# Patient Record
Sex: Female | Born: 1935 | Race: White | Hispanic: No | Smoking: Never smoker
Health system: Southern US, Community
[De-identification: ages and names within clinical notes are randomized; demographics above are authoritative.]

## PROBLEM LIST (undated history)

## (undated) DIAGNOSIS — I10 Essential (primary) hypertension: Secondary | ICD-10-CM

## (undated) DIAGNOSIS — H5440 Blindness, one eye, unspecified eye: Secondary | ICD-10-CM

## (undated) DIAGNOSIS — I639 Cerebral infarction, unspecified: Secondary | ICD-10-CM

## (undated) DIAGNOSIS — E785 Hyperlipidemia, unspecified: Secondary | ICD-10-CM

## (undated) DIAGNOSIS — G629 Polyneuropathy, unspecified: Secondary | ICD-10-CM

## (undated) HISTORY — PX: SHOULDER SURGERY: SHX246

## (undated) HISTORY — PX: EYE SURGERY: SHX253

---

## 2007-10-05 ENCOUNTER — Inpatient Hospital Stay (HOSPITAL_COMMUNITY): Admission: EM | Admit: 2007-10-05 | Discharge: 2007-10-09 | Payer: Self-pay | Admitting: Emergency Medicine

## 2007-10-06 ENCOUNTER — Encounter (INDEPENDENT_AMBULATORY_CARE_PROVIDER_SITE_OTHER): Payer: Self-pay | Admitting: Pulmonary Disease

## 2007-10-09 ENCOUNTER — Inpatient Hospital Stay: Admission: AD | Admit: 2007-10-09 | Discharge: 2008-01-20 | Payer: Self-pay | Admitting: Pulmonary Disease

## 2007-11-21 ENCOUNTER — Ambulatory Visit (HOSPITAL_COMMUNITY): Admission: RE | Admit: 2007-11-21 | Discharge: 2007-11-21 | Payer: Self-pay | Admitting: Pulmonary Disease

## 2008-02-27 ENCOUNTER — Encounter (HOSPITAL_COMMUNITY): Admission: RE | Admit: 2008-02-27 | Discharge: 2008-03-21 | Payer: Self-pay | Admitting: Pulmonary Disease

## 2008-03-25 ENCOUNTER — Encounter (HOSPITAL_COMMUNITY): Admission: RE | Admit: 2008-03-25 | Discharge: 2008-04-24 | Payer: Self-pay | Admitting: Pulmonary Disease

## 2008-04-25 ENCOUNTER — Encounter (HOSPITAL_COMMUNITY): Admission: RE | Admit: 2008-04-25 | Discharge: 2008-05-25 | Payer: Self-pay | Admitting: Pulmonary Disease

## 2008-05-07 ENCOUNTER — Emergency Department (HOSPITAL_COMMUNITY): Admission: EM | Admit: 2008-05-07 | Discharge: 2008-05-07 | Payer: Self-pay | Admitting: Emergency Medicine

## 2008-05-28 ENCOUNTER — Encounter (HOSPITAL_COMMUNITY): Admission: RE | Admit: 2008-05-28 | Discharge: 2008-06-27 | Payer: Self-pay | Admitting: Pulmonary Disease

## 2009-11-19 ENCOUNTER — Emergency Department (HOSPITAL_COMMUNITY)
Admission: EM | Admit: 2009-11-19 | Discharge: 2009-11-19 | Payer: Self-pay | Source: Home / Self Care | Admitting: Emergency Medicine

## 2010-08-04 NOTE — Group Therapy Note (Signed)
NAME:  Carla Bonilla, Carla Bonilla             ACCOUNT NO.:  [id number]   MEDICAL RECORD NO.:  [id number]          PATIENT TYPE:  INP   LOCATION:  A332                          FACILITY:  [facility]   PHYSICIAN:  Edward L. Juanetta Gosling, M.D.DATE OF BIRTH:  [birth year]   DATE OF PROCEDURE:  DATE OF DISCHARGE:                                 PROGRESS NOTE   Ms. Rone was admitted yesterday with a stroke.  She has got  basically expressive aphasia.  She clearly is able to understand.  She  gestures but she cannot speak except to make nonspecific noises.  Her  exam this morning shows temperature is 98.2, pulse 80, respirations 18,  and blood pressure 188/82.  She has not had her blood pressure  medication as yet.  Her weight 72 kg, and height 65 inches.  Her chest  is clear.  She still has some facial asymmetry.  She has a little bit of  weakness of the right side but not much and she still aphasic.  Dr.  Gerilyn Pilgrim seen her and ordered MRI, MRA and she is going to have PT,  speech, and language evaluations.  I have discussed her situation with  her niece who is her closest relative that the plan would be for her to  recover here and work with PT etc. and then trying to get her into a  Skilled Care Facility or Rehab Center.      Edward L. Juanetta Gosling, M.D.  Electronically Signed     ELH/MEDQ  D:  10/06/2007  T:  10/06/2007  Job:  [id number]

## 2010-08-04 NOTE — Consult Note (Signed)
NAME:  Carla Bonilla, Carla Bonilla             ACCOUNT NO.:  [id number]   MEDICAL RECORD NO.:  [id number]          PATIENT TYPE:  INP   LOCATION:  A332                          FACILITY:  [facility]   PHYSICIAN:  Kofi A. Gerilyn Pilgrim, M.D. DATE OF BIRTH:  Jan 13, 1921   DATE OF CONSULTATION:  10/05/2007  DATE OF DISCHARGE:                                 CONSULTATION   PRIORITY NEUROLOGICAL CONSULTATION   REASON FOR CONSULTATION:  Stroke.   HISTORY OF PRESENT ILLNESS:  The patient is an 77 year old black female,  who has no past medical history other than marked glaucoma.  The patient  was last seen about 11 p.m. by a friend.  She was noted to be at  baseline normal with a normal cognition and ambulating.  On calling the  patient this morning, she was noted to be clearly having problems  speaking and this was about 10 a.m.  On arrival to the house, the friend  noticed that the patient had facial drooping on the right and was  subsequently taken to the emergency room.   PAST MEDICAL HISTORY:  Glaucoma; she is legally blind in her left eye.  Apparently she saw doctors, I believe, at [facility] for her glaucoma  problems.  No other past medical history.   MEDICATIONS:  1. Lopressor 50 mg once a day.  2. Acetazolamide 250 mg b.i.d.  3. Eye drops the right eye b.i.d.  4. Vigamox opthalmic solution 0.5% into the left eye b.i.d.   REVIEW OF SYSTEMS:  Limited given a profound speaking impairment, but at  baseline she functions fairly well and essentially per the friend the  review of systems is negative.  She does apparently have some problems  walking around due to knee pain at times.   SOCIAL HISTORY:  Lives by herself.  She has some nieces and nephews in  [place], but otherwise no other family members, no children.  No tobacco  use, no alcohol use, and no illicit drug use.   PAST SURGICAL HISTORY:  Unknown.   ALLERGIES:  No known drug allergies.   PHYSICAL EXAMINATION:  GENERAL:  She was a pleasant,  moderately  overweight lady in no acute distress.  VITAL SIGNS:  Temperature 97.4, pulse 66, respirations 18, blood  pressure 161/86.  HEENT:  Patient has corneal clouding on the left eye, pupils are both  irregularly shaped and large, 6 mm, and both are nonreactive.  Head is  normocephalic, atraumatic.  NECK:  Supple.  ABDOMEN:  Soft.  EXTREMITIES:  No significant edema.  MENTATION:  She is awake and alert.  She does follow midline commands  only.  She has a clear global aphasia involving both comprehension and  expression.  She essentially is mute.  CRANIAL NERVE EVALUATION:  Again, pupils are irregularly shaped, large,  and nonreactive.  She does have full extraocular movements, and no  nystagmus is noted.  She has right lower facial weakness.  Tongue is  midline. Visual fields are unreliable, but I do not really see any clear  deficits there.  MOTOR EXAMINATION:  She has a pronator drift on the right  side with  increased tone involving the right upper extremity.  She seems to have  normal tone, bulk, and strength involving the left side.  Reflexes are  brisk on the right side, although plantar.  Reflexes are both flexor.  Coordination shows no tremor.  There is no past-pointing or dysmetria.   CT scan of the brain shows nothing acute.  There is a hyperdense sign  involving the left MCA suggestive of an acute left MCA infarct.  There  is a subcortical periventricular hypodensity indicating chronic ischemic  changes.   WBC 8.6, hemoglobin 14, platelet count of 208.  Urinalysis negative.  Sodium 141, potassium 3.6, chloride of 114, CO2 21, glucose 148, BUN 12,  creatinine 1, calcium 9.2.   IMPRESSION:  Acute left middle cerebral artery infarct involving the  frontotemporal region; in particular, the operculum given the severe  language impairment.   RECOMMENDATIONS:  Continue with aspirin and Plavix, although she will  not need both of these in the long term.  This only  increases the risk  of bleeding without significant benefit.  She probably can be discharged  home on one, preferably the aspirin.  We are going to start her on DVT  prevention in the way of compression stockings.  We are also going to do  an MRI and MRA of the brain.  She already has speech and echo ordered,  and a carotid is also ordered, which I think came back negative, there  is no significant stenosis.  Thanks for this consultation.      Kofi A. Gerilyn Pilgrim, M.D.  Electronically Signed     KAD/MEDQ  D:  10/05/2007  T:  10/05/2007  Job:  [id number]

## 2010-08-04 NOTE — Group Therapy Note (Signed)
NAME:  Carla Bonilla, Carla Bonilla             ACCOUNT NO.:  [id number]   MEDICAL RECORD NO.:  [id number]          PATIENT TYPE:  INP   LOCATION:  A332                          FACILITY:  [facility]   PHYSICIAN:  Kofi A. Gerilyn Pilgrim, M.D. DATE OF BIRTH:  1920/11/03   DATE OF PROCEDURE:  DATE OF DISCHARGE:                                 PROGRESS NOTE   PHYSICAL EXAMINATION:  Temperature 98.4, pulse 63, respirations 20,  blood pressure 160/76.  Patient is awake and alert, sitting up in the bedroom.  She does follow  midline commands but still is severely impaired language-wise.  She  still has significant problems following commands on most things.  She  essentially is mute.  She has flattening of the nasolabial fold on the  right with mild right pronator drift.   MRI of the brain shows mild-to-moderate intracranial occlusive disease  involving the MCA bilaterally.  There is acute left MCA infarct  involving the left frontal temporoparietal region.  There is also  bilateral small amount of diffusion hyperintensities seen at the  occipital regions.   ASSESSMENT:  Acute left middle cerebral artery infarction.  There is  some suggestion of small bioccipital infarctions suggestive of coronary  embolic phenomenon.  I do not see an echocardiogram report as yet.  Will  follow that up.  Continue with aspirin and Plavix again until an echo  indicates a cardioembolic stroke, then antiplatelet agents should be  given.  I would suggest using only one antiplatelet agent long-term.  Will continue to follow the echo report and continue her current care.      Kofi A. Gerilyn Pilgrim, M.D.  Electronically Signed     KAD/MEDQ  D:  10/06/2007  T:  10/06/2007  Job:  [id number]

## 2010-08-04 NOTE — Group Therapy Note (Signed)
NAME:  Carla Bonilla, Carla Bonilla             ACCOUNT NO.:  [id number]   MEDICAL RECORD NO.:  [id number]         PATIENT TYPE:  PORB   LOCATION:  S115                          FACILITY:  [facility]   PHYSICIAN:  Edward L. Juanetta Gosling, M.D.DATE OF BIRTH:  [birth year]   DATE OF PROCEDURE:  DATE OF DISCHARGE:                                 PROGRESS NOTE   Carla Bonilla has overall much improved.  She is able to speak and I am  able to understand more of her words.  She has some skin tears on her  arm, but those seem to be a little bit better.  She has no other new  complaints and no other new problems.  Overall, I think she is doing  much better.   ASSESSMENT:  She is improved.   PLAN:  Plan is to continue with her treatments and medications.  Continue with her therapy.  She does seem to be improving.  I do not  plan to change anything and hopefully, she will get to the point where  she is going to be able to live independently again, but I am not sure  if this is going to happen.      Edward L. Juanetta Gosling, M.D.  Electronically Signed     ELH/MEDQ  D:  12/30/2007  T:  12/30/2007  Job:  [id number]

## 2010-08-04 NOTE — H&P (Signed)
NAME:  Carla Bonilla, Carla Bonilla             ACCOUNT NO.:  [id number]   MEDICAL RECORD NO.:  [id number]          PATIENT TYPE:  INP   LOCATION:  A332                          FACILITY:  [facility]   PHYSICIAN:  Edward L. Juanetta Gosling, M.D.DATE OF BIRTH:  08-08-1920   DATE OF ADMISSION:  10/05/2007  DATE OF DISCHARGE:  LH                              HISTORY & PHYSICAL   REASON FOR ADMISSION:  Stroke.   HISTORY:  Carla Bonilla is an 77 year old who had been in her usual  state of fairly good health, had been out moving around as normal during  the day yesterday, was last seen or was last heard from by a family  friend about 10 o'clock on the night of October 04, 2007, and then this  morning was found to be very sluggish.  Her speech was slurred, and she  was brought to the Emergency Room where she was found to have what  appears to be a stroke.  She has not had anything like this previously  and has generally been in good health.  She has hypertension and she has  problems with her left eye.  She has been blind in that left eye for  many years and she has glaucoma in the right eye.  She has noted that  she has weakness on the right side of her face.  She is aphasic.  She  seems to be able to understand, but she is not able to speak.   Her past medical history is positive for glaucoma, positive for  hypertension.  Surgically, she has had surgery on her eye.   SOCIAL HISTORY:  She does not smoke.  She does not use any alcohol.  She  does not use any illicit drugs and she is a widow.   Her family history is not positive for any sort of strokes as far as is  known.   Her current medications are:  1. Lopressor 50 mg daily.  2. Acetazolamide 250 mg b.i.d.  3. Timolol 0.5%, 1 drop twice a day in the right eye.  4. Vigamox 0.5%, 1 drop twice a day in the eye.   Her review of systems except as mentioned is negative.   Physical exam shows that she is awake and alert.  She does have facial  asymmetry.  She  is not able to speak, but she can make sounds.  Her  temperature is 97.4, pulse 68, respirations 18, blood pressure 161/86,  and O2 sat 97% on room air.  Her height 65 inches and weight 72 kg.  Her  pupils, right pupil is reactive.  Her nose and throat are clear.  Her  neck is supple.  She does not have any jugular venous distention.  She  does not have any bruits.  Her chest is clear without wheezes, rales, or  rhonchi.  Her heart is regular without murmur, gallop, or rub.  Her  abdomen is soft.  No masses felt.  Extremities showed no edema.  Central  nervous system exam shows that she is unable to communicate verbally.  She has perhaps some decreased strength  in the right arm and in the  right leg, but she cannot move all 4 extremities on command.  She has a  poor gag reflex, but does have a gag reflex.   Electrocardiogram done showed apparently sinus rhythm, nonspecific ST-T  wave changes and her lab work.  CBC shows white count 8600, hemoglobin  14.3, platelets 208, PT 12.5, INR 0.9, metabolic profile shows her  glucose is 148, BUN 20, and creatinine 1.03.  Liver function is normal.  Urine showed 3-6 white cells and 0-2 red cells.  She has a Foley  catheter in place and she is having some blood tinged urine now.  She  had CT of the brain which showed diffuse brain atrophy, chronic  microvascular ischemic changes, and a left middle cerebral artery  infarction.  Carotid Doppler, she has minimal atherosclerotic change.   ASSESSMENT:  She has had a stroke and clearly we are going to need to do  neuro checks, watch her carefully, Dr.  Gerilyn Pilgrim will see her.  She is  going to have PT, OT, and speech consultations and she is going to be  followed closely.  I am going to get neuro checks every 4 hours for the  first 24 hours and then follow.      Edward L. Juanetta Gosling, M.D.  Electronically Signed     ELH/MEDQ  D:  10/05/2007  T:  10/06/2007  Job:  [id number]

## 2010-08-04 NOTE — Group Therapy Note (Signed)
NAME:  Carla Bonilla, Carla Bonilla             ACCOUNT NO.:  [id number]   MEDICAL RECORD NO.:  [id number]          PATIENT TYPE:  INP   LOCATION:  A332                          FACILITY:  [facility]   PHYSICIAN:  Kofi A. Gerilyn Pilgrim, M.D. DATE OF BIRTH:  Sep 15, 1920   DATE OF PROCEDURE:  10/09/2007  DATE OF DISCHARGE:                                 PROGRESS NOTE   The family is here, and did have a conversation with them.  They did  report some improved neurological function.  They report that she was  able over the last couple of days to write her name which I did observe.  Temperature 98, pulse 71, respiratory rate 20, blood pressure 116/67.  The patient is awake.  She actually is more responsive, and does follow  appendicular commands bilaterally.  She does seem to have some  right/left confusion, however, which is not unusual, given her left  parietal central infarct.  She has anti-gravity  strength bilaterally.  She continues to have flattening of the nasolabial fold on the right.   ASSESSMENT AND PLAN:  Large left frontoparietal infarct.  Continue with  the current therapy including antiplatelet agents and occupational,  physical, and speech therapy.  Again I think we should DC the Plavix  after a month, and she should continue with only a single antiplatelet  agent.      Kofi A. Gerilyn Pilgrim, M.D.  Electronically Signed     KAD/MEDQ  D:  10/09/2007  T:  10/09/2007  Job:  [id number]

## 2010-08-04 NOTE — H&P (Signed)
NAME:  Carla Bonilla, Carla Bonilla             ACCOUNT NO.:  [id number]   MEDICAL RECORD NO.:  [id number]          PATIENT TYPE:  ORB   LOCATION:  S115                          FACILITY:  [facility]   PHYSICIAN:  Edward L. Juanetta Gosling, M.D.DATE OF BIRTH:  Jul 20, 1920   DATE OF ADMISSION:  10/09/2007  DATE OF DISCHARGE:  LH                              HISTORY & PHYSICAL   REASON FOR ADMISSION/HISTORY:  Ms. Brucato is an 77 year old who had  been in her usual state of fairly good health on October 05, 2007, when she  had change in mental status and was brought to the emergency room.  Her  speech was slurred, she fell, she was very weak on the right side.  She  was brought to the emergency room where she was found to be aphasic, had  weakness on the right side of her face, weakness on the right side of  her body.  This was an expressive aphasia.  She does not have receptive  aphasia.  She was treated at the hospital, showed some improvement and  is brought to [facility] for rehabilitation.   PAST MEDICAL HISTORY:  1. Glaucoma.  2. Hypertension.  3. Surgically on her eye several times.  She is blind in the left eye.   FAMILY HISTORY:  As far as is known is not positive for strokes.   REVIEW OF SYSTEMS:  Except as mentioned is negative.   PHYSICAL EXAMINATION:  GENERAL:  Shows that she is alert and awake.  She  is trying to express herself with gesture.  She can make a few words.  Her weakness on the right is better.  She still has facial asymmetry.  VITAL SIGNS:  As recorded.  HEENT:  Mucous membranes are moist.  NECK:  Supple without masses.  CHEST:  Fairly clear without wheezes, rales, or rhonchi.  HEART:  Regular without murmur, gallop, or rub.  ABDOMEN:  Soft without masses.  EXTREMITIES:  No edema.  CNS:  She has very poor verbal communication skills.  She still has some  right-sided facial asymmetry.  The weakness in her right leg is better.   ASSESSMENT:  She has got multiple medical  problems, most recently a  stroke.  She does seem to be improving.  She is not ready for discharge  yet.      Edward L. Juanetta Gosling, M.D.  Electronically Signed     ELH/MEDQ  D:  10/16/2007  T:  10/16/2007  Job:  [id number]

## 2010-08-04 NOTE — Group Therapy Note (Signed)
NAME:  Carla, Bonilla             ACCOUNT NO.:  [id number]   MEDICAL RECORD NO.:  [id number]          PATIENT TYPE:  ORB   LOCATION:  S115                          FACILITY:  [facility]   PHYSICIAN:  Edward L. Juanetta Gosling, M.D.DATE OF BIRTH:  04/09/1920   DATE OF PROCEDURE:  DATE OF DISCHARGE:                                 PROGRESS NOTE   Carla Bonilla is much improved.  She still has facial asymmetry and her  speech, although still difficult to understand, is much better than  before.  She is able to communicate and she does use some short  sentences that we are able to understand now.  She has no other new  complaints noted.   PHYSICAL EXAMINATION:  GENERAL:  She is awake.  She is alert.  She is  sitting up.  HEENT:  She does have facial asymmetry.  Her speech is difficult but  understandable.  HEART:  Regular.  CHEST:  Clear.  ABDOMEN:  Soft.  EXTREMITIES:  No edema.  CNS:  Clearly she can understand, although she has some difficulty still  with communication.   ASSESSMENT:  She has had a serious stroke, but she seems to be  improving.  She has expressive aphasia, which is improving and my plan  then is to continue with her treatments and medications and follow.  I  do not plan to change any meds today.  She is going to continue with her  speech therapy, etc. and follow.      Edward L. Juanetta Gosling, M.D.  Electronically Signed     ELH/MEDQ  D:  11/04/2007  T:  11/05/2007  Job:  [id number]

## 2010-08-04 NOTE — Group Therapy Note (Signed)
NAME:  ALEGRIA, DOMINIQUE             ACCOUNT NO.:  [id number]   MEDICAL RECORD NO.:  [id number]          PATIENT TYPE:  ORB   LOCATION:  S115                          FACILITY:  [facility]   PHYSICIAN:  Edward L. Juanetta Gosling, M.D.DATE OF BIRTH:  05-Aug-1920   DATE OF PROCEDURE:  DATE OF DISCHARGE:                                 PROGRESS NOTE   The patient at the [facility].  Ms. Odis Luster wood is overall doing well.  She is participating in activities and her speech continues to improve,  although she still has some speech impediment.  She still has some  asymmetry of her face.   Her physical examination this morning shows that she is awake and alert.  She is able to converse.  Her vital signs as recorded.  Her nose and  throat are clear.  Her mucous membranes are moist.   ASSESSMENT:  She has had a severe stroke which is left with problems  with speech, but basically she is doing quite well.  I do not plan to  change any of her treatments.  She is continuing to work with PT, OT,  and speech therapy and continues to improve.      Edward L. Juanetta Gosling, M.D.  Electronically Signed     ELH/MEDQ  D:  12/02/2007  T:  12/02/2007  Job:  [id number]

## 2010-08-04 NOTE — Discharge Summary (Signed)
NAME:  Carla Bonilla, Carla Bonilla             ACCOUNT NO.:  [id number]   MEDICAL RECORD NO.:  [id number]          PATIENT TYPE:  INP   LOCATION:  A332                          FACILITY:  [facility]   PHYSICIAN:  Kingsley Callander. Ouida Sills, MD       DATE OF BIRTH:  05/12/1920   DATE OF ADMISSION:  10/05/2007  DATE OF DISCHARGE:  LH                               DISCHARGE SUMMARY   DISCHARGE DIAGNOSES:  1. Stroke.  2. Hypertension.  3. Glaucoma.   HOSPITAL COURSE:  This patient is an 77 year old female who presented  with sluggishness, slurred speech, and right-sided weakness.  She was in  normal sinus rhythm.  Her initial BP was 161/86.  A CT and MRI revealed  a left middle cerebral artery distribution stroke.  There are also  occipital changes raising a suspicion of an embolic phenomenon.  Her  echo, though, revealed no clot and no wall motion abnormalities.  LV  function was normal.  She remained in sinus rhythm.  She was seen in  neurology consultation by Dr. Gerilyn Pilgrim.  She was treated with aspirin  and Plavix.  Zocor and Altace were added.   She is aphasic.  She has been evaluated treated with speech therapy.  Medications have been crushed in puree.  She was found to be a silent  aspirator of thin liquids.  Her diet will be nectar thickened liquids  and pureed foods.   Her carotid ultrasound revealed no significant stenosis.  She has a  persistent right facial droop and diminished strength in the right upper  extremity.   Her cholesterol was 159 with an LDL of 106.   She was stable and arrangements have been made for transfer to the [facility].  She will continue physical therapy and speech therapy there.   DISCHARGE MEDICATIONS:  1. Aspirin 81 mg daily.  2. Plavix 75 mg daily.  3. Altace 2.5 mg daily.  4. Zocor 20 mg nightly.  5. Lopressor 25 mg b.i.d.  6. Acetazolamide 250 mg twice a day.  7. Timolol 0.5% 1 drop twice a day in the right eye.  8. Vigamox 0.5% 1 drop in the left eye twice  dail    Please check a met7 in 1 week.      Kingsley Callander. Ouida Sills, MD  Electronically Signed     ROF/MEDQ  D:  10/09/2007  T:  10/09/2007  Job:  [id number]   cc:   Ramon Dredge L. Juanetta Gosling, M.D.  Fax: [phone]

## 2010-11-20 ENCOUNTER — Emergency Department (HOSPITAL_COMMUNITY): Payer: Medicare Other

## 2010-11-20 ENCOUNTER — Inpatient Hospital Stay (HOSPITAL_COMMUNITY)
Admission: EM | Admit: 2010-11-20 | Discharge: 2010-11-27 | DRG: 494 | Disposition: A | Discharge status: Skilled Nursing Facility | Payer: Medicare Other | Attending: Pulmonary Disease | Admitting: Pulmonary Disease

## 2010-11-20 DIAGNOSIS — I10 Essential (primary) hypertension: Secondary | ICD-10-CM | POA: Diagnosis present

## 2010-11-20 DIAGNOSIS — W19XXXA Unspecified fall, initial encounter: Secondary | ICD-10-CM | POA: Diagnosis present

## 2010-11-20 DIAGNOSIS — S42401A Unspecified fracture of lower end of right humerus, initial encounter for closed fracture: Secondary | ICD-10-CM | POA: Diagnosis present

## 2010-11-20 DIAGNOSIS — H409 Unspecified glaucoma: Secondary | ICD-10-CM | POA: Diagnosis present

## 2010-11-20 DIAGNOSIS — I6930 Unspecified sequelae of cerebral infarction: Secondary | ICD-10-CM

## 2010-11-20 DIAGNOSIS — S42309A Unspecified fracture of shaft of humerus, unspecified arm, initial encounter for closed fracture: Secondary | ICD-10-CM

## 2010-11-20 DIAGNOSIS — D500 Iron deficiency anemia secondary to blood loss (chronic): Secondary | ICD-10-CM | POA: Diagnosis not present

## 2010-11-20 DIAGNOSIS — T148XXA Other injury of unspecified body region, initial encounter: Secondary | ICD-10-CM

## 2010-11-20 DIAGNOSIS — S42293A Other displaced fracture of upper end of unspecified humerus, initial encounter for closed fracture: Principal | ICD-10-CM | POA: Diagnosis present

## 2010-11-20 DIAGNOSIS — I69928 Other speech and language deficits following unspecified cerebrovascular disease: Secondary | ICD-10-CM

## 2010-11-20 HISTORY — DX: Blindness, one eye, unspecified eye: H54.40

## 2010-11-20 HISTORY — DX: Cerebral infarction, unspecified: I63.9

## 2010-11-20 HISTORY — DX: Essential (primary) hypertension: I10

## 2010-11-20 LAB — CBC
MCH: 31.5 pg (ref 26.0–34.0)
MCV: 94.1 fL (ref 78.0–100.0)
Platelets: 271 10*3/uL (ref 150–400)
RDW: 14.1 % (ref 11.5–15.5)
WBC: 23.7 10*3/uL — ABNORMAL HIGH (ref 4.0–10.5)

## 2010-11-20 LAB — URINALYSIS, ROUTINE W REFLEX MICROSCOPIC
Ketones, ur: NEGATIVE mg/dL
Leukocytes, UA: NEGATIVE
Nitrite: NEGATIVE
Protein, ur: NEGATIVE mg/dL

## 2010-11-20 LAB — BASIC METABOLIC PANEL
Calcium: 9.2 mg/dL (ref 8.4–10.5)
Chloride: 103 mEq/L (ref 96–112)
Creatinine, Ser: 0.85 mg/dL (ref 0.50–1.10)
GFR calc Af Amer: >60 mL/min (ref >60)

## 2010-11-20 MED ORDER — METOPROLOL TARTRATE 25 MG PO TABS
25.0000 mg | ORAL_TABLET | Freq: Two times a day (BID) | ORAL | Status: DC
  Administered 2010-11-20 – 2010-11-27 (×13): 25 mg via ORAL
  Filled 2010-11-20 (×14): qty 1

## 2010-11-20 MED ORDER — TIMOLOL MALEATE 0.25 % OP SOLN
1.0000 [drp] | Freq: Two times a day (BID) | OPHTHALMIC | Status: DC
  Administered 2010-11-21 – 2010-11-27 (×12): 1 [drp] via OPHTHALMIC
  Filled 2010-11-20: qty 5

## 2010-11-20 MED ORDER — AMLODIPINE BESYLATE 5 MG PO TABS: 5.0000 mg | ORAL_TABLET | Freq: Every day | ORAL | Status: DC

## 2010-11-20 MED ORDER — SODIUM CHLORIDE 0.9 % IJ SOLN
INTRAMUSCULAR | Status: AC
  Administered 2010-11-20: 3 mL
  Filled 2010-11-20: qty 3

## 2010-11-20 MED ORDER — ONDANSETRON HCL 4 MG PO TABS
4.0000 mg | ORAL_TABLET | Freq: Once | ORAL | Status: AC
  Administered 2010-11-20: 4 mg via ORAL
  Filled 2010-11-20: qty 1

## 2010-11-20 MED ORDER — PNEUMOCOCCAL VAC POLYVALENT 25 MCG/0.5ML IJ INJ
0.5000 mL | INJECTION | INTRAMUSCULAR | Status: AC
  Administered 2010-11-21: 0.5 mL via INTRAMUSCULAR
  Filled 2010-11-20: qty 0.5

## 2010-11-20 MED ORDER — MOXIFLOXACIN HCL 0.5 % OP SOLN
1.0000 [drp] | Freq: Two times a day (BID) | OPHTHALMIC | Status: DC
  Filled 2010-11-20: qty 3

## 2010-11-20 MED ORDER — OXYCODONE-ACETAMINOPHEN 5-325 MG PO TABS
1.0000 | ORAL_TABLET | Freq: Once | ORAL | Status: AC
  Administered 2010-11-20: 1 via ORAL
  Filled 2010-11-20: qty 1

## 2010-11-20 MED ORDER — AMLODIPINE BESYLATE 5 MG PO TABS: 20.0000 mg | ORAL_TABLET | Freq: Every day | ORAL | Status: DC

## 2010-11-20 MED ORDER — MORPHINE SULFATE 2 MG/ML IJ SOLN
1.0000 mg | INTRAMUSCULAR | Status: DC | PRN
  Administered 2010-11-20 – 2010-11-23 (×2): 1 mg via INTRAVENOUS
  Filled 2010-11-20 (×2): qty 1

## 2010-11-20 NOTE — ED Notes (Signed)
Patient is an elderly female with a history of stroke affecting her right side who fell this morning falling on her right shoulder. This was acute in onset, associated with transfer of her weight while turning around while dressing. She was unable to get up off the floor causing family to call EMS to help with transport to the hospital. She denies hitting her head, denies neck pain, denies back pain, denies leg pain other than her chronic right lower extremity pain. She notes that her right upper extremity has been swollen, tender and worse with movement.  Physical exam: Patient is well appearing, no trauma to the head or the neck, no tenderness to palpation over her cervical, thoracic, lumbar spines. She does have tenderness to her right shoulder and with range of motion of the shoulder. There is swelling of the proximal humeral area. There is normal range of motion of the elbow, forearm, wrist on the right. She does have a skin tear on the denser surface of the forearm which is very superficial and not amenable to laceration repair. Mental status is normal, sensation normal, motor normal or baseline.  Assessment, 77 year old with likely humerus fracture.  Plan: Evaluation with x-rays, labs, anticipate immobilization with a sling.  Vida Roller, MD 11/20/10 [phone]

## 2010-11-20 NOTE — Progress Notes (Signed)
Medical screening examination/treatment/procedure(s) were conducted as a shared visit with non-physician practitioner(s) and myself.  I personally evaluated the patient during the encounter  

## 2010-11-20 NOTE — ED Notes (Signed)
Per ems, pt off of her bed and landed on her rt arm.  Pt denies any LOC, or head injury.  Pt is alert and oriented

## 2010-11-20 NOTE — ED Notes (Signed)
Called and gave report to RN on 300

## 2010-11-20 NOTE — Consult Note (Signed)
Reason for Consult:right humerus fracture Referring Physician: ER/PA Ivery Quale  Carla Bonilla is an 77 y.o. female.  HPI: 89 HTN, S/P CVA WITH RIGHT SIDED LOWER EXTREMITY HEMIPARESIS, POOR BALANCE, AMBULATES WITH WALKER, FELL FRACTURED RIGHT HUMERUS PROXIMALLY, COMMINUTED, AXIALLY ALIGNED REASONABLY WITH SEPARATION OF FRAGMENTS  Past Medical History  Diagnosis Date  . CVA (cerebral vascular accident)   . Hypertension   . Blind left eye   . Glaucoma   . Hearing loss     History reviewed. No pertinent past surgical history.  No family history on file.  Social History:  reports that she has never smoked. She does not have any smokeless tobacco history on file. She reports that she does not drink alcohol or use illicit drugs.  Allergies: Not on File  Medications: I have reviewed the patient's current medications.  Results for orders placed during the hospital encounter of 11/20/10 (from the past 48 hour(s))  CBC     Status: Abnormal   Collection Time   11/20/10  1:29 PM      Component Value Range Comment   WBC 23.7 (*) 4.0 - 10.5 (K/uL)    RBC 3.87  3.87 - 5.11 (MIL/uL)    Hemoglobin 12.2  12.0 - 15.0 (g/dL)    HCT 16.1  09.6 - 04.5 (%)    MCV 94.1  78.0 - 100.0 (fL)    MCH 31.5  26.0 - 34.0 (pg)    MCHC 33.5  30.0 - 36.0 (g/dL)    RDW 40.9  81.1 - 91.4 (%)    Platelets 271  150 - 400 (K/uL)   BASIC METABOLIC PANEL     Status: Abnormal   Collection Time   11/20/10  1:29 PM      Component Value Range Comment   Sodium 139  135 - 145 (mEq/L)    Potassium 4.4  3.5 - 5.1 (mEq/L)    Chloride 103  96 - 112 (mEq/L)    CO2 29  19 - 32 (mEq/L)    Glucose, Bld 154 (*) 70 - 99 (mg/dL)    BUN 31 (*) 6 - 23 (mg/dL)    Creatinine, Ser 7.82  0.50 - 1.10 (mg/dL)    Calcium 9.2  8.4 - 10.5 (mg/dL)    GFR calc non Af Amer >60  >60 (mL/min)    GFR calc Af Amer >60  >60 (mL/min)   URINALYSIS, ROUTINE W REFLEX MICROSCOPIC     Status: Normal   Collection Time   11/20/10  3:05 PM    Component Value Range Comment   Color, Urine YELLOW  YELLOW     Appearance CLEAR  CLEAR     Specific Gravity, Urine 1.025  1.005 - 1.030     pH 6.5  5.0 - 8.0     Glucose, UA NEGATIVE  NEGATIVE (mg/dL)    Hgb urine dipstick NEGATIVE  NEGATIVE     Bilirubin Urine NEGATIVE  NEGATIVE     Ketones, ur NEGATIVE  NEGATIVE (mg/dL)    Protein, ur NEGATIVE  NEGATIVE (mg/dL)    Urobilinogen, UA 0.2  0.0 - 1.0 (mg/dL)    Nitrite NEGATIVE  NEGATIVE     Leukocytes, UA NEGATIVE  NEGATIVE  MICROSCOPIC NOT DONE ON URINES WITH NEGATIVE PROTEIN, BLOOD, LEUKOCYTES, NITRITE, OR GLUCOSE <1000 mg/dL.    Dg Chest 1 View  11/20/2010  *RADIOLOGY REPORT*  Clinical Data: Humeral fracture, fall  CHEST - 1 VIEW  Comparison: None  Findings: Upper normal heart size. Atherosclerotic  calcification aorta. Pulmonary vascularity normal. Subsegmental atelectasis left base. Lungs otherwise clear. No pleural effusion or pneumothorax. Comminuted significantly displaced fracture of the proximal right humeral metadiaphysis. No additional fractures seen.  IMPRESSION: Minimal subsegmental atelectasis left base. Comminuted significantly displaced proximal right humeral metadiaphyseal fracture.  Original Report Authenticated By: Lollie Marrow, M.D.   Dg Shoulder Right  11/20/2010  *RADIOLOGY REPORT*  Clinical Data: Pain post fall, history of stroke affecting right side of body  RIGHT SHOULDER - 2+ VIEW  Comparison: None  Findings: Osseous demineralization. AC joint alignment normal. Comminuted significantly displaced fracture of the proximal right humeral metadiaphysis. Fracture extends into the base of the right humeral head. No dislocation identified. Question additional fragment from the margin of the humeral head on the scapular Y view.  IMPRESSION: Comminuted displaced proximal right humeral fracture as above without dislocation.  Original Report Authenticated By: Lollie Marrow, M.D.   Dg Forearm Right  11/20/2010  *RADIOLOGY REPORT*   Clinical Data: Pain, scrapes and bruising at right forearm post fall  RIGHT FOREARM - 2 VIEW  Comparison: None  Findings: Diffuse osseous demineralization. Wrist and elbow joint alignments normal. No acute fracture, dislocation, or bone destruction.  IMPRESSION: No acute osseous abnormalities.  Original Report Authenticated By: Lollie Marrow, M.D.   Dg Hip Complete Right  11/20/2010  *RADIOLOGY REPORT*  Clinical Data: Right hip pain post fall  RIGHT HIP - COMPLETE 2+ VIEW  Comparison: None  Findings: Diffuse osseous demineralization. Degenerative disc and facet disease changes lower lumbar spine. Levoconvex scoliosis noted. Symmetric SI joints. Mild left hip joint space narrowing. Marked degenerative changes of the right hip joint with joint space obliteration, subchondral sclerosis with minimal cyst formation, extensive spur formation, and superolateral subluxation of the right femoral head. Right femoral head is flattened and deformed. No definite acute fracture or dislocation identified. Prominent stool in rectum. Difficult to exclude abnormalities at the pubic rami due to the degree of demineralization and superimposed stool.  IMPRESSION: Advanced osteoarthritic changes of the right hip joint as above. Degenerative disc and facet disease changes of lumbar spine with levoconvex scoliosis. No definite acute bony abnormality identified.  Original Report Authenticated By: Lollie Marrow, M.D.   Ct Head Wo Contrast  11/20/2010  *RADIOLOGY REPORT*  Clinical Data:  Fall.  Head and neck pain.  CT HEAD WITHOUT CONTRAST CT CERVICAL SPINE WITHOUT CONTRAST  Technique:  Multidetector CT imaging of the head and cervical spine was performed following the standard protocol without intravenous contrast.  Multiplanar CT image reconstructions of the cervical spine were also generated.  Comparison:  05/07/2008 [facility] hospital.  CT HEAD  Findings: No skull fracture or intracranial hemorrhage.  Remote infarct with  encephalomalacia left frontal lobe and right parietal lobe.  Prominent small vessel disease type changes. No CT evidence of large acute infarct.  Small acute infarct cannot be excluded by CT.  Age related atrophy without hydrocephalus.  Small vessel disease type changes.  No intracranial mass lesion detected on this unenhanced exam.  Vascular calcifications.  Tiny radiopaque structure posterior aspect of the left globe unchanged.  Exophthalmos.  IMPRESSION: No skull fracture or intracranial hemorrhage.  Remote infarcts and small vessel disease type changes as detailed above.  CT CERVICAL SPINE  Findings: No cervical spine fracture.  Anterior slip of C3 as noted previously.  Cervical spondylotic changes with various degrees of spinal stenosis and foraminal narrowing most prominent on the right at the C5-6 level.  No abnormal prevertebral soft tissue  swelling.  IMPRESSION: No cervical spine fracture.  Cervical spondylotic changes as noted above.  Original Report Authenticated By: Fuller Canada, M.D.   Ct Cervical Spine Wo Contrast  11/20/2010  *RADIOLOGY REPORT*  Clinical Data:  Fall.  Head and neck pain.  CT HEAD WITHOUT CONTRAST CT CERVICAL SPINE WITHOUT CONTRAST  Technique:  Multidetector CT imaging of the head and cervical spine was performed following the standard protocol without intravenous contrast.  Multiplanar CT image reconstructions of the cervical spine were also generated.  Comparison:  05/07/2008 [facility] hospital.  CT HEAD  Findings: No skull fracture or intracranial hemorrhage.  Remote infarct with encephalomalacia left frontal lobe and right parietal lobe.  Prominent small vessel disease type changes. No CT evidence of large acute infarct.  Small acute infarct cannot be excluded by CT.  Age related atrophy without hydrocephalus.  Small vessel disease type changes.  No intracranial mass lesion detected on this unenhanced exam.  Vascular calcifications.  Tiny radiopaque structure posterior  aspect of the left globe unchanged.  Exophthalmos.  IMPRESSION: No skull fracture or intracranial hemorrhage.  Remote infarcts and small vessel disease type changes as detailed above.  CT CERVICAL SPINE  Findings: No cervical spine fracture.  Anterior slip of C3 as noted previously.  Cervical spondylotic changes with various degrees of spinal stenosis and foraminal narrowing most prominent on the right at the C5-6 level.  No abnormal prevertebral soft tissue swelling.  IMPRESSION: No cervical spine fracture.  Cervical spondylotic changes as noted above.  Original Report Authenticated By: Fuller Canada, M.D.    ROS Blood pressure 124/57, pulse 58, temperature 97.4 F (36.3 C), temperature source Oral, resp. rate 20, SpO2 97.00%. Physical Exam  Assessment/Plan: VERY BAD PROXIMAL HUMERUS FRACTURE, WILL DISCUSS WITH FAMILY REF. SURGICAL FIXATION   Fuller Canada 11/20/2010, 5:09 PM

## 2010-11-20 NOTE — ED Provider Notes (Signed)
Medical screening examination/treatment/procedure(s) were conducted as a shared visit with non-physician practitioner(s) and myself.  I personally evaluated the patient during the encounter   Vida Roller, MD 11/20/10 2255

## 2010-11-20 NOTE — ED Notes (Signed)
Patients family states patient takes her pills in yogurt and she has a contact in her left eye.

## 2010-11-20 NOTE — ED Notes (Signed)
Pt in room on spine board.  Pt is alert and oriented.  Pt has small lac on her left elbow, and c/o of severe pain to her rt arm.  Pt is able to move her fingers but unable to move her forearm.

## 2010-11-20 NOTE — ED Notes (Signed)
Pt to CT

## 2010-11-20 NOTE — Progress Notes (Signed)
Case discussed with Dr Mort Sawyers. He has agreed to consult. He request pt be placed in an immobilizer.

## 2010-11-20 NOTE — ED Notes (Signed)
Assisted PA with removal of pt from spine board.

## 2010-11-20 NOTE — ED Provider Notes (Signed)
History     CSN: [id number] Arrival date & time: 11/20/2010 11:09 AM  Chief Complaint  Patient presents with  . Fall   HPI Comments: Family reports patient had a stroke about 3 years ago an is unsteady on her feet. She was standing and twisting without her walker while getting ready for a hair appointment when she fell. Family report she loss her balance. No LOC. No c/o chest pain, palpitations or headache. Family noted injury to the left upper ext, and call EMS. It is also of note that the patient is on plavix an asa. Pt has some difficulty communicating  Since the stroke per family.  Patient is a 77 y.o. female presenting with fall. The history is provided by the patient, a relative and the EMS personnel.  Fall The accident occurred 1 to 2 hours ago. The fall occurred while standing. She landed on carpet. The point of impact was the right shoulder. Pertinent negatives include no abdominal pain and no hematuria.    Past Medical History  Diagnosis Date  . CVA (cerebral vascular accident)   . Hypertension   . Blind left eye   . Glaucoma   . Hearing loss     History reviewed. No pertinent past surgical history.  No family history on file.  History  Substance Use Topics  . Smoking status: Never Smoker   . Smokeless tobacco: Not on file  . Alcohol Use: No    OB History    Grav Para Term Preterm Abortions TAB SAB Ect Mult Living                  Review of Systems  Constitutional: Negative for activity change.       All ROS Neg except as noted in HPI  HENT: Negative for nosebleeds and neck pain.   Eyes: Negative for photophobia and discharge.  Respiratory: Negative for cough, shortness of breath and wheezing.   Cardiovascular: Negative for chest pain and palpitations.  Gastrointestinal: Negative for abdominal pain and blood in stool.  Genitourinary: Negative for dysuria, frequency and hematuria.  Musculoskeletal: Negative for back pain and arthralgias.  Skin: Negative.     Neurological: Negative for dizziness, seizures and speech difficulty.  Psychiatric/Behavioral: Negative for hallucinations and confusion.    Physical Exam  BP 152/73  Pulse 71  Temp(Src) 98.4 F (36.9 C) (Oral)  Resp 20  SpO2 96%  Physical Exam  ED Course: 1238 Pt removed from LSB by me. Pt noted to have a comminuted fracture of the right shoulder. She uses a walker to get around and is blind in the left eye. Will talk with medicine for admission and ortho consult. 1730 - Dr Romeo Apple agrees to consult. Dr Felecia Shelling will admit pt to the hospital. Family made aware of plans.  Procedures  MDM I have reviewed nursing notes, vital signs, and all appropriate lab and imaging results for this patient.  Results for orders placed during the hospital encounter of 11/20/10  CBC      Component Value Range   WBC 23.7 (*) 4.0 - 10.5 (K/uL)   RBC 3.87  3.87 - 5.11 (MIL/uL)   Hemoglobin 12.2  12.0 - 15.0 (g/dL)   HCT 40.9  81.1 - 91.4 (%)   MCV 94.1  78.0 - 100.0 (fL)   MCH 31.5  26.0 - 34.0 (pg)   MCHC 33.5  30.0 - 36.0 (g/dL)   RDW 78.2  95.6 - 21.3 (%)   Platelets 271  150 -  400 (K/uL)  BASIC METABOLIC PANEL      Component Value Range   Sodium 139  135 - 145 (mEq/L)   Potassium 4.4  3.5 - 5.1 (mEq/L)   Chloride 103  96 - 112 (mEq/L)   CO2 29  19 - 32 (mEq/L)   Glucose, Bld 154 (*) 70 - 99 (mg/dL)   BUN 31 (*) 6 - 23 (mg/dL)   Creatinine, Ser 1.91  0.50 - 1.10 (mg/dL)   Calcium 9.2  8.4 - 47.8 (mg/dL)   GFR calc non Af Amer >60  >60 (mL/min)   GFR calc Af Amer >60  >60 (mL/min)  URINALYSIS, ROUTINE W REFLEX MICROSCOPIC      Component Value Range   Color, Urine YELLOW  YELLOW    Appearance CLEAR  CLEAR    Specific Gravity, Urine 1.025  1.005 - 1.030    pH 6.5  5.0 - 8.0    Glucose, UA NEGATIVE  NEGATIVE (mg/dL)   Hgb urine dipstick NEGATIVE  NEGATIVE    Bilirubin Urine NEGATIVE  NEGATIVE    Ketones, ur NEGATIVE  NEGATIVE (mg/dL)   Protein, ur NEGATIVE  NEGATIVE (mg/dL)    Urobilinogen, UA 0.2  0.0 - 1.0 (mg/dL)   Nitrite NEGATIVE  NEGATIVE    Leukocytes, UA NEGATIVE  NEGATIVE    Dg Chest 1 View  11/20/2010  *RADIOLOGY REPORT*  Clinical Data: Humeral fracture, fall  CHEST - 1 VIEW  Comparison: None  Findings: Upper normal heart size. Atherosclerotic calcification aorta. Pulmonary vascularity normal. Subsegmental atelectasis left base. Lungs otherwise clear. No pleural effusion or pneumothorax. Comminuted significantly displaced fracture of the proximal right humeral metadiaphysis. No additional fractures seen.  IMPRESSION: Minimal subsegmental atelectasis left base. Comminuted significantly displaced proximal right humeral metadiaphyseal fracture.  Original Report Authenticated By: Lollie Marrow, M.D.   Dg Shoulder Right  11/20/2010  *RADIOLOGY REPORT*  Clinical Data: Pain post fall, history of stroke affecting right side of body  RIGHT SHOULDER - 2+ VIEW  Comparison: None  Findings: Osseous demineralization. AC joint alignment normal. Comminuted significantly displaced fracture of the proximal right humeral metadiaphysis. Fracture extends into the base of the right humeral head. No dislocation identified. Question additional fragment from the margin of the humeral head on the scapular Y view.  IMPRESSION: Comminuted displaced proximal right humeral fracture as above without dislocation.  Original Report Authenticated By: Lollie Marrow, M.D.   Dg Forearm Right  11/20/2010  *RADIOLOGY REPORT*  Clinical Data: Pain, scrapes and bruising at right forearm post fall  RIGHT FOREARM - 2 VIEW  Comparison: None  Findings: Diffuse osseous demineralization. Wrist and elbow joint alignments normal. No acute fracture, dislocation, or bone destruction.  IMPRESSION: No acute osseous abnormalities.  Original Report Authenticated By: Lollie Marrow, M.D.   Dg Hip Complete Right  11/20/2010  *RADIOLOGY REPORT*  Clinical Data: Right hip pain post fall  RIGHT HIP - COMPLETE 2+ VIEW   Comparison: None  Findings: Diffuse osseous demineralization. Degenerative disc and facet disease changes lower lumbar spine. Levoconvex scoliosis noted. Symmetric SI joints. Mild left hip joint space narrowing. Marked degenerative changes of the right hip joint with joint space obliteration, subchondral sclerosis with minimal cyst formation, extensive spur formation, and superolateral subluxation of the right femoral head. Right femoral head is flattened and deformed. No definite acute fracture or dislocation identified. Prominent stool in rectum. Difficult to exclude abnormalities at the pubic rami due to the degree of demineralization and superimposed stool.  IMPRESSION: Advanced osteoarthritic  changes of the right hip joint as above. Degenerative disc and facet disease changes of lumbar spine with levoconvex scoliosis. No definite acute bony abnormality identified.  Original Report Authenticated By: Lollie Marrow, M.D.   Ct Head Wo Contrast  11/20/2010  *RADIOLOGY REPORT*  Clinical Data:  Fall.  Head and neck pain.  CT HEAD WITHOUT CONTRAST CT CERVICAL SPINE WITHOUT CONTRAST  Technique:  Multidetector CT imaging of the head and cervical spine was performed following the standard protocol without intravenous contrast.  Multiplanar CT image reconstructions of the cervical spine were also generated.  Comparison:  05/07/2008 [facility] hospital.  CT HEAD  Findings: No skull fracture or intracranial hemorrhage.  Remote infarct with encephalomalacia left frontal lobe and right parietal lobe.  Prominent small vessel disease type changes. No CT evidence of large acute infarct.  Small acute infarct cannot be excluded by CT.  Age related atrophy without hydrocephalus.  Small vessel disease type changes.  No intracranial mass lesion detected on this unenhanced exam.  Vascular calcifications.  Tiny radiopaque structure posterior aspect of the left globe unchanged.  Exophthalmos.  IMPRESSION: No skull fracture or  intracranial hemorrhage.  Remote infarcts and small vessel disease type changes as detailed above.  CT CERVICAL SPINE  Findings: No cervical spine fracture.  Anterior slip of C3 as noted previously.  Cervical spondylotic changes with various degrees of spinal stenosis and foraminal narrowing most prominent on the right at the C5-6 level.  No abnormal prevertebral soft tissue swelling.  IMPRESSION: No cervical spine fracture.  Cervical spondylotic changes as noted above.  Original Report Authenticated By: Fuller Canada, M.D.   Ct Cervical Spine Wo Contrast  11/20/2010  *RADIOLOGY REPORT*  Clinical Data:  Fall.  Head and neck pain.  CT HEAD WITHOUT CONTRAST CT CERVICAL SPINE WITHOUT CONTRAST  Technique:  Multidetector CT imaging of the head and cervical spine was performed following the standard protocol without intravenous contrast.  Multiplanar CT image reconstructions of the cervical spine were also generated.  Comparison:  05/07/2008 [facility] hospital.  CT HEAD  Findings: No skull fracture or intracranial hemorrhage.  Remote infarct with encephalomalacia left frontal lobe and right parietal lobe.  Prominent small vessel disease type changes. No CT evidence of large acute infarct.  Small acute infarct cannot be excluded by CT.  Age related atrophy without hydrocephalus.  Small vessel disease type changes.  No intracranial mass lesion detected on this unenhanced exam.  Vascular calcifications.  Tiny radiopaque structure posterior aspect of the left globe unchanged.  Exophthalmos.  IMPRESSION: No skull fracture or intracranial hemorrhage.  Remote infarcts and small vessel disease type changes as detailed above.  CT CERVICAL SPINE  Findings: No cervical spine fracture.  Anterior slip of C3 as noted previously.  Cervical spondylotic changes with various degrees of spinal stenosis and foraminal narrowing most prominent on the right at the C5-6 level.  No abnormal prevertebral soft tissue swelling.  IMPRESSION: No  cervical spine fracture.  Cervical spondylotic changes as noted above.  Original Report Authenticated By: Fuller Canada, M.D.       Kathie Dike, [place] 11/20/10 [phone]

## 2010-11-21 DIAGNOSIS — S42209A Unspecified fracture of upper end of unspecified humerus, initial encounter for closed fracture: Secondary | ICD-10-CM

## 2010-11-21 MED ORDER — GATIFLOXACIN 0.5 % OP SOLN
1.0000 [drp] | Freq: Four times a day (QID) | OPHTHALMIC | Status: DC
  Administered 2010-11-21 – 2010-11-27 (×24): 1 [drp] via OPHTHALMIC
  Filled 2010-11-21 (×2): qty 2.5

## 2010-11-21 MED ORDER — AMLODIPINE BESYLATE 5 MG PO TABS
10.0000 mg | ORAL_TABLET | Freq: Every day | ORAL | Status: DC
  Administered 2010-11-21 – 2010-11-27 (×6): 10 mg via ORAL
  Filled 2010-11-21 (×6): qty 2

## 2010-11-21 NOTE — Plan of Care (Signed)
Problem: Phase I Progression Outcomes Goal: OOB as tolerated unless otherwise ordered Outcome: Completed/Met Date Met:  11/21/10 Pt up to chair and BSC today with assistance

## 2010-11-21 NOTE — Consult Note (Signed)
Reason for Consult: Fracture right humerus Referring Physician: Dr. Dory Horn Carla Bonilla is an 77 y.o. female.  HPI: 77 year-old female history of CVA with residual balance issues walks in the home with a walker and out of the home with a cane and standby assist. The patient lives at home with her sister who has multiple sclerosis. She was in her usual state of health yesterday fell landed on her right arm and sustained a severely comminuted proximal humerus fracture involving the shaft and humeral neck.  She is on Plavix. She was admitted because she could not manage by herself at home. She will need elective surgery on the right upper extremity. She will wait 5 days for the Plavix to wear off. She will require a special plate to be brought in to fix her fracture she is aware of this.    Past Medical History  Diagnosis Date  . CVA (cerebral vascular accident)   . Hypertension   . Blind left eye   . Glaucoma   . Hearing loss     History reviewed. No pertinent past surgical history.  No family history on file.  Social History:  reports that she has never smoked. She does not have any smokeless tobacco history on file. She reports that she does not drink alcohol or use illicit drugs.  Allergies: No Known Allergies  Medications: I have reviewed the patient's current medications.  Results for orders placed during the hospital encounter of 11/20/10 (from the past 48 hour(s))  CBC     Status: Abnormal   Collection Time   11/20/10  1:29 PM      Component Value Range Comment   WBC 23.7 (*) 4.0 - 10.5 (K/uL)    RBC 3.87  3.87 - 5.11 (MIL/uL)    Hemoglobin 12.2  12.0 - 15.0 (g/dL)    HCT 78.2  95.6 - 21.3 (%)    MCV 94.1  78.0 - 100.0 (fL)    MCH 31.5  26.0 - 34.0 (pg)    MCHC 33.5  30.0 - 36.0 (g/dL)    RDW 08.6  57.8 - 46.9 (%)    Platelets 271  150 - 400 (K/uL)   BASIC METABOLIC PANEL     Status: Abnormal   Collection Time   11/20/10  1:29 PM      Component Value Range  Comment   Sodium 139  135 - 145 (mEq/L)    Potassium 4.4  3.5 - 5.1 (mEq/L)    Chloride 103  96 - 112 (mEq/L)    CO2 29  19 - 32 (mEq/L)    Glucose, Bld 154 (*) 70 - 99 (mg/dL)    BUN 31 (*) 6 - 23 (mg/dL)    Creatinine, Ser 6.29  0.50 - 1.10 (mg/dL)    Calcium 9.2  8.4 - 10.5 (mg/dL)    GFR calc non Af Amer >60  >60 (mL/min)    GFR calc Af Amer >60  >60 (mL/min)   URINALYSIS, ROUTINE W REFLEX MICROSCOPIC     Status: Normal   Collection Time   11/20/10  3:05 PM      Component Value Range Comment   Color, Urine YELLOW  YELLOW     Appearance CLEAR  CLEAR     Specific Gravity, Urine 1.025  1.005 - 1.030     pH 6.5  5.0 - 8.0     Glucose, UA NEGATIVE  NEGATIVE (mg/dL)    Hgb urine dipstick NEGATIVE  NEGATIVE  Bilirubin Urine NEGATIVE  NEGATIVE     Ketones, ur NEGATIVE  NEGATIVE (mg/dL)    Protein, ur NEGATIVE  NEGATIVE (mg/dL)    Urobilinogen, UA 0.2  0.0 - 1.0 (mg/dL)    Nitrite NEGATIVE  NEGATIVE     Leukocytes, UA NEGATIVE  NEGATIVE  MICROSCOPIC NOT DONE ON URINES WITH NEGATIVE PROTEIN, BLOOD, LEUKOCYTES, NITRITE, OR GLUCOSE <1000 mg/dL.    Dg Chest 1 View  11/20/2010  *RADIOLOGY REPORT*  Clinical Data: Humeral fracture, fall  CHEST - 1 VIEW  Comparison: None  Findings: Upper normal heart size. Atherosclerotic calcification aorta. Pulmonary vascularity normal. Subsegmental atelectasis left base. Lungs otherwise clear. No pleural effusion or pneumothorax. Comminuted significantly displaced fracture of the proximal right humeral metadiaphysis. No additional fractures seen.  IMPRESSION: Minimal subsegmental atelectasis left base. Comminuted significantly displaced proximal right humeral metadiaphyseal fracture.  Original Report Authenticated By: Lollie Marrow, M.D.   Dg Shoulder Right  11/20/2010  *RADIOLOGY REPORT*  Clinical Data: Pain post fall, history of stroke affecting right side of body  RIGHT SHOULDER - 2+ VIEW  Comparison: None  Findings: Osseous demineralization. AC joint  alignment normal. Comminuted significantly displaced fracture of the proximal right humeral metadiaphysis. Fracture extends into the base of the right humeral head. No dislocation identified. Question additional fragment from the margin of the humeral head on the scapular Y view.  IMPRESSION: Comminuted displaced proximal right humeral fracture as above without dislocation.  Original Report Authenticated By: Lollie Marrow, M.D.   Dg Forearm Right  11/20/2010  *RADIOLOGY REPORT*  Clinical Data: Pain, scrapes and bruising at right forearm post fall  RIGHT FOREARM - 2 VIEW  Comparison: None  Findings: Diffuse osseous demineralization. Wrist and elbow joint alignments normal. No acute fracture, dislocation, or bone destruction.  IMPRESSION: No acute osseous abnormalities.  Original Report Authenticated By: Lollie Marrow, M.D.   Dg Hip Complete Right  11/20/2010  *RADIOLOGY REPORT*  Clinical Data: Right hip pain post fall  RIGHT HIP - COMPLETE 2+ VIEW  Comparison: None  Findings: Diffuse osseous demineralization. Degenerative disc and facet disease changes lower lumbar spine. Levoconvex scoliosis noted. Symmetric SI joints. Mild left hip joint space narrowing. Marked degenerative changes of the right hip joint with joint space obliteration, subchondral sclerosis with minimal cyst formation, extensive spur formation, and superolateral subluxation of the right femoral head. Right femoral head is flattened and deformed. No definite acute fracture or dislocation identified. Prominent stool in rectum. Difficult to exclude abnormalities at the pubic rami due to the degree of demineralization and superimposed stool.  IMPRESSION: Advanced osteoarthritic changes of the right hip joint as above. Degenerative disc and facet disease changes of lumbar spine with levoconvex scoliosis. No definite acute bony abnormality identified.  Original Report Authenticated By: Lollie Marrow, M.D.   Ct Head Wo Contrast  11/20/2010   *RADIOLOGY REPORT*  Clinical Data:  Fall.  Head and neck pain.  CT HEAD WITHOUT CONTRAST CT CERVICAL SPINE WITHOUT CONTRAST  Technique:  Multidetector CT imaging of the head and cervical spine was performed following the standard protocol without intravenous contrast.  Multiplanar CT image reconstructions of the cervical spine were also generated.  Comparison:  05/07/2008 [facility] hospital.  CT HEAD  Findings: No skull fracture or intracranial hemorrhage.  Remote infarct with encephalomalacia left frontal lobe and right parietal lobe.  Prominent small vessel disease type changes. No CT evidence of large acute infarct.  Small acute infarct cannot be excluded by CT.  Age related atrophy  without hydrocephalus.  Small vessel disease type changes.  No intracranial mass lesion detected on this unenhanced exam.  Vascular calcifications.  Tiny radiopaque structure posterior aspect of the left globe unchanged.  Exophthalmos.  IMPRESSION: No skull fracture or intracranial hemorrhage.  Remote infarcts and small vessel disease type changes as detailed above.  CT CERVICAL SPINE  Findings: No cervical spine fracture.  Anterior slip of C3 as noted previously.  Cervical spondylotic changes with various degrees of spinal stenosis and foraminal narrowing most prominent on the right at the C5-6 level.  No abnormal prevertebral soft tissue swelling.  IMPRESSION: No cervical spine fracture.  Cervical spondylotic changes as noted above.  Original Report Authenticated By: Fuller Canada, M.D.   Ct Cervical Spine Wo Contrast  11/20/2010  *RADIOLOGY REPORT*  Clinical Data:  Fall.  Head and neck pain.  CT HEAD WITHOUT CONTRAST CT CERVICAL SPINE WITHOUT CONTRAST  Technique:  Multidetector CT imaging of the head and cervical spine was performed following the standard protocol without intravenous contrast.  Multiplanar CT image reconstructions of the cervical spine were also generated.  Comparison:  05/07/2008 [facility] hospital.  CT  HEAD  Findings: No skull fracture or intracranial hemorrhage.  Remote infarct with encephalomalacia left frontal lobe and right parietal lobe.  Prominent small vessel disease type changes. No CT evidence of large acute infarct.  Small acute infarct cannot be excluded by CT.  Age related atrophy without hydrocephalus.  Small vessel disease type changes.  No intracranial mass lesion detected on this unenhanced exam.  Vascular calcifications.  Tiny radiopaque structure posterior aspect of the left globe unchanged.  Exophthalmos.  IMPRESSION: No skull fracture or intracranial hemorrhage.  Remote infarcts and small vessel disease type changes as detailed above.  CT CERVICAL SPINE  Findings: No cervical spine fracture.  Anterior slip of C3 as noted previously.  Cervical spondylotic changes with various degrees of spinal stenosis and foraminal narrowing most prominent on the right at the C5-6 level.  No abnormal prevertebral soft tissue swelling.  IMPRESSION: No cervical spine fracture.  Cervical spondylotic changes as noted above.  Original Report Authenticated By: Fuller Canada, M.D.    Review of Systems  Constitutional: Negative.   HENT: Negative.   Eyes: Negative for redness.  Respiratory: Negative.   Cardiovascular: Negative.   Gastrointestinal: Negative for heartburn.  Genitourinary: Negative for dysuria.  Musculoskeletal: Positive for joint pain.  Skin: Negative.   Neurological: Negative for loss of consciousness.  Endo/Heme/Allergies: Negative for environmental allergies.  Psychiatric/Behavioral: Negative.    Blood pressure 111/65, pulse 70, temperature 98.5 F (36.9 C), temperature source Oral, resp. rate 16, height 5\' 3"  (1.6 m), weight 66.8 kg (147 lb 4.3 oz), SpO2 93.00%. Physical Exam  Constitutional: She is oriented to person, place, and time. She appears well-developed and well-nourished.  HENT:  Head: Atraumatic.  Eyes: Conjunctivae are normal.  Neck: No tracheal deviation present.   Cardiovascular: Normal rate.   Respiratory: Effort normal.  GI: Soft.  Musculoskeletal:       Right shoulder: She exhibits decreased range of motion, tenderness, bony tenderness, swelling, crepitus, pain and decreased strength. She exhibits no deformity and normal pulse.       Left shoulder: Normal.       Right hip: Normal.       Left hip: Normal.  Lymphadenopathy:    She has no cervical adenopathy.  Neurological: She is alert and oriented to person, place, and time. She exhibits normal muscle tone.  Skin: Skin  is warm and dry.  Psychiatric: She has a normal mood and affect.    Assessment/Plan: Severely comminuted unusual proximal humerus fracture. This will require internal fixation. This is a highly unusual fracture and was highly difficult fracture to treat. It is associated with a high degree of nonunion.  I've discussed this with the patient she understands the treatment plan and options. Without surgery she understands that her arm is unlikely to heal and a fashion that would allow her to use it.  Surgery is scheduled for Wednesday.    Fuller Canada 11/21/2010, 2:55 PM

## 2010-11-21 NOTE — H&P (Signed)
NAME:  Carla Bonilla, KONIECZNY             ACCOUNT NO.:  [id number]  MEDICAL RECORD NO.:  [id number]  LOCATION:  A316                          FACILITY:  [facility]  PHYSICIAN:  Tesfaye D. Felecia Shelling, MD   DATE OF BIRTH:  09-21-1920  DATE OF ADMISSION:  11/20/2010 DATE OF DISCHARGE:  LH                             HISTORY & PHYSICAL   CHIEF COMPLAINT:  Accidental fall.  HISTORY OF PRESENT ILLNESS:  This is an 77 year old female patient with history of multiple medical illnesses who accidentally fell at home and she was brought to emergency room where she was found to have fracture of the right humerus.  The patient used to live alone at home.  She sill ambulates with walker.  The patient is currently unable to ambulate or do her daily living activity.  Surgical consult was done in the emergency room and was advised to immobilize the right upper extremity. The patient was admitted for further treatment and possible placement where she can get assistance for her daily living activity.  REVIEW OF SYSTEMS:  The patient complains of pain in her upper extremity.  No fever, chills, cough, no shortness of breath, nausea, vomiting, abdominal pain, dysuria, urgency or frequency of urination.  PAST MEDICAL HISTORY: 1. Glaucoma. 2. Hypertension. 3. History of eye surgery.  SOCIAL HISTORY:  The patient lives alone at home.  She ambulates with a walker.  No history of alcohol, tobacco or substance abuse.  FAMILY HISTORY:  Not available at this time.  MEDICATIONS: 1. Norvasc 10 mg p.o. daily. 2. Gatifloxacin 0.5 ophthalmic eyedrops 1 drop q. 4 hours. 3. Metoprolol 25 mg b.i.d. 4. Timoptic 0.25% eyedrops 1 drop twice a day.  PHYSICAL EXAMINATION:  GENERAL:  The patient is alert, awake, and chronically sick looking. VITAL SIGNS:  Blood pressure 152/73, pulse 71, respiratory rate 20, temperature 98.5 degrees Fahrenheit. HEENT:  Pupils are equal and reactive. NECK:  Supple. CHEST:  Clear lung fields, good  air entry. CARDIOVASCULAR SYSTEM:  First and second heart sound heard.  No murmur, no gallop. ABDOMEN:  Soft and lax.  Bowel sound is positive.  No mass or organomegaly. EXTREMITIES:  Her right upper extremity is splinted, it is tender in the mid humerus area and there is swelling.  LABORATORY DATA:  On admission, CBC WBC 23.7, hemoglobin 12.1, hematocrit 36.4, and platelets 271.  Sodium 139, potassium 4.4, chloride 103, carbon dioxide 29, glucose of 54, BUN 31, creatinine 0.5. Urinalysis specific gravity 1.025, pH 6.5, glucose negative, nitrite negative, protein negative.  ASSESSMENT: 1. Comminuted and displaced right humerus fracture. 2. History of hypertension. 3. Difficult to ambulate. 4. Leukocytosis, etiology unclear. 5. History of glaucoma.  PLAN:  We will admit the patient and do Orthopedic consult.  Continue pain management.  Continue supportive care.  The patient probably may need placement.     Tesfaye D. Felecia Shelling, MD     TDF/MEDQ  D:  11/21/2010  T:  11/21/2010  Job:  [id number]

## 2010-11-21 NOTE — Progress Notes (Signed)
NAME:  Carla Bonilla, Carla Bonilla             ACCOUNT NO.:  [id number]  MEDICAL RECORD NO.:  [id number]  LOCATION:  A316                          FACILITY:  [facility]  PHYSICIAN:  Tesfaye D. Felecia Shelling, MD   DATE OF BIRTH:  November 07, 1920  DATE OF PROCEDURE: DATE OF DISCHARGE:                                PROGRESS NOTE   SUBJECTIVE:  The patient feels better.  Continue to have some pain in her right shoulder area.  OBJECTIVE:  GENERAL:  The patient is alert, awake, and resting, not in any acute distress. VITAL SIGNS:  Blood pressure 109/56, pulse 58, respiratory rate 16, temperature 97 degrees Fahrenheit. CHEST:  Clear.  Lung fields good air entry. CARDIOVASCULAR SYSTEM:  First and second heart sound heard.  No murmur. No gallop. ABDOMEN:  Soft and lax.  Bowel sounds positive.  No mass or organomegaly. EXTREMITIES:  No leg edema.  ASSESSMENT: 1. Right humerus fracture. 2. Difficult to ambulate. 3. Leukocytosis  PLAN:  Continue the patient on pain management.  We will repeat CBC, BMP.  Continue current treatment and supportive care.     Tesfaye D. Felecia Shelling, MD     TDF/MEDQ  D:  11/21/2010  T:  11/21/2010  Job:  [id number]

## 2010-11-21 NOTE — Plan of Care (Signed)
Problem: Consults Goal: General Medical Patient Education See Patient Education Module for specific education. Pt aware waiting for consult from orthopedic surgeon to make decision.  Problem: Phase I Progression Outcomes Goal: Voiding-avoid urinary catheter unless indicated Pt does not have foley at this time.

## 2010-11-22 LAB — FOLATE: Folate: 13.4 ng/mL

## 2010-11-22 LAB — VITAMIN B12: Vitamin B-12: 350 pg/mL (ref 211–911)

## 2010-11-22 LAB — CBC
HCT: 24.4 % — ABNORMAL LOW (ref 36.0–46.0)
Hemoglobin: 8.1 g/dL — ABNORMAL LOW (ref 12.0–15.0)
MCH: 30.9 pg (ref 26.0–34.0)
MCHC: 33.2 g/dL (ref 30.0–36.0)
MCV: 93.1 fL (ref 78.0–100.0)
Platelets: 211 10*3/uL (ref 150–400)
RBC: 2.62 MIL/uL — ABNORMAL LOW (ref 3.87–5.11)
RDW: 14.1 % (ref 11.5–15.5)
WBC: 13.8 10*3/uL — ABNORMAL HIGH (ref 4.0–10.5)

## 2010-11-22 LAB — BASIC METABOLIC PANEL
CO2: 26 mEq/L (ref 19–32)
Chloride: 103 mEq/L (ref 96–112)
GFR calc Af Amer: >60 mL/min (ref >60)
Potassium: 4 mEq/L (ref 3.5–5.1)
Sodium: 135 mEq/L (ref 135–145)

## 2010-11-22 LAB — IRON AND TIBC
Iron: 11 ug/dL — ABNORMAL LOW (ref 42–135)
Saturation Ratios: 5 % — ABNORMAL LOW (ref 20–55)
TIBC: 210 ug/dL — ABNORMAL LOW (ref 250–470)
UIBC: 199 ug/dL (ref 125–400)

## 2010-11-22 LAB — RETICULOCYTES
RBC.: 2.56 MIL/uL — ABNORMAL LOW (ref 3.87–5.11)
Retic Ct Pct: 2.6 % (ref 0.4–3.1)

## 2010-11-22 LAB — FERRITIN: Ferritin: 215 ng/mL (ref 10–291)

## 2010-11-22 NOTE — Progress Notes (Signed)
NAME:  Carla Bonilla, SWIGERT             ACCOUNT NO.:  [id number]  MEDICAL RECORD NO.:  [id number]  LOCATION:  A316                          FACILITY:  [facility]  PHYSICIAN:  Darlin Stenseth D. Felecia Shelling, MD   DATE OF BIRTH:  January 10, 1921  DATE OF PROCEDURE:  11/22/2010 DATE OF DISCHARGE:                                PROGRESS NOTE   SUBJECTIVE:  The patient feels better.  Her pain is controlled.  She has no new complaints.  OBJECTIVE:  GENERAL:  The patient is alert, awake and resting. VITAL SIGNS:  Blood pressure 112/69, pulse 79, respiratory rate 18, temperature 97 degrees Fahrenheit. CHEST:  Clear lung fields.  Good air entry. CARDIOVASCULAR SYSTEM:  First and second heart sound heard.  No murmur. No gallop. ABDOMEN:  Soft and lax.  Bowel sound is positive.  No mass or organomegaly. EXTREMITIES:  No leg edema.  LABORATORY DATA:  WBC 12.8, hemoglobin 8.1, hematocrit 24.4, and platelet 211.  ASSESSMENT: 1. Fracture of the right humerus. 2. Anemia. 3. Deconditioning. 4. Difficult to ambulate. 5. Leukocytosis.  PLAN:  We will continue to monitor her CBC.  We will do anemia profile. We will do stool guaiac and we will continue current recommendation according to Orthopedics.     Glendale Youngblood D. Felecia Shelling, MD     TDF/MEDQ  D:  11/22/2010  T:  11/22/2010  Job:  [id number]

## 2010-11-23 ENCOUNTER — Inpatient Hospital Stay (HOSPITAL_COMMUNITY): Payer: Medicare Other

## 2010-11-23 LAB — CBC
Hemoglobin: 7.9 g/dL — ABNORMAL LOW (ref 12.0–15.0)
MCH: 31.1 pg (ref 26.0–34.0)
MCHC: 33.2 g/dL (ref 30.0–36.0)
MCV: 93.7 fL (ref 78.0–100.0)
Platelets: 203 10*3/uL (ref 150–400)

## 2010-11-23 LAB — PREPARE RBC (CROSSMATCH)

## 2010-11-23 MED ORDER — CEFAZOLIN SODIUM 1-5 GM-% IV SOLN
1.0000 g | INTRAVENOUS | Status: AC
  Administered 2010-11-25 (×2): 1 g via INTRAVENOUS
  Filled 2010-11-23: qty 50

## 2010-11-23 MED ORDER — FUROSEMIDE 10 MG/ML IJ SOLN
20.0000 mg | Freq: Once | INTRAMUSCULAR | Status: AC
  Administered 2010-11-23: 20 mg via INTRAVENOUS
  Filled 2010-11-23: qty 2

## 2010-11-23 MED ORDER — FUROSEMIDE 10 MG/ML IJ SOLN: 20.0000 mg | Freq: Once | INTRAMUSCULAR | Status: AC

## 2010-11-23 MED ORDER — CEFAZOLIN SODIUM 1-5 GM-% IV SOLN
INTRAVENOUS | Status: AC
  Filled 2010-11-23: qty 50

## 2010-11-23 NOTE — Progress Notes (Signed)
NAME:  Carla Bonilla, Carla Bonilla             ACCOUNT NO.:  [id number]  MEDICAL RECORD NO.:  [id number]  LOCATION:  A316                          FACILITY:  [facility]  PHYSICIAN:  Jannah Guardiola D. Felecia Shelling, MD   DATE OF BIRTH:  08/31/1920  DATE OF PROCEDURE:  11/23/2010 DATE OF DISCHARGE:                                PROGRESS NOTE   SUBJECTIVE:  The patient continued to complain of pain in her right upper extremity.  She is planned for surgery by Dr. Romeo Apple.  OBJECTIVE:  GENERAL:  The patient is alert, awake, and resting, not in acute distress. VITAL SIGNS:  Blood pressure 108/62, pulse 69, respiratory rate 16, temperature 98.5 degrees Fahrenheit. CHEST:  Clear lung fields.  Good air entry. CARDIOVASCULAR SYSTEM:  First and second heart sound heard.  No murmur. No gallop. ABDOMEN:  Soft and lax.  Bowel sounds positive.  No mass or organomegaly. EXTREMITIES:  No leg edema.  LABORATORY DATA:  WBC 12.6, hemoglobin 7.9, hematocrit 23.8, and platelet 203.  Vitamin B12 level 350, folate 13.4, iron 11, TIBC 210, saturation 5, ferritin level 215.  Occult stool result is pending.  ASSESSMENT: 1. Fracture of the right humerus. 2. Anemia probably secondary to blood loss. 3. Deconditioning. 4. Difficult to ambulate.  PLAN:  We will type and crossmatch and transfuse 2 units of packed red blood cells.  Continue current plan as per Dr. Romeo Apple.     Vyctoria Dickman D. Felecia Shelling, MD     TDF/MEDQ  D:  11/23/2010  T:  11/23/2010  Job:  [id number]

## 2010-11-23 NOTE — Progress Notes (Signed)
Subjective:   Procedure(s) (LRB): OPEN REDUCTION INTERNAL FIXATION (ORIF) PROXIMAL HUMERUS FRACTURE (Right) Patient reports pain as mild.    Objective: Vital signs in last 24 hours: Temp:  [98.5 F (36.9 C)-99.6 F (37.6 C)] 98.5 F (36.9 C) (09/03 0541) Pulse Rate:  [69-95] 69  (09/03 0541) Resp:  [16-20] 20  (09/03 0541) BP: (108-125)/(62-66) 111/66 mmHg (09/03 0541) SpO2:  [87 %-96 %] 87 % (09/03 0541)  Intake/Output from previous day: 09/02 0701 - 09/03 0700 In: 1080 [P.O.:1080] Out: 600 [Urine:600] Intake/Output this shift: I/O this shift: In: 240 [P.O.:240] Out: -    Basename 11/23/10 0546 11/22/10 0426 11/20/10 1329  HGB 7.9* 8.1* 12.2    Basename 11/23/10 0546 11/22/10 0426  WBC 12.6* 13.8*  RBC 2.54* 2.62*  HCT 23.8* 24.4*  PLT 203 211    Basename 11/22/10 0426 11/20/10 1329  NA 135 139  K 4.0 4.4  CL 103 103  CO2 26 29  BUN 34* 31*  CREATININE 0.91 0.85  GLUCOSE 105* 154*  CALCIUM 8.3* 9.2   No results found for this basename: LABPT:2,INR:2 in the last 72 hours  Neurologically intact skin tear right arm   Assessment/Plan:   Procedure(s) (LRB): OPEN REDUCTION INTERNAL FIXATION (ORIF) PROXIMAL HUMERUS FRACTURE (Right) PLAN: OOB TO CHAIR  PREOP FOR Wednesday   Fuller Canada 11/23/2010, 12:23 PM

## 2010-11-23 NOTE — Progress Notes (Signed)
Nutrition Note  Pt scheduled for ORIF due to proximal humerus  on Wednesday. Reported difficulty with swallowing pills at during admission hx assessment. She is edentulous and has upper and lower dentures but denies difficulty with current Regular consistency diet. Will monitor po's and diet tol peripherally.

## 2010-11-24 DIAGNOSIS — I10 Essential (primary) hypertension: Secondary | ICD-10-CM | POA: Diagnosis present

## 2010-11-24 DIAGNOSIS — I6930 Unspecified sequelae of cerebral infarction: Secondary | ICD-10-CM

## 2010-11-24 DIAGNOSIS — H409 Unspecified glaucoma: Secondary | ICD-10-CM | POA: Diagnosis present

## 2010-11-24 DIAGNOSIS — S42401A Unspecified fracture of lower end of right humerus, initial encounter for closed fracture: Secondary | ICD-10-CM | POA: Diagnosis present

## 2010-11-24 LAB — PROTIME-INR
INR: 1.12 (ref 0.00–1.49)
Prothrombin Time: 14.6 seconds (ref 11.6–15.2)

## 2010-11-24 LAB — BASIC METABOLIC PANEL
CO2: 26 mEq/L (ref 19–32)
Calcium: 8.5 mg/dL (ref 8.4–10.5)
Chloride: 102 mEq/L (ref 96–112)
Creatinine, Ser: 0.91 mg/dL (ref 0.50–1.10)
Glucose, Bld: 102 mg/dL — ABNORMAL HIGH (ref 70–99)

## 2010-11-24 LAB — CBC
Hemoglobin: 11.8 g/dL — ABNORMAL LOW (ref 12.0–15.0)
MCH: 31.1 pg (ref 26.0–34.0)
MCV: 91.3 fL (ref 78.0–100.0)
Platelets: 215 10*3/uL (ref 150–400)
RBC: 3.79 MIL/uL — ABNORMAL LOW (ref 3.87–5.11)
WBC: 13.7 10*3/uL — ABNORMAL HIGH (ref 4.0–10.5)

## 2010-11-24 NOTE — Progress Notes (Signed)
Subjective: She says she's feeling a little bit better. She has a fractured humerus and is set for surgery. There initially was some discussion about her being transferred to [facility] which he tells me today that she wants to have her surgery done here by Dr. Romeo Apple her speech is difficult to understand but she does make herself understood.  Objective: Vital signs in last 24 hours: Temp:  [98.1 F (36.7 C)-100.3 F (37.9 C)] 98.1 F (36.7 C) (09/04 0600) Pulse Rate:  [85-96] 85  (09/04 0600) Resp:  [18-20] 20  (09/04 0600) BP: (101-130)/(52-71) 126/71 mmHg (09/04 0600) SpO2:  [94 %-96 %] 96 % (09/04 0600) Weight change:  Last BM Date: 11/21/10  Intake/Output from previous day: 09/03 0701 - 09/04 0700 In: 2302 [P.O.:600; I.V.:1000; Blood:700; IV Piggyback:2] Out: 600 [Urine:600]  PHYSICAL EXAM General appearance: alert, cooperative and Dysarthric Resp: clear to auscultation bilaterally Cardio: regular rate and rhythm, S1, S2 normal, no murmur, click, rub or gallop GI: soft, non-tender; bowel sounds normal; no masses,  no organomegaly Extremities: She has bruising of the humerus area on the right arm.  Lab Results:    Basic Metabolic Panel:  Basename 11/24/10 0535 11/22/10 0426  NA 138 135  K 4.0 4.0  CL 102 103  CO2 26 26  GLUCOSE 102* 105*  BUN 28* 34*  CREATININE 0.91 0.91  CALCIUM 8.5 8.3*  MG -- --  PHOS -- --   Liver Function Tests: No results found for this basename: AST:2,ALT:2,ALKPHOS:2,BILITOT:2,PROT:2,ALBUMIN:2 in the last 72 hours No results found for this basename: LIPASE:2,AMYLASE:2 in the last 72 hours No results found for this basename: AMMONIA:2 in the last 72 hours CBC:  Basename 11/24/10 0535 11/23/10 0546  WBC 13.7* 12.6*  NEUTROABS -- --  HGB 11.8* 7.9*  HCT 34.6* 23.8*  MCV 91.3 93.7  PLT 215 203   Cardiac Enzymes: No results found for this basename: CKTOTAL:3,CKMB:3,CKMBINDEX:3,TROPONINI:3 in the last 72 hours BNP: No results  found for this basename: POCBNP:3 in the last 72 hours D-Dimer: No results found for this basename: DDIMER:2 in the last 72 hours CBG: No results found for this basename: GLUCAP:6 in the last 72 hours Hemoglobin A1C: No results found for this basename: HGBA1C in the last 72 hours Fasting Lipid Panel: No results found for this basename: CHOL,HDL,LDLCALC,TRIG,CHOLHDL,LDLDIRECT in the last 72 hours Thyroid Function Tests: No results found for this basename: TSH,T4TOTAL,FREET4,T3FREE,THYROIDAB in the last 72 hours Anemia Panel:  Basename 11/22/10 1032  VITAMINB12 350  FOLATE 13.4  FERRITIN 215  TIBC 210*  IRON 11*  RETICCTPCT 2.6   Urine Drug Screen:  Alcohol Level: No results found for this basename: ETH:2 in the last 72 hours Urinalysis:  Misc. Labs:  ABGS No results found for this basename: PHART,PCO2,PO2ART,TCO2,HCO3 in the last 72 hours CULTURES No results found for this or any previous visit (from the past 240 hour(s)). Studies/Results: Chest Portable 1 View  11/23/2010  *RADIOLOGY REPORT*  Clinical Data: Preop.  Altered mental status.  Stroke patient. Preop for shoulder surgery.  PORTABLE CHEST - 1 VIEW  Comparison: 11/20/2010  Findings: Heart size is mildly enlarged.  There are perihilar peribronchial changes.  There are no focal consolidations or pleural effusions.  No pulmonary edema.  No evidence for pneumothorax or displaced rib fracture.  Again noted is comminuted proximal right humerus fracture with significant displacement.  IMPRESSION:  1.  Cardiomegaly without pulmonary edema. 2.  Bronchitic changes. 3.  Proximal right humerus fracture.  Original Report Authenticated By: Lanora Manis  D. Manson Passey, M.D.    Medications:  Scheduled:   . amLODipine  10 mg Oral Daily  . ceFAZolin (ANCEF) IV  1 g Intravenous 60 min Pre-Op  . furosemide  20 mg Intravenous Once  . furosemide  20 mg Intravenous Once  . gatifloxacin  1 drop Left Eye QID  . metoprolol tartrate  25 mg Oral BID    . timolol  1 drop Right Eye BID   Continuous:  ZOX:WRUEAVWU injection  Assesment: She has a fractured humerus. She has had a previous stroke. She has hypertension. Active Problems:  * No active hospital problems. *     Plan: I discussed her situation with Dr. Romeo Apple and he plans surgery later this week.    LOS: 4 days   Dante Roudebush L 11/24/2010, 8:26 AM

## 2010-11-24 NOTE — Progress Notes (Signed)
  The patient is preop for a open reduction internal fixation of the right upper extremity proximal humerus

## 2010-11-24 NOTE — Progress Notes (Signed)
Subjective: Preop   Objective: Vital signs in last 24 hours: Temp:  [98.1 F (36.7 C)-100.3 F (37.9 C)] 98.1 F (36.7 C) (09/04 0600) Pulse Rate:  [85-96] 85  (09/04 0600) Resp:  [18-20] 20  (09/04 0600) BP: (101-130)/(52-71) 126/71 mmHg (09/04 0600) SpO2:  [94 %-96 %] 96 % (09/04 0600)  Intake/Output from previous day: 09/03 0701 - 09/04 0700 In: 2302 [P.O.:600; I.V.:1000; Blood:700; IV Piggyback:2] Out: 600 [Urine:600] Intake/Output this shift:     Basename 11/24/10 0535 11/23/10 0546 11/22/10 0426  HGB 11.8* 7.9* 8.1*    Basename 11/24/10 0535 11/23/10 0546  WBC 13.7* 12.6*  RBC 3.79* 2.54*  HCT 34.6* 23.8*  PLT 215 203    Basename 11/24/10 0535 11/22/10 0426  NA 138 135  K 4.0 4.0  CL 102 103  CO2 26 26  BUN 28* 34*  CREATININE 0.91 0.91  GLUCOSE 102* 105*  CALCIUM 8.5 8.3*    Basename 11/24/10 0535  LABPT --  INR 1.12      Assessment/Plan: Preop for open reduction internal fixation right proximal humerus for Wednesday, September 5, laboratory studies look good.   Fuller Canada 11/24/2010, 7:28 AM

## 2010-11-25 ENCOUNTER — Encounter (HOSPITAL_COMMUNITY): Payer: Self-pay | Admitting: Anesthesiology

## 2010-11-25 ENCOUNTER — Other Ambulatory Visit: Payer: Self-pay

## 2010-11-25 ENCOUNTER — Inpatient Hospital Stay (HOSPITAL_COMMUNITY): Payer: Medicare Other

## 2010-11-25 ENCOUNTER — Encounter (HOSPITAL_COMMUNITY)
Admission: EM | Disposition: A | Discharge status: Skilled Nursing Facility | Payer: Self-pay | Source: Home / Self Care | Attending: Pulmonary Disease

## 2010-11-25 ENCOUNTER — Inpatient Hospital Stay (HOSPITAL_COMMUNITY): Payer: Medicare Other | Admitting: Anesthesiology

## 2010-11-25 DIAGNOSIS — S42209A Unspecified fracture of upper end of unspecified humerus, initial encounter for closed fracture: Secondary | ICD-10-CM

## 2010-11-25 HISTORY — PX: ORIF HUMERUS FRACTURE: SHX2126

## 2010-11-25 LAB — PROTIME-INR
INR: 1.15 (ref 0.00–1.49)
Prothrombin Time: 14.9 seconds (ref 11.6–15.2)

## 2010-11-25 LAB — CBC
Hemoglobin: 14.8 g/dL (ref 12.0–15.0)
MCV: 87.5 fL (ref 78.0–100.0)
Platelets: 226 10*3/uL (ref 150–400)
RBC: 4.89 MIL/uL (ref 3.87–5.11)
WBC: 13.2 10*3/uL — ABNORMAL HIGH (ref 4.0–10.5)

## 2010-11-25 LAB — BASIC METABOLIC PANEL
CO2: 24 mEq/L (ref 19–32)
Chloride: 102 mEq/L (ref 96–112)
Glucose, Bld: 111 mg/dL — ABNORMAL HIGH (ref 70–99)
Sodium: 133 mEq/L — ABNORMAL LOW (ref 135–145)

## 2010-11-25 LAB — PREPARE RBC (CROSSMATCH)

## 2010-11-25 SURGERY — OPEN REDUCTION INTERNAL FIXATION (ORIF) PROXIMAL HUMERUS FRACTURE
Anesthesia: General | Site: Arm Upper | Laterality: Right | Wound class: Clean

## 2010-11-25 MED ORDER — ONDANSETRON HCL 4 MG PO TABS: 4.0000 mg | ORAL_TABLET | Freq: Four times a day (QID) | ORAL | Status: DC | PRN

## 2010-11-25 MED ORDER — LACTATED RINGERS IV SOLN
INTRAVENOUS | Status: DC
  Administered 2010-11-25: 16:00:00 via INTRAVENOUS
  Administered 2010-11-26: 75 mL/h via INTRAVENOUS
  Administered 2010-11-26: 1000 via INTRAVENOUS

## 2010-11-25 MED ORDER — LACTATED RINGERS IV SOLN
INTRAVENOUS | Status: DC
  Administered 2010-11-25: 1000 mL via INTRAVENOUS
  Administered 2010-11-25: 13:00:00 via INTRAVENOUS

## 2010-11-25 MED ORDER — SODIUM CHLORIDE 0.9 % IR SOLN
Status: DC | PRN
  Administered 2010-11-25: 1000 mL

## 2010-11-25 MED ORDER — PHENOL 1.4 % MT LIQD: 1.0000 | OROMUCOSAL | Status: DC | PRN

## 2010-11-25 MED ORDER — MIDAZOLAM HCL 2 MG/2ML IJ SOLN
INTRAMUSCULAR | Status: AC
  Administered 2010-11-25: 2 mg via INTRAVENOUS
  Filled 2010-11-25: qty 2

## 2010-11-25 MED ORDER — FENTANYL CITRATE 0.05 MG/ML IJ SOLN
INTRAMUSCULAR | Status: AC
  Filled 2010-11-25: qty 2

## 2010-11-25 MED ORDER — ROCURONIUM BROMIDE 100 MG/10ML IV SOLN
INTRAVENOUS | Status: DC | PRN
  Administered 2010-11-25 (×8): 5 mg via INTRAVENOUS
  Administered 2010-11-25: 35 mg via INTRAVENOUS

## 2010-11-25 MED ORDER — GLYCOPYRROLATE 0.2 MG/ML IJ SOLN
INTRAMUSCULAR | Status: DC | PRN
  Administered 2010-11-25: .2 mg via INTRAVENOUS

## 2010-11-25 MED ORDER — ETOMIDATE 2 MG/ML IV SOLN
INTRAVENOUS | Status: DC | PRN
  Administered 2010-11-25: 14 mg via INTRAVENOUS
  Administered 2010-11-25: 6 mg via INTRAVENOUS

## 2010-11-25 MED ORDER — ROCURONIUM BROMIDE 50 MG/5ML IV SOLN
INTRAVENOUS | Status: AC
  Filled 2010-11-25: qty 1

## 2010-11-25 MED ORDER — BUPIVACAINE-EPINEPHRINE PF 0.5-1:200000 % IJ SOLN
INTRAMUSCULAR | Status: AC
  Filled 2010-11-25: qty 20

## 2010-11-25 MED ORDER — POLYETHYLENE GLYCOL 3350 17 G PO PACK: 17.0000 g | PACK | Freq: Every day | ORAL | Status: DC | PRN

## 2010-11-25 MED ORDER — ENOXAPARIN SODIUM 30 MG/0.3ML ~~LOC~~ SOLN
30.0000 mg | Freq: Two times a day (BID) | SUBCUTANEOUS | Status: DC
  Administered 2010-11-26 – 2010-11-27 (×3): 30 mg via SUBCUTANEOUS
  Filled 2010-11-25 (×3): qty 0.3

## 2010-11-25 MED ORDER — FENTANYL CITRATE 0.05 MG/ML IJ SOLN
INTRAMUSCULAR | Status: DC | PRN
  Administered 2010-11-25 (×2): 50 ug via INTRAVENOUS
  Administered 2010-11-25 (×4): 25 ug via INTRAVENOUS
  Administered 2010-11-25: 50 ug via INTRAVENOUS
  Administered 2010-11-25: 25 ug via INTRAVENOUS

## 2010-11-25 MED ORDER — METOCLOPRAMIDE HCL 5 MG/ML IJ SOLN: 5.0000 mg | Freq: Three times a day (TID) | INTRAMUSCULAR | Status: DC | PRN

## 2010-11-25 MED ORDER — MENTHOL 3 MG MT LOZG: 1.0000 | LOZENGE | OROMUCOSAL | Status: DC | PRN

## 2010-11-25 MED ORDER — HYDROMORPHONE HCL 1 MG/ML IJ SOLN
0.5000 mg | INTRAMUSCULAR | Status: DC | PRN
  Administered 2010-11-25 (×2): 1 mg via INTRAVENOUS
  Filled 2010-11-25 (×2): qty 1

## 2010-11-25 MED ORDER — ETOMIDATE 2 MG/ML IV SOLN
INTRAVENOUS | Status: AC
  Filled 2010-11-25: qty 10

## 2010-11-25 MED ORDER — FLEET ENEMA 7-19 GM/118ML RE ENEM: 1.0000 | ENEMA | Freq: Every day | RECTAL | Status: DC | PRN

## 2010-11-25 MED ORDER — CEFAZOLIN SODIUM 1 G IJ SOLR
INTRAMUSCULAR | Status: AC
  Filled 2010-11-25: qty 10

## 2010-11-25 MED ORDER — DOCUSATE SODIUM 100 MG PO CAPS
100.0000 mg | ORAL_CAPSULE | Freq: Two times a day (BID) | ORAL | Status: DC
  Administered 2010-11-25 – 2010-11-27 (×5): 100 mg via ORAL
  Filled 2010-11-25 (×5): qty 1

## 2010-11-25 MED ORDER — BISACODYL 5 MG PO TBEC: 10.0000 mg | DELAYED_RELEASE_TABLET | Freq: Every day | ORAL | Status: DC | PRN

## 2010-11-25 MED ORDER — MIDAZOLAM HCL 2 MG/2ML IJ SOLN
1.0000 mg | INTRAMUSCULAR | Status: DC | PRN
  Administered 2010-11-25: 2 mg via INTRAVENOUS

## 2010-11-25 MED ORDER — METOCLOPRAMIDE HCL 10 MG PO TABS: 5.0000 mg | ORAL_TABLET | Freq: Three times a day (TID) | ORAL | Status: DC | PRN

## 2010-11-25 MED ORDER — ALUM & MAG HYDROXIDE-SIMETH 200-200-20 MG/5ML PO SUSP: 30.0000 mL | ORAL | Status: DC | PRN

## 2010-11-25 MED ORDER — NEOSTIGMINE METHYLSULFATE 1 MG/ML IJ SOLN
INTRAMUSCULAR | Status: AC
  Filled 2010-11-25: qty 10

## 2010-11-25 MED ORDER — HYDROCODONE-ACETAMINOPHEN 5-325 MG PO TABS
1.0000 | ORAL_TABLET | ORAL | Status: DC | PRN
  Administered 2010-11-26: 2 via ORAL
  Filled 2010-11-25: qty 2

## 2010-11-25 MED ORDER — BISACODYL 10 MG RE SUPP: 10.0000 mg | Freq: Every day | RECTAL | Status: DC | PRN

## 2010-11-25 MED ORDER — ONDANSETRON HCL 4 MG/2ML IJ SOLN: 4.0000 mg | Freq: Four times a day (QID) | INTRAMUSCULAR | Status: DC | PRN

## 2010-11-25 MED ORDER — ALUMINUM HYDROXIDE GEL 600 MG/5ML PO SUSP
15.0000 mL | ORAL | Status: DC | PRN
  Filled 2010-11-25: qty 30

## 2010-11-25 MED ORDER — ACETAMINOPHEN 650 MG RE SUPP: 650.0000 mg | Freq: Four times a day (QID) | RECTAL | Status: DC | PRN

## 2010-11-25 MED ORDER — FENTANYL CITRATE 0.05 MG/ML IJ SOLN
INTRAMUSCULAR | Status: AC
  Administered 2010-11-25: 50 ug via INTRAVENOUS
  Filled 2010-11-25: qty 5

## 2010-11-25 MED ORDER — DIPHENHYDRAMINE HCL 12.5 MG/5ML PO ELIX: 12.5000 mg | ORAL_SOLUTION | ORAL | Status: DC | PRN

## 2010-11-25 MED ORDER — OXYCODONE HCL 5 MG PO TABS: 5.0000 mg | ORAL_TABLET | ORAL | Status: DC | PRN

## 2010-11-25 MED ORDER — GLYCOPYRROLATE 0.2 MG/ML IJ SOLN
INTRAMUSCULAR | Status: AC
  Filled 2010-11-25: qty 1

## 2010-11-25 MED ORDER — ONDANSETRON HCL 4 MG/2ML IJ SOLN: 4.0000 mg | Freq: Once | INTRAMUSCULAR | Status: AC | PRN

## 2010-11-25 MED ORDER — FENTANYL CITRATE 0.05 MG/ML IJ SOLN
25.0000 ug | INTRAMUSCULAR | Status: DC | PRN
  Administered 2010-11-25 (×2): 50 ug via INTRAVENOUS

## 2010-11-25 MED ORDER — FENTANYL CITRATE 0.05 MG/ML IJ SOLN
INTRAMUSCULAR | Status: AC
  Administered 2010-11-25: 50 ug via INTRAVENOUS
  Filled 2010-11-25: qty 2

## 2010-11-25 MED ORDER — ACETAMINOPHEN 325 MG PO TABS: 650.0000 mg | ORAL_TABLET | Freq: Four times a day (QID) | ORAL | Status: DC | PRN

## 2010-11-25 MED ORDER — CEFAZOLIN SODIUM-DEXTROSE 2-3 GM-% IV SOLR
2.0000 g | Freq: Four times a day (QID) | INTRAVENOUS | Status: AC
  Administered 2010-11-25 – 2010-11-26 (×3): 2 g via INTRAVENOUS
  Filled 2010-11-25 (×3): qty 50

## 2010-11-25 MED ORDER — MAGNESIUM HYDROXIDE 400 MG/5ML PO SUSP: 30.0000 mL | Freq: Two times a day (BID) | ORAL | Status: DC | PRN

## 2010-11-25 MED ORDER — BUPIVACAINE-EPINEPHRINE 0.5% -1:200000 IJ SOLN
INTRAMUSCULAR | Status: DC | PRN
  Administered 2010-11-25: 30 mL

## 2010-11-25 MED ORDER — NEOSTIGMINE METHYLSULFATE 1 MG/ML IJ SOLN
INTRAMUSCULAR | Status: DC | PRN
  Administered 2010-11-25: 1 mg via INTRAMUSCULAR

## 2010-11-25 SURGICAL SUPPLY — 105 items
BAG HAMPER (MISCELLANEOUS) ×2 IMPLANT
BANDAGE ELASTIC 4 VELCRO NS (GAUZE/BANDAGES/DRESSINGS) IMPLANT
BANDAGE ELASTIC 6 VELCRO NS (GAUZE/BANDAGES/DRESSINGS) IMPLANT
BANDAGE ESMARK 4X12 BL STRL LF (DISPOSABLE) IMPLANT
BIT DRILL 2.5X110 QC LCP DISP (BIT) ×2 IMPLANT
BIT DRILL QC 2.7MMX125MM (BIT) ×1 IMPLANT
BIT DRILL QC 2.7X125 (BIT) ×1
BLADE HEX COATED 2.75 (ELECTRODE) ×2 IMPLANT
BLADE SURG SZ10 CARB STEEL (BLADE) ×4 IMPLANT
BLADE SURG SZ11 CARB STEEL (BLADE) IMPLANT
BNDG COHESIVE 4X5 TAN NS LF (GAUZE/BANDAGES/DRESSINGS) ×2 IMPLANT
BNDG ESMARK 4X12 BLUE STRL LF (DISPOSABLE)
CAP PIN PROTECTOR ORTHO WHT (CAP) IMPLANT
CATH KIT ON Q 2.5IN SLV (PAIN MANAGEMENT) IMPLANT
CHIPS CORTICOCANC 10CC CC10 (Bone Implant) ×2 IMPLANT
CHLORAPREP W/TINT 26ML (MISCELLANEOUS) ×2 IMPLANT
CLOTH BEACON ORANGE TIMEOUT ST (SAFETY) ×2 IMPLANT
COOLER CRYO CUFF IC AND MOTOR (MISCELLANEOUS) IMPLANT
COVER LIGHT HANDLE STERIS (MISCELLANEOUS) ×4 IMPLANT
COVER MAYO STAND XLG (DRAPE) ×2 IMPLANT
CUFF CRYO UNI SHDR 32X48 (MISCELLANEOUS) IMPLANT
CUFF TOURNIQUET SINGLE 18IN (TOURNIQUET CUFF) IMPLANT
CUFF TOURNIQUET SINGLE 24IN (TOURNIQUET CUFF) IMPLANT
DECANTER SPIKE VIAL GLASS SM (MISCELLANEOUS) IMPLANT
DRAPE C-ARM FOLDED MOBILE STRL (DRAPES) ×2 IMPLANT
DRAPE ORTHO 2.5IN SPLIT 77X108 (DRAPES) ×2 IMPLANT
DRAPE ORTHO SPLIT 77X108 STRL (DRAPES) ×2
DRAPE PROXIMA HALF (DRAPES) ×4 IMPLANT
DRAPE U-SHAPE 47X51 STRL (DRAPES) ×2 IMPLANT
DRESSING ALLEVYN BORDER HEEL (GAUZE/BANDAGES/DRESSINGS) IMPLANT
DRILL BIT QC 2.7MMX125MM (BIT) ×1
DRSG MEPILEX BORDER 4X12 (GAUZE/BANDAGES/DRESSINGS) ×2 IMPLANT
DRSG TEGADERM 4X10 (GAUZE/BANDAGES/DRESSINGS) ×4 IMPLANT
DRSG TEGADERM 4X4.75 (GAUZE/BANDAGES/DRESSINGS) ×2 IMPLANT
ELECT REM PT RETURN 9FT ADLT (ELECTROSURGICAL) ×2
ELECTRODE REM PT RTRN 9FT ADLT (ELECTROSURGICAL) ×1 IMPLANT
GAUZE SPONGE 4X4 12PLY STRL LF (GAUZE/BANDAGES/DRESSINGS) ×4 IMPLANT
GAUZE XEROFORM 5X9 LF (GAUZE/BANDAGES/DRESSINGS) ×2 IMPLANT
GLOVE BIOGEL PI IND STRL 7.0 (GLOVE) ×2 IMPLANT
GLOVE BIOGEL PI INDICATOR 7.0 (GLOVE) ×2
GLOVE ECLIPSE 6.5 STRL STRAW (GLOVE) ×4 IMPLANT
GLOVE SKINSENSE NS SZ8.0 LF (GLOVE) ×3
GLOVE SKINSENSE STRL SZ8.0 LF (GLOVE) ×3 IMPLANT
GLOVE SS BIOGEL STRL SZ 6.5 (GLOVE) ×1 IMPLANT
GLOVE SS N UNI LF 8.5 STRL (GLOVE) ×2 IMPLANT
GLOVE SUPERSENSE BIOGEL SZ 6.5 (GLOVE) ×1
GOWN BRE IMP SLV AUR XL STRL (GOWN DISPOSABLE) ×4 IMPLANT
GOWN STRL REIN XL XLG (GOWN DISPOSABLE) ×2 IMPLANT
GUIDEWIRE 1.6 DRILL TIP (WIRE) ×4 IMPLANT
IMMOBILIZER SHDR 7X13SM BLK (ORTHOPEDIC SUPPLIES) IMPLANT
IMMOBILIZER SHOULDER LGE (ORTHOPEDIC SUPPLIES) IMPLANT
IMMOBILIZER SHOULDER MED (ORTHOPEDIC SUPPLIES) IMPLANT
INST SET MINOR BONE (KITS) ×2 IMPLANT
K-WIRE 229MX1.6 (WIRE) IMPLANT
K-WIRE SGL PT/SMTH 9X35 (WIRE)
KIT ROOM TURNOVER APOR (KITS) ×2 IMPLANT
KWIRE SGL PT/SMTH 9X35 (WIRE) IMPLANT
KWIRE SGL PT/SMTH 9X45IN (WIRE) IMPLANT
MANIFOLD NEPTUNE II (INSTRUMENTS) ×2 IMPLANT
MARKER SKIN DUAL TIP RULER LAB (MISCELLANEOUS) ×2 IMPLANT
NEEDLE HYPO 21X1.5 SAFETY (NEEDLE) ×2 IMPLANT
NS IRRIG 1000ML POUR BTL (IV SOLUTION) ×4 IMPLANT
PACK BASIC III (CUSTOM PROCEDURE TRAY) ×1
PACK SRG BSC III STRL LF ECLPS (CUSTOM PROCEDURE TRAY) ×1 IMPLANT
PAD CAST 4YDX4 CTTN HI CHSV (CAST SUPPLIES) IMPLANT
PADDING CAST COTTON 4X4 STRL (CAST SUPPLIES)
PENCIL HANDSWITCHING (ELECTRODE) ×2 IMPLANT
PIN CAPS ORTHO GREEN .062 (PIN) IMPLANT
PLATE PROX HUMERUS (Plate) ×2 IMPLANT
PUTTY DBX 1CC (Putty) ×2 IMPLANT
PUTTY DBX 1CC DEPUY (Putty) ×1 IMPLANT
SCREW CANN LOCK 3.7X38 (Screw) ×2 IMPLANT
SCREW CONICAL (Screw) ×2 IMPLANT
SCREW CORTEX 3.5 20MM (Screw) ×1 IMPLANT
SCREW CORTEX 3.5 22MM (Screw) ×1 IMPLANT
SCREW CORTEX 3.5 26MM (Screw) ×1 IMPLANT
SCREW LOCK CORT ST 3.5X20 (Screw) ×1 IMPLANT
SCREW LOCK CORT ST 3.5X22 (Screw) ×1 IMPLANT
SCREW LOCK CORT ST 3.5X26 (Screw) ×1 IMPLANT
SCREW LOCK T15 FT 22X3.5XST (Screw) ×1 IMPLANT
SCREW LOCK T15 FT 24X3.5X2.9X (Screw) ×1 IMPLANT
SCREW LOCK T15 FT 32X3.5X2.9X (Screw) ×1 IMPLANT
SCREW LOCK T15 FT 40X3.5XST (Screw) ×4 IMPLANT
SCREW LOCKING 3.5X22 (Screw) ×1 IMPLANT
SCREW LOCKING 3.5X24 (Screw) ×1 IMPLANT
SCREW LOCKING 3.5X32 (Screw) ×1 IMPLANT
SCREW LOCKING 3.5X40 (Screw) ×4 IMPLANT
SET BASIN LINEN APH (SET/KITS/TRAYS/PACK) ×2 IMPLANT
SLING ARM FOAM STRAP LRG (SOFTGOODS) IMPLANT
SLING ARM FOAM STRAP MED (SOFTGOODS) ×2 IMPLANT
SLING ARM FOAM STRAP XLG (SOFTGOODS) IMPLANT
SPONGE GAUZE 4X4 12PLY (GAUZE/BANDAGES/DRESSINGS) ×4 IMPLANT
SPONGE LAP 18X18 X RAY DECT (DISPOSABLE) ×8 IMPLANT
SPONGE LAP 4X18 X RAY DECT (DISPOSABLE) ×2 IMPLANT
STAPLER VISISTAT 35W (STAPLE) ×2 IMPLANT
STOCKINETTE IMPERVIOUS LG (DRAPES) ×2 IMPLANT
SUT ETHIBOND NAB OS 4 #2 30IN (SUTURE) IMPLANT
SUT MON AB 0 CT1 (SUTURE) ×6 IMPLANT
SUT MON AB 2-0 SH 27 (SUTURE) ×1
SUT MON AB 2-0 SH27 (SUTURE) ×1 IMPLANT
SUT WIRE 18GA (Wire) ×6 IMPLANT
SYR 30ML LL (SYRINGE) ×2 IMPLANT
SYR BULB IRRIGATION 50ML (SYRINGE) ×4 IMPLANT
TOWEL OR 17X26 4PK STRL BLUE (TOWEL DISPOSABLE) ×2 IMPLANT
YANKAUER SUCT BULB TIP 10FT TU (MISCELLANEOUS) ×2 IMPLANT

## 2010-11-25 NOTE — Addendum Note (Signed)
Addendum  created 11/25/10 1316 by Glynn Octave   Modules edited:Anesthesia Events

## 2010-11-25 NOTE — Anesthesia Procedure Notes (Signed)
Procedure Name: Intubation Date/Time: 11/25/2010 9:36 AM Performed by: Minerva Areola Pre-anesthesia Checklist: Patient identified, Patient being monitored, Timeout performed, Emergency Drugs available and Suction available Patient Re-evaluated:Patient Re-evaluated prior to inductionOxygen Delivery Method: Circle System Utilized Preoxygenation: Pre-oxygenation with 100% oxygen Intubation Type: IV induction Ventilation: Mask ventilation without difficulty Laryngoscope Size: Miller and 2 Grade View: Grade I Tube type: Oral Tube size: 7.0 mm Number of attempts: 1 Airway Equipment and Method: stylet Placement Confirmation: ETT inserted through vocal cords under direct vision,  positive ETCO2 and breath sounds checked- equal and bilateral Secured at: 21 cm Tube secured with: Tape Dental Injury: Teeth and Oropharynx as per pre-operative assessment

## 2010-11-25 NOTE — Transfer of Care (Signed)
Immediate Anesthesia Transfer of Care Note  Patient: Carla Bonilla  Procedure(s) Performed:  OPEN REDUCTION INTERNAL FIXATION (ORIF) PROXIMAL HUMERUS FRACTURE  Patient Location: PACU  Anesthesia Type: General  Level of Consciousness: awake and sedated  Airway & Oxygen Therapy: Patient Spontanous Breathing and Patient connected to face mask oxygen  Post-op Assessment: Report given to PACU RN  Post vital signs: stable  Complications: No apparent anesthesia complications

## 2010-11-25 NOTE — Anesthesia Postprocedure Evaluation (Signed)
  Anesthesia Post-op Note  Patient: Carla Bonilla  Procedure(s) Performed:  OPEN REDUCTION INTERNAL FIXATION (ORIF) PROXIMAL HUMERUS FRACTURE  Patient Location: PACU  Anesthesia Type: General  Level of Consciousness: awake and alert   Airway and Oxygen Therapy: Patient Spontanous Breathing  Post-op Pain: none  Post-op Assessment: Post-op Vital signs reviewed, Patient's Cardiovascular Status Stable, Respiratory Function Stable, Patent Airway and No signs of Nausea or vomiting  Post-op Vital Signs: stable  Complications: No apparent anesthesia complications

## 2010-11-25 NOTE — Brief Op Note (Addendum)
11/20/2010 - 11/25/2010  12:50 PM  PATIENT:  Carla Bonilla  77 y.o. female  PRE-OPERATIVE DIAGNOSIS:  RIGHT Proximal Humerus Fracture  POST-OPERATIVE DIAGNOSIS:  RIGHT Proximal Humerus Fracture  PROCEDURE:  Procedure(s): OPEN REDUCTION INTERNAL FIXATION RIGHT (ORIF) PROXIMAL HUMERUS FRACTURE AND BONE GRAFT   SURGEON:  Surgeon(s): Fuller Canada, MD  PHYSICIAN ASSISTANT:   ASSISTANTS: BETTY ASHLEY   ANESTHESIA:   general  ESTIMATED BLOOD LOSS: * No blood loss amount entered *   BLOOD ADMINISTERED:none  DRAINS: none   LOCAL MEDICATIONS USED:  MARCAINE 30 WITH EPI CC  SPECIMEN:  No Specimen  DISPOSITION OF SPECIMEN:  N/A  COUNTS:  YES  TOURNIQUET:  * No tourniquets in log *  DICTATION #: [id number]  PLAN OF CARE: PACU THEN FLOOR   PATIENT DISPOSITION:  PACU - hemodynamically stable.   Delay start of Pharmacological VTE agent (>24hrs) due to surgical blood loss or risk of bleeding:  yes

## 2010-11-25 NOTE — Progress Notes (Signed)
UR Chart Review Completed  

## 2010-11-25 NOTE — Progress Notes (Signed)
Subjective: She is set for surgery today.  Objective: Vital signs in last 24 hours: Temp:  [97.3 F (36.3 C)-98.8 F (37.1 C)] 98.4 F (36.9 C) (09/05 0506) Pulse Rate:  [73-94] 81  (09/05 0506) Resp:  [16-20] 17  (09/05 0506) BP: (108-146)/(49-75) 122/67 mmHg (09/05 0506) SpO2:  [95 %] 95 % (09/05 0506) Weight change:  Last BM Date: 11/21/10  Intake/Output from previous day: 09/04 0701 - 09/05 0700 In: 1440 [P.O.:240; I.V.:500; Blood:700] Out: 450 [Urine:450]  PHYSICAL EXAM General appearance: alert, cooperative and no distress Resp: clear to auscultation bilaterally Cardio: regular rate and rhythm, S1, S2 normal, no murmur, click, rub or gallop GI: soft, non-tender; bowel sounds normal; no masses,  no organomegaly Extremities: She has bruising of the right upper extremity  Lab Results:    Basic Metabolic Panel:  Basename 11/25/10 0543 11/24/10 0535  NA 133* 138  K 3.8 4.0  CL 102 102  CO2 24 26  GLUCOSE 111* 102*  BUN 25* 28*  CREATININE 0.78 0.91  CALCIUM 8.6 8.5  MG -- --  PHOS -- --   Liver Function Tests: No results found for this basename: AST:2,ALT:2,ALKPHOS:2,BILITOT:2,PROT:2,ALBUMIN:2 in the last 72 hours No results found for this basename: LIPASE:2,AMYLASE:2 in the last 72 hours No results found for this basename: AMMONIA:2 in the last 72 hours CBC:  Basename 11/25/10 0543 11/24/10 0535  WBC 13.2* 13.7*  NEUTROABS -- --  HGB 14.8 11.8*  HCT 42.8 34.6*  MCV 87.5 91.3  PLT 226 215   Cardiac Enzymes: No results found for this basename: CKTOTAL:3,CKMB:3,CKMBINDEX:3,TROPONINI:3 in the last 72 hours BNP: No results found for this basename: POCBNP:3 in the last 72 hours D-Dimer: No results found for this basename: DDIMER:2 in the last 72 hours CBG: No results found for this basename: GLUCAP:6 in the last 72 hours Hemoglobin A1C: No results found for this basename: HGBA1C in the last 72 hours Fasting Lipid Panel: No results found for this  basename: CHOL,HDL,LDLCALC,TRIG,CHOLHDL,LDLDIRECT in the last 72 hours Thyroid Function Tests: No results found for this basename: TSH,T4TOTAL,FREET4,T3FREE,THYROIDAB in the last 72 hours Anemia Panel:  Basename 11/22/10 1032  VITAMINB12 350  FOLATE 13.4  FERRITIN 215  TIBC 210*  IRON 11*  RETICCTPCT 2.6   Urine Drug Screen:  Alcohol Level: No results found for this basename: ETH:2 in the last 72 hours Urinalysis:  Misc. Labs:  ABGS No results found for this basename: PHART,PCO2,PO2ART,TCO2,HCO3 in the last 72 hours CULTURES No results found for this or any previous visit (from the past 240 hour(s)). Studies/Results: Chest Portable 1 View  11/23/2010  *RADIOLOGY REPORT*  Clinical Data: Preop.  Altered mental status.  Stroke patient. Preop for shoulder surgery.  PORTABLE CHEST - 1 VIEW  Comparison: 11/20/2010  Findings: Heart size is mildly enlarged.  There are perihilar peribronchial changes.  There are no focal consolidations or pleural effusions.  No pulmonary edema.  No evidence for pneumothorax or displaced rib fracture.  Again noted is comminuted proximal right humerus fracture with significant displacement.  IMPRESSION:  1.  Cardiomegaly without pulmonary edema. 2.  Bronchitic changes. 3.  Proximal right humerus fracture.  Original Report Authenticated By: Patterson Hammersmith, M.D.    Medications:  Scheduled:   . amLODipine  10 mg Oral Daily  . ceFAZolin (ANCEF) IV  1 g Intravenous 60 min Pre-Op  . gatifloxacin  1 drop Left Eye QID  . metoprolol tartrate  25 mg Oral BID  . timolol  1 drop Right Eye BID  Continuous:  VHQ:IONGEXBM injection  Assesment: She has fractured humerus. She has had a previous stroke and has significant speech abnormalities related to that. She has hypertension which is stable. Principal Problem:  *Fracture of humerus, distal, right, closed Active Problems:  H/O: stroke with residual effects  Hypertension  Glaucoma    Plan: She is for  surgery today.    LOS: 5 days   Cadence Haslam L 11/25/2010, 8:30 AM

## 2010-11-25 NOTE — Anesthesia Preprocedure Evaluation (Addendum)
Anesthesia Evaluation  Name, MR# and DOB Patient awake  General Assessment Comment  Reviewed: Allergy & Precautions, H&P , NPO status , Patient's Chart, lab work & pertinent test results  Airway Mallampati: I  Neck ROM: Full    Dental  (+) Edentulous Upper and Edentulous Lower   Pulmonary  clear to auscultation  breath sounds clear to auscultation none    Cardiovascular hypertension, Pt. on medications Regular Normal    Neuro/Psych CVA, Residual Symptoms   GI/Hepatic/Renal   Endo/Other    Abdominal   Musculoskeletal   Hematology   Peds  Reproductive/Obstetrics    Anesthesia Other Findings             Anesthesia Physical Anesthesia Plan  ASA: III  Anesthesia Plan: General   Post-op Pain Management:    Induction: Intravenous  Airway Management Planned: Oral ETT  Additional Equipment:   Intra-op Plan:   Post-operative Plan:   Informed Consent: I have reviewed the patients History and Physical, chart, labs and discussed the procedure including the risks, benefits and alternatives for the proposed anesthesia with the patient or authorized representative who has indicated his/her understanding and acceptance.     Plan Discussed with: CRNA  Anesthesia Plan Comments: (Amidate induction)        Anesthesia Quick Evaluation

## 2010-11-25 NOTE — Progress Notes (Signed)
Pt unable to void. Stated she had not voided since surgery. Dr. Juanetta Gosling notified. Order to insert foley. Bladder scan showed >500cc urine. Foley placed. Pt tolerated well. Foley draining amber colored urine.

## 2010-11-26 LAB — BASIC METABOLIC PANEL
BUN: 29 mg/dL — ABNORMAL HIGH (ref 6–23)
Creatinine, Ser: 0.89 mg/dL (ref 0.50–1.10)
GFR calc Af Amer: >60 mL/min (ref >60)
GFR calc non Af Amer: 60 mL/min — ABNORMAL LOW (ref >60)
Potassium: 4.1 mEq/L (ref 3.5–5.1)

## 2010-11-26 LAB — HEMOGLOBIN AND HEMATOCRIT, BLOOD: Hemoglobin: 12.4 g/dL (ref 12.0–15.0)

## 2010-11-26 MED ORDER — MAGNESIUM HYDROXIDE NICU ORAL SYRINGE 400 MG/5 ML
30.0000 mL | Freq: Every day | ORAL | Status: DC | PRN
  Filled 2010-11-26: qty 30

## 2010-11-26 MED ORDER — BISACODYL 10 MG RE SUPP: 10.0000 mg | Freq: Every day | RECTAL | Status: DC | PRN

## 2010-11-26 NOTE — Progress Notes (Signed)
Pt Had large BM right before administration of enema, 1 soap sud enema still completed. Contents expelled were clear.

## 2010-11-26 NOTE — Anesthesia Post-op Follow-up Note (Signed)
VSS.  Sitting on side of bed.  Alert and oriented.  Doing much better today.  No apparent anesthesia complications.

## 2010-11-26 NOTE — Progress Notes (Signed)
Subjective: She says she's feeling okay. She had her surgery yesterday and everything has gone well so far.  Objective: Vital signs in last 24 hours: Temp:  [97 F (36.1 C)-98.6 F (37 C)] 97.4 F (36.3 C) (09/06 0630) Pulse Rate:  [64-98] 85  (09/06 0630) Resp:  [16-26] 20  (09/06 0630) BP: (102-165)/(43-104) 127/70 mmHg (09/06 0630) SpO2:  [92 %-100 %] 99 % (09/06 0630) Weight change:  Last BM Date: 11/21/10  Intake/Output from previous day: 09/05 0701 - 09/06 0700 In: 2197.5 [P.O.:20; I.V.:2177.5] Out: 1050 [Urine:850; Blood:200]  PHYSICAL EXAM General appearance: alert, cooperative and no distress Resp: clear to auscultation bilaterally Cardio: regular rate and rhythm, S1, S2 normal, no murmur, click, rub or gallop GI: soft, non-tender; bowel sounds normal; no masses,  no organomegaly Extremities: extremities normal, atraumatic, no cyanosis or edema She has chronic changes from her previous stroke  Lab Results:    Basic Metabolic Panel:  Basename 11/26/10 0420 11/25/10 0543  NA 134* 133*  K 4.1 3.8  CL 101 102  CO2 25 24  GLUCOSE 157* 111*  BUN 29* 25*  CREATININE 0.89 0.78  CALCIUM 8.2* 8.6  MG -- --  PHOS -- --   Liver Function Tests: No results found for this basename: AST:2,ALT:2,ALKPHOS:2,BILITOT:2,PROT:2,ALBUMIN:2 in the last 72 hours No results found for this basename: LIPASE:2,AMYLASE:2 in the last 72 hours No results found for this basename: AMMONIA:2 in the last 72 hours CBC:  Basename 11/26/10 0420 11/25/10 0543 11/24/10 0535  WBC -- 13.2* 13.7*  NEUTROABS -- -- --  HGB 12.4 14.8 --  HCT 37.7 42.8 --  MCV -- 87.5 91.3  PLT -- 226 215   Cardiac Enzymes: No results found for this basename: CKTOTAL:3,CKMB:3,CKMBINDEX:3,TROPONINI:3 in the last 72 hours BNP: No results found for this basename: POCBNP:3 in the last 72 hours D-Dimer: No results found for this basename: DDIMER:2 in the last 72 hours CBG: No results found for this basename:  GLUCAP:6 in the last 72 hours Hemoglobin A1C: No results found for this basename: HGBA1C in the last 72 hours Fasting Lipid Panel: No results found for this basename: CHOL,HDL,LDLCALC,TRIG,CHOLHDL,LDLDIRECT in the last 72 hours Thyroid Function Tests: No results found for this basename: TSH,T4TOTAL,FREET4,T3FREE,THYROIDAB in the last 72 hours Anemia Panel: No results found for this basename: VITAMINB12,FOLATE,FERRITIN,TIBC,IRON,RETICCTPCT in the last 72 hours Urine Drug Screen:  Alcohol Level: No results found for this basename: ETH:2 in the last 72 hours Urinalysis:  Misc. Labs:  ABGS No results found for this basename: PHART,PCO2,PO2ART,TCO2,HCO3 in the last 72 hours CULTURES No results found for this or any previous visit (from the past 240 hour(s)). Studies/Results: Dg Shoulder Right  11/25/2010  *RADIOLOGY REPORT*  Clinical Data: ORIF humeral fracture  RIGHT SHOULDER - 2+ VIEW  Findings: Multiple C-arm images show localization with subsequent reduction and fixation of a comminuted proximal humeral fracture with plate, multiple screws and cerclage wire.  Components appear grossly well positioned.  No radiographically detectable complication on the slow detail images.  IMPRESSION: ORIF as described  Original Report Authenticated By: Thomasenia Sales, M.D.   Dg C-arm 1-60 Min-no Report  11/25/2010  CLINICAL DATA: right proximal humerus fracture   C-ARM 1-60 MINUTES  Fluoroscopy was utilized by the requesting physician.  No radiographic  interpretation.      Medications:  Scheduled:   . amLODipine  10 mg Oral Daily  . ceFAZolin      . ceFAZolin (ANCEF) IV  1 g Intravenous 60 min Pre-Op  .  ceFAZolin (ANCEF) IV  2 g Intravenous Q6H  . docusate sodium  100 mg Oral BID  . enoxaparin  30 mg Subcutaneous Q12H  . etomidate      . fentaNYL      . gatifloxacin  1 drop Left Eye QID  . glycopyrrolate      . metoprolol tartrate  25 mg Oral BID  . neostigmine      . rocuronium      .  rocuronium      . timolol  1 drop Right Eye BID   Continuous:   . lactated ringers 75 mL/hr at 11/26/10 0600  . DISCONTD: lactated ringers     ZOX:WRUEAVWUJWJXB, acetaminophen, alum & mag hydroxide-simeth, aluminum hydroxide, bisacodyl, bisacodyl, bisacodyl, diphenhydrAMINE, fentaNYL, HYDROcodone-acetaminophen, HYDROmorphone, magnesium hydroxide, magnesium hydroxide, menthol-cetylpyridinium, metoCLOPramide (REGLAN) injection, metoCLOPramide, morphine injection, ondansetron (ZOFRAN) IV, ondansetron (ZOFRAN) IV, ondansetron, oxyCODONE, phenol, polyethylene glycol, sodium phosphate DISCONTD: bupivacaine-EPINEPHrine, DISCONTD: midazolam, DISCONTD: sodium chloride  Assesment: She had fracture of the humerus which is been fixed surgically. She's had a previous stroke. Her Principal Problem:  *Fracture of humerus, distal, right, closed Active Problems:  H/O: stroke with residual effects  Hypertension  Glaucoma    Plan: I discussed her situation with Dr. Romeo Apple and he felt like he needed to stay in the hospital another day and then she could potentially be discharged to a rehabilitation facility    LOS: 6 days   Griffey Nicasio L 11/26/2010, 8:43 AM

## 2010-11-26 NOTE — Progress Notes (Signed)
Physical Therapy Evaluation Patient Name: Carla Bonilla [id number] Date: 11/26/2010 Problem List:  Patient Active Problem List  Diagnoses  . Fracture of humerus, distal, right, closed  . H/O: stroke with residual effects  . Hypertension  . Glaucoma   Past Medical History:  Past Medical History  Diagnosis Date  . CVA (cerebral vascular accident)   . Hypertension   . Blind left eye   . Glaucoma   . Hearing loss    Past Surgical History: History reviewed. No pertinent past surgical history.  Precautions/Restrictions  Precautions Precautions: Shoulder Type of Shoulder Precautions: No A/PROM of shoulder up with sling. Prior Functioning      Cognition   Sensation/Coordination   Extremity Assessment   Mobility (including Balance) Bed Mobility Bed Mobility: Yes Rolling Left: 2: Max assist Supine to Sit: 2: Max assist Sit to Supine - Right: 2: Max assist Transfers Transfers: Yes Squat Pivot Transfers: 2: Max Designer, industrial/product Details (indicate cue type and reason): To and from EOB to [facility] Ambulation/Gait Ambulation/Gait: No Stairs: No Wheelchair Mobility Wheelchair Mobility: No  Posture/Postural Control Posture/Postural Control: Postural limitations Postural Limitations: Needs mod A to sit EOB Exercise  General Exercises - Upper Extremity Elbow Flexion: AROM;15 reps;Supine Elbow Extension: AROM;15 reps;Supine Wrist Flexion: AROM;15 reps;Supine Wrist Extension: AROM;15 reps;Supine Digit Composite Flexion: AROM;15 reps;Supine Composite Extension: AROM;15 reps;Supine Low Level/ICU Exercises Elbow Flexion: AROM;15 reps;Supine Other Exercises Other Exercises: retrograde massage to right hand and digits.  End of Session PT - End of Session Equipment Utilized During Treatment: Gait belt Activity Tolerance: Patient limited by fatigue Patient left: in bed;with call bell in reach;with family/visitor present General Behavior During Session: [facility] for tasks  performed Cognition: Impaired, at baseline PT Assessment/Plan/Recommendation PT Assessment Clinical Impression Statement: Pt did not have any complaints of pain during therapy session.  She is referred for decreased strength and impaired mobility compared to baseline.  Stand Pivot transfer complete with sling on and needed to transfer to [facility] for successful BM.  Needs total assistance for pericare.   PT Recommendation/Assessment: Patient will need skilled PT in the acute care venue PT Problem List: Decreased strength;Decreased activity tolerance;Decreased balance PT Therapy Diagnosis : Difficulty walking;Generalized weakness;Acute pain PT Plan PT Frequency: Min 3X/week PT Treatment/Interventions: Gait training;Stair training;Functional mobility training;Therapeutic exercise PT Recommendation Follow Up Recommendations: Skilled nursing facility PT Goals  Acute Rehab PT Goals PT Goal Formulation: With patient Pt will Roll Supine to Left Side: with mod assist Pt will Sit at [facility] of Bed: with min assist Pt will Stand: with mod assist Pt will Ambulate: 1 - 15 feet;with max assist Carla Bonilla 11/26/2010, 4:53 PM

## 2010-11-26 NOTE — Progress Notes (Signed)
Occupational Therapy Evaluation Patient Name: Carla Bonilla [id number] Date: 11/26/2010 Time in:  955 Time Out:  1016 OT Eval Problem List:  Patient Active Problem List  Diagnoses  . Fracture of humerus, distal, right, closed  . H/O: stroke with residual effects  . Hypertension  . Glaucoma   Past Medical History:  Past Medical History  Diagnosis Date  . CVA (cerebral vascular accident)   . Hypertension   . Blind left eye   . Glaucoma   . Hearing loss    Past Surgical History: History reviewed. No pertinent past surgical history.  Precautions/Restrictions  Precautions Precautions: Shoulder Type of Shoulder Precautions: No A/PROM of shoulder up with sling. Restrictions Weight Bearing Restrictions: No Prior Functioning  Home Living Type of Home: House Lives With: Other (Comment) (caregiver) Receives Help From: Personal care attendant Home Layout: One level Home Access: Level entry Bathroom Shower/Tub: Engineer, manufacturing systems: Handicapped height Home Adaptive Equipment: Bedside commode/3-in-1;Grab bars in shower Prior Function Level of Independence: Independent with basic ADLs;Needs assistance with homemaking Driving: No ADL ADL Eating/Feeding: Set up Grooming: Minimal assistance Upper Body Bathing: Moderate assistance Lower Body Bathing: Maximal assistance Upper Body Dressing: Moderate assistance Lower Body Dressing: Maximal assistance Vision/Perception  Vision - History Baseline Vision: Wears glasses all the time Cognition Cognition Arousal/Alertness: Awake/alert Overall Cognitive Status: Appears within functional limits for tasks assessed Orientation Level: Oriented X4 Sensation/Coordination Sensation Light Touch: Appears Intact Extremity Assessment RUE AROM (degrees) Overall AROM Right Upper Extremity: Deficits RUE Overall AROM Comments: Right shoulder not assessed due to recent surgery.  Elbow, forearm, wrist, and hand AROM is WFL. LUE  Assessment LUE Assessment: Within Functional Limits Mobility   Not Assessed  Exercises General Exercises - Upper Extremity Elbow Flexion: AROM;10 reps;Supine Elbow Extension: AROM;10 reps;Supine Wrist Flexion: AROM;10 reps;Supine Wrist Extension: AROM;10 reps;Supine Digit Composite Flexion: AROM;10 reps;Supine Composite Extension: AROM;10 reps;Supine Low Level/ICU Exercises Elbow Flexion: AROM;10 reps;Supine  End of Session OT - End of Session Equipment Utilized During Treatment: Other (comment) (ice pack on right shoulder.  RN to obtain clean sling & apply) Activity Tolerance: Patient tolerated treatment well Patient left: in bed;with family/visitor present Nurse Communication: Other (comment) (Keep shoulder immobile, use sling) General Behavior During Session: [facility] for tasks performed Cognition: [facility] for tasks performed OT Assessment/Plan/Recommendation OT Assessment Clinical Impression Statement: A:  Patient presents with increased pain and decreased mobility in right shoulder status post proximal humerus fracture and surgery to repair.   OT Recommendation/Assessment: Patient will need skilled OT in the acute care venue OT Problem List: Decreased range of motion;Decreased activity tolerance;Impaired UE functional use;Pain Problem List Comments: Patient will benefit from skilled occupational therapy to increase independence with BADLs. OT Therapy Diagnosis : Acute pain OT Plan OT Frequency: Min 2X/week OT Treatment/Interventions: Self-care/ADL training;Therapeutic exercise;Therapeutic activities;Patient/family education (educated on AROM of elbow, forearm, wrist, and hand) OT Recommendation Follow Up Recommendations: Skilled nursing facility Equipment Recommended: Defer to next venue Individuals Consulted Consulted and Agree with Results and Recommendations: Patient OT Goals Acute Rehab OT Goals OT Goal Formulation: With patient Time For Goal Achievement: 2 weeks ADL  Goals Pt Will Perform Upper Body Dressing: with set-up Pt Will Perform Lower Body Dressing: with min assist Arm Goals Pt Will Perform AROM: with supervision, verbal cues required/provided;Right upper extremity (elbow, forearm, wrist, and hand)  Shirlean Mylar, OTR/L  11/26/2010, 12:02 PM

## 2010-11-26 NOTE — Progress Notes (Signed)
Subjective: 1 Day Post-Op Procedure(s) (LRB): OPEN REDUCTION INTERNAL FIXATION (ORIF) PROXIMAL HUMERUS FRACTURE (Right) Patient reports pain as mild.    Objective: Vital signs in last 24 hours: Temp:  [97 F (36.1 C)-99.1 F (37.3 C)] 97.4 F (36.3 C) (09/06 0630) Pulse Rate:  [64-98] 85  (09/06 0630) Resp:  [16-26] 20  (09/06 0630) BP: (102-165)/(43-104) 127/70 mmHg (09/06 0630) SpO2:  [92 %-100 %] 99 % (09/06 0630)  Intake/Output from previous day: 09/05 0701 - 09/06 0700 In: 2197.5 [P.O.:20; I.V.:2177.5] Out: 1050 [Urine:850; Blood:200] Intake/Output this shift:     Basename 11/26/10 0420 11/25/10 0543 11/24/10 0535  HGB 12.4 14.8 11.8*    Basename 11/26/10 0420 11/25/10 0543 11/24/10 0535  WBC -- 13.2* 13.7*  RBC -- 4.89 3.79*  HCT 37.7 42.8 --  PLT -- 226 215    Basename 11/26/10 0420 11/25/10 0543  NA 134* 133*  K 4.1 3.8  CL 101 102  CO2 25 24  BUN 29* 25*  CREATININE 0.89 0.78  GLUCOSE 157* 111*  CALCIUM 8.2* 8.6    Basename 11/25/10 0543 11/24/10 0535  LABPT -- --  INR 1.15 1.12    WRIST EXTENSION NORMAL , MCP EXTENSION NORMAL FINGER EXT NORMAL AND THUMB FLEXION NORMAL INDEX FLEXION NORMAL   Assessment/Plan: 1 Day Post-Op Procedure(s) (LRB): OPEN REDUCTION INTERNAL FIXATION (ORIF) PROXIMAL HUMERUS FRACTURE (Right) Up with therapy  Fuller Canada 11/26/2010, 7:47 AM

## 2010-11-26 NOTE — Addendum Note (Signed)
Addendum  created 11/26/10 1426 by Glynn Octave   Modules edited:Anesthesia Events, Notes Section

## 2010-11-26 NOTE — Op Note (Signed)
NAME:  Carla Bonilla, Carla Bonilla NO.:  [id number]  MEDICAL RECORD NO.:  [id number]  LOCATION:  A316                          FACILITY:  [facility]  PHYSICIAN:  Vickki Hearing, M.D.DATE OF BIRTH:  04-02-1920  DATE OF PROCEDURE:  11/25/2010 DATE OF DISCHARGE:                              OPERATIVE REPORT   PREOPERATIVE DIAGNOSIS:  Closed fracture right proximal humerus.  POSTOPERATIVE DIAGNOSIS:  Closed fracture right proximal humerus.  PROCEDURE:  Open reduction and internal fixation with Synthes proximal humeral plate two #19 gauge surgical wires and 10 mL of bone chips and 1 mL of bone putty.  SURGEON:  Vickki Hearing, MD.  ANESTHETIC:  General assisted by [place].  OPERATIVE FINDINGS:  Comminuted proximal humerus fracture in four parts which included one head fragment and two butterfly fragments and remaining shaft.  There was also significant metaphyseal comminution and osteoporosis.  The patient was admitted to the hospital on November 20, 2010, after falling and injuring her right shoulder.  She was placed on the Medical Service.  She was on Plavix which was stopped.  She subsequently had a hemoglobin of 7 received 4 units of blood, hemoglobin improved to 14 and after medical consultation, she was stabilized for surgery.  Site marking was performed in the preop area over the right shoulder which was confirmed as the surgical site.  The patient was brought back to the surgical suite where she had general anesthesia.  She was placed in a modified beach chair position.  C-arm was brought in to confirm.  X- rays could be obtained in multiple planes, a right arm was then prepped and draped with sterile technique and time-out was then executed.  A deltopectoral approach to the proximal shoulder was performed. Subcutaneous tissue was divided.  Cephalic vein was isolated and retracted laterally.  The interval between the pectoralis major and deltoid was  explored with blunt dissection down to the underlying clavipectoral fascia which was incised.  The incision was carried down through the interval between the biceps and the brachialis to expose the fracture site.  Once the fracture site was identified, irrigated and debrided, the pectoralis tendon was partially released.  The deltoid muscle was released from its insertion.  The fracture was reduced and held with bone clamps and radiographs confirmed reduction.  The plate was then applied to the humerus which took several attempts at plate position until a proper plate position was obtained using an x- ray.  This was held in place with one 3.5-mm bone screw in a nonlocking fashion to bring the bone to the plate and then a calcar screw and one head screw were placed and x-rays were confirmed.  Several head screws were placed followed by several nonlocking screws and locking screws distally.  The metaphysis heel area was comminuted and could not be fixated with screws.  It was therefore bone grafted and cerclage wire was placed.  After wound irrigation and x-rays to confirm reduction and plate position, the wounds were irrigated and closed with 0 Monocryl.  We confirmed that the arm moved as 1 unit before closure.  Skin staples were used to reapproximate the skin edges.  The patient was placed  in a shoulder immobilizer.     Vickki Hearing, M.D.     SEH/MEDQ  D:  11/25/2010  T:  11/26/2010  Job:  [id number]

## 2010-11-26 NOTE — Progress Notes (Signed)
Occupational Therapy Treatment Patient Name: Carla Bonilla Date: 11/26/2010 Problem List:  Patient Active Problem List  Diagnoses  . Fracture of humerus, distal, right, closed  . H/O: stroke with residual effects  . Hypertension  . Glaucoma   Past Medical History:  Past Medical History  Diagnosis Date  . CVA (cerebral vascular accident)   . Hypertension   . Blind left eye   . Glaucoma   . Hearing loss    Past Surgical History: History reviewed. No pertinent past surgical history.  Precautions/Restrictions  Precautions Precautions: Shoulder Type of Shoulder Precautions: No A/PROM of shoulder up with sling.   ADL   Mobility    Exercises General Exercises - Upper Extremity Elbow Flexion: AROM;15 reps;Supine Elbow Extension: AROM;15 reps;Supine Wrist Flexion: AROM;15 reps;Supine Wrist Extension: AROM;15 reps;Supine Digit Composite Flexion: AROM;15 reps;Supine Composite Extension: AROM;15 reps;Supine Low Level/ICU Exercises Elbow Flexion: AROM;15 reps;Supine Other Exercises Other Exercises: retrograde massage to right hand and digits.  End of Session OT - End of Session Equipment Utilized During Treatment: Other (comment) (ice pack on right shoulder.  RN to obtain clean sling & appl) Activity Tolerance: Patient tolerated treatment well Patient left: in bed;with family/visitor present Nurse Communication: Other (comment) (keep shoulder immobilized) General Behavior During Session: [facility] for tasks performed Cognition: [facility] for tasks performed OT Assessment/Plan OT Assessment/Plan Comments on Treatment Session: Patients hand very swollen, patient received retrograde mannage and educated patient to complete AROM of wrist flexor/extensor/ digit flexor/extensor and digit adduction/abduction 20 reps every half hour to help decrease edema.  Patients had with noticable decrease in edema at end of treatment session. OT Plan: Discharge plan remains appropriate OT  Frequency: Min 2X/week Follow Up Recommendations: Skilled nursing facility Equipment Recommended: Defer to next venue OT Goals Acute Rehab OT Goals OT Goal Formulation: With patient Time For Goal Achievement: 2 weeks ADL Goals Pt Will Perform Upper Body Dressing: with set-up ADL Goal: Upper Body Dressing - Progress: Progressing toward goals Pt Will Perform Lower Body Dressing: with min assist ADL Goal: Lower Body Dressing - Progress: Progressing toward goals Arm Goals Pt Will Perform AROM: with supervision, verbal cues required/provided;Right upper extremity (elbow, forearm, wrist, and hand) Arm Goal: AROM - Progress: Progressing toward goal   Meganne Rita L. Cailean Heacock, COTA/L  11/26/2010, 3:52 PM

## 2010-11-27 ENCOUNTER — Inpatient Hospital Stay
Admission: RE | Admit: 2010-11-27 | Discharge: 2011-05-21 | Disposition: A | Discharge status: Home Health | Payer: PRIVATE HEALTH INSURANCE | Source: Ambulatory Visit | Attending: Pulmonary Disease | Admitting: Pulmonary Disease

## 2010-11-27 LAB — TYPE AND SCREEN
Unit division: 0
Unit division: 0
Unit division: 0
Unit division: 0

## 2010-11-27 NOTE — Progress Notes (Signed)
Discharge summary: pt a/o. Vss. Saline lock removed. Up with assistance. Sling to right arm intact. Dressing changed today during shift. Foley d/c. No complaints of distress. Report called to Terance Hart, LPN at [facility]. Pt left floor via wheelchair with nursing assistant and family members.

## 2010-11-27 NOTE — Progress Notes (Signed)
Subjective: She seems to be doing better in general. She still has problems with her speech and she's had some drainage from the surgical site  Objective: Vital signs in last 24 hours: Temp:  [97.6 F (36.4 C)-98.7 F (37.1 C)] 97.7 F (36.5 C) (09/07 0815) Pulse Rate:  [80-93] 80  (09/07 0815) Resp:  [20-22] 20  (09/07 0815) BP: (102-122)/(59-86) 102/61 mmHg (09/07 0815) SpO2:  [96 %-100 %] 99 % (09/07 0815) Weight change:  Last BM Date: 11/22/10  Intake/Output from previous day: 09/06 0701 - 09/07 0700 In: 360 [P.O.:360] Out: 351 [Urine:350; Stool:1]  PHYSICAL EXAM General appearance: alert, cooperative and Dysarthric Resp: clear to auscultation bilaterally Cardio: regular rate and rhythm, S1, S2 normal, no murmur, click, rub or gallop GI: soft, non-tender; bowel sounds normal; no masses,  no organomegaly Extremities: Some drainage from the surgical site  Lab Results:    Basic Metabolic Panel:  Basename 11/26/10 0420 11/25/10 0543  NA 134* 133*  K 4.1 3.8  CL 101 102  CO2 25 24  GLUCOSE 157* 111*  BUN 29* 25*  CREATININE 0.89 0.78  CALCIUM 8.2* 8.6  MG -- --  PHOS -- --   Liver Function Tests: No results found for this basename: AST:2,ALT:2,ALKPHOS:2,BILITOT:2,PROT:2,ALBUMIN:2 in the last 72 hours No results found for this basename: LIPASE:2,AMYLASE:2 in the last 72 hours No results found for this basename: AMMONIA:2 in the last 72 hours CBC:  Basename 11/26/10 0420 11/25/10 0543  WBC -- 13.2*  NEUTROABS -- --  HGB 12.4 14.8  HCT 37.7 42.8  MCV -- 87.5  PLT -- 226   Cardiac Enzymes: No results found for this basename: CKTOTAL:3,CKMB:3,CKMBINDEX:3,TROPONINI:3 in the last 72 hours BNP: No results found for this basename: POCBNP:3 in the last 72 hours D-Dimer: No results found for this basename: DDIMER:2 in the last 72 hours CBG: No results found for this basename: GLUCAP:6 in the last 72 hours Hemoglobin A1C: No results found for this basename:  HGBA1C in the last 72 hours Fasting Lipid Panel: No results found for this basename: CHOL,HDL,LDLCALC,TRIG,CHOLHDL,LDLDIRECT in the last 72 hours Thyroid Function Tests: No results found for this basename: TSH,T4TOTAL,FREET4,T3FREE,THYROIDAB in the last 72 hours Anemia Panel: No results found for this basename: VITAMINB12,FOLATE,FERRITIN,TIBC,IRON,RETICCTPCT in the last 72 hours Urine Drug Screen:  Alcohol Level: No results found for this basename: ETH:2 in the last 72 hours Urinalysis:  Misc. Labs:  ABGS No results found for this basename: PHART,PCO2,PO2ART,TCO2,HCO3 in the last 72 hours CULTURES No results found for this or any previous visit (from the past 240 hour(s)). Studies/Results: Dg Shoulder Right  11/25/2010  *RADIOLOGY REPORT*  Clinical Data: ORIF humeral fracture  RIGHT SHOULDER - 2+ VIEW  Findings: Multiple C-arm images show localization with subsequent reduction and fixation of a comminuted proximal humeral fracture with plate, multiple screws and cerclage wire.  Components appear grossly well positioned.  No radiographically detectable complication on the slow detail images.  IMPRESSION: ORIF as described  Original Report Authenticated By: Thomasenia Sales, M.D.   Dg C-arm 1-60 Min-no Report  11/25/2010  CLINICAL DATA: right proximal humerus fracture   C-ARM 1-60 MINUTES  Fluoroscopy was utilized by the requesting physician.  No radiographic  interpretation.      Medications:  Prior to Admission:  Prescriptions prior to admission  Medication Sig Dispense Refill  . amLODipine-benazepril (LOTREL) 10-20 MG per capsule Take 1 capsule by mouth daily.        Marland Kitchen aspirin EC 81 MG tablet Take 81 mg  by mouth daily.        . clopidogrel (PLAVIX) 75 MG tablet Take 75 mg by mouth daily.        . metoprolol tartrate (LOPRESSOR) 25 MG tablet Take 25 mg by mouth 2 (two) times daily.        Marland Kitchen moxifloxacin (VIGAMOX) 0.5 % ophthalmic solution Place 1 drop into the left eye 2 (two) times  daily.        . timolol (TIMOPTIC) 0.25 % ophthalmic solution Place 1 drop into the right eye 2 (two) times daily.         Scheduled:   . amLODipine  10 mg Oral Daily  . docusate sodium  100 mg Oral BID  . enoxaparin  30 mg Subcutaneous Q12H  . gatifloxacin  1 drop Left Eye QID  . metoprolol tartrate  25 mg Oral BID  . timolol  1 drop Right Eye BID   Continuous:   . lactated ringers 1,000 application (11/26/10 1640)   RUE:[id number], acetaminophen, alum & mag hydroxide-simeth, aluminum hydroxide, bisacodyl, bisacodyl, bisacodyl, diphenhydrAMINE, fentaNYL, HYDROcodone-acetaminophen, HYDROmorphone, magnesium hydroxide, magnesium hydroxide, menthol-cetylpyridinium, metoCLOPramide (REGLAN) injection, metoCLOPramide, morphine injection, ondansetron (ZOFRAN) IV, ondansetron, oxyCODONE, phenol, polyethylene glycol, sodium phosphate  Assesment: She is much improved in general. She had her surgery done and she may be able to be transferred to a skilled care facility. Principal Problem:  *Fracture of humerus, distal, right, closed Active Problems:  H/O: stroke with residual effects  Hypertension  Glaucoma    Plan: As above    LOS: 7 days   Ferne Ellingwood L 11/27/2010, 8:43 AM

## 2010-11-27 NOTE — Discharge Summary (Signed)
Physician Discharge Summary  Patient ID: Carla Bonilla MRN: [id number] DOB/AGE: 1920/09/01 77 y.o. Primary Care Physician:No primary provider on file. Admit date: 11/20/2010 Discharge date: 11/27/2010    Discharge Diagnoses:  Principal Problem:  *Fracture of humerus, distal, right, closed Active Problems:  H/O: stroke with residual effects  Hypertension  Glaucoma   Current Discharge Medication List    CONTINUE these medications which have NOT CHANGED   Details  amLODipine-benazepril (LOTREL) 10-20 MG per capsule Take 1 capsule by mouth daily.      aspirin EC 81 MG tablet Take 81 mg by mouth daily.      clopidogrel (PLAVIX) 75 MG tablet Take 75 mg by mouth daily.      metoprolol tartrate (LOPRESSOR) 25 MG tablet Take 25 mg by mouth 2 (two) times daily.      moxifloxacin (VIGAMOX) 0.5 % ophthalmic solution Place 1 drop into the left eye 2 (two) times daily.      timolol (TIMOPTIC) 0.25 % ophthalmic solution Place 1 drop into the right eye 2 (two) times daily.          Discharged Condition: Improved    Consults: Dr. Romeo Apple  Significant Diagnostic Studies: Dg Chest 1 View  11/20/2010  *RADIOLOGY REPORT*  Clinical Data: Humeral fracture, fall  CHEST - 1 VIEW  Comparison: None  Findings: Upper normal heart size. Atherosclerotic calcification aorta. Pulmonary vascularity normal. Subsegmental atelectasis left base. Lungs otherwise clear. No pleural effusion or pneumothorax. Comminuted significantly displaced fracture of the proximal right humeral metadiaphysis. No additional fractures seen.  IMPRESSION: Minimal subsegmental atelectasis left base. Comminuted significantly displaced proximal right humeral metadiaphyseal fracture.  Original Report Authenticated By: Lollie Marrow, M.D.   Dg Shoulder Right  11/25/2010  *RADIOLOGY REPORT*  Clinical Data: ORIF humeral fracture  RIGHT SHOULDER - 2+ VIEW  Findings: Multiple C-arm images show localization with subsequent reduction and  fixation of a comminuted proximal humeral fracture with plate, multiple screws and cerclage wire.  Components appear grossly well positioned.  No radiographically detectable complication on the slow detail images.  IMPRESSION: ORIF as described  Original Report Authenticated By: Thomasenia Sales, M.D.   Dg Shoulder Right  11/20/2010  *RADIOLOGY REPORT*  Clinical Data: Pain post fall, history of stroke affecting right side of body  RIGHT SHOULDER - 2+ VIEW  Comparison: None  Findings: Osseous demineralization. AC joint alignment normal. Comminuted significantly displaced fracture of the proximal right humeral metadiaphysis. Fracture extends into the base of the right humeral head. No dislocation identified. Question additional fragment from the margin of the humeral head on the scapular Y view.  IMPRESSION: Comminuted displaced proximal right humeral fracture as above without dislocation.  Original Report Authenticated By: Lollie Marrow, M.D.   Dg Forearm Right  11/20/2010  *RADIOLOGY REPORT*  Clinical Data: Pain, scrapes and bruising at right forearm post fall  RIGHT FOREARM - 2 VIEW  Comparison: None  Findings: Diffuse osseous demineralization. Wrist and elbow joint alignments normal. No acute fracture, dislocation, or bone destruction.  IMPRESSION: No acute osseous abnormalities.  Original Report Authenticated By: Lollie Marrow, M.D.   Dg Hip Complete Right  11/20/2010  *RADIOLOGY REPORT*  Clinical Data: Right hip pain post fall  RIGHT HIP - COMPLETE 2+ VIEW  Comparison: None  Findings: Diffuse osseous demineralization. Degenerative disc and facet disease changes lower lumbar spine. Levoconvex scoliosis noted. Symmetric SI joints. Mild left hip joint space narrowing. Marked degenerative changes of the right hip joint with joint space obliteration, subchondral  sclerosis with minimal cyst formation, extensive spur formation, and superolateral subluxation of the right femoral head. Right femoral head is  flattened and deformed. No definite acute fracture or dislocation identified. Prominent stool in rectum. Difficult to exclude abnormalities at the pubic rami due to the degree of demineralization and superimposed stool.  IMPRESSION: Advanced osteoarthritic changes of the right hip joint as above. Degenerative disc and facet disease changes of lumbar spine with levoconvex scoliosis. No definite acute bony abnormality identified.  Original Report Authenticated By: Lollie Marrow, M.D.   Ct Head Wo Contrast  11/20/2010  *RADIOLOGY REPORT*  Clinical Data:  Fall.  Head and neck pain.  CT HEAD WITHOUT CONTRAST CT CERVICAL SPINE WITHOUT CONTRAST  Technique:  Multidetector CT imaging of the head and cervical spine was performed following the standard protocol without intravenous contrast.  Multiplanar CT image reconstructions of the cervical spine were also generated.  Comparison:  05/07/2008 [facility] hospital.  CT HEAD  Findings: No skull fracture or intracranial hemorrhage.  Remote infarct with encephalomalacia left frontal lobe and right parietal lobe.  Prominent small vessel disease type changes. No CT evidence of large acute infarct.  Small acute infarct cannot be excluded by CT.  Age related atrophy without hydrocephalus.  Small vessel disease type changes.  No intracranial mass lesion detected on this unenhanced exam.  Vascular calcifications.  Tiny radiopaque structure posterior aspect of the left globe unchanged.  Exophthalmos.  IMPRESSION: No skull fracture or intracranial hemorrhage.  Remote infarcts and small vessel disease type changes as detailed above.  CT CERVICAL SPINE  Findings: No cervical spine fracture.  Anterior slip of C3 as noted previously.  Cervical spondylotic changes with various degrees of spinal stenosis and foraminal narrowing most prominent on the right at the C5-6 level.  No abnormal prevertebral soft tissue swelling.  IMPRESSION: No cervical spine fracture.  Cervical spondylotic changes  as noted above.  Original Report Authenticated By: Fuller Canada, M.D.   Ct Cervical Spine Wo Contrast  11/20/2010  *RADIOLOGY REPORT*  Clinical Data:  Fall.  Head and neck pain.  CT HEAD WITHOUT CONTRAST CT CERVICAL SPINE WITHOUT CONTRAST  Technique:  Multidetector CT imaging of the head and cervical spine was performed following the standard protocol without intravenous contrast.  Multiplanar CT image reconstructions of the cervical spine were also generated.  Comparison:  05/07/2008 [place] [facility].  CT HEAD  Findings: No skull fracture or intracranial hemorrhage.  Remote infarct with encephalomalacia left frontal lobe and right parietal lobe.  Prominent small vessel disease type changes. No CT evidence of large acute infarct.  Small acute infarct cannot be excluded by CT.  Age related atrophy without hydrocephalus.  Small vessel disease type changes.  No intracranial mass lesion detected on this unenhanced exam.  Vascular calcifications.  Tiny radiopaque structure posterior aspect of the left globe unchanged.  Exophthalmos.  IMPRESSION: No skull fracture or intracranial hemorrhage.  Remote infarcts and small vessel disease type changes as detailed above.  CT CERVICAL SPINE  Findings: No cervical spine fracture.  Anterior slip of C3 as noted previously.  Cervical spondylotic changes with various degrees of spinal stenosis and foraminal narrowing most prominent on the right at the C5-6 level.  No abnormal prevertebral soft tissue swelling.  IMPRESSION: No cervical spine fracture.  Cervical spondylotic changes as noted above.  Original Report Authenticated By: Fuller Canada, M.D.   Chest Portable 1 View  11/23/2010  *RADIOLOGY REPORT*  Clinical Data: Preop.  Altered mental status.  Stroke patient. Preop for shoulder surgery.  PORTABLE CHEST - 1 VIEW  Comparison: 11/20/2010  Findings: Heart size is mildly enlarged.  There are perihilar peribronchial changes.  There are no focal consolidations or pleural  effusions.  No pulmonary edema.  No evidence for pneumothorax or displaced rib fracture.  Again noted is comminuted proximal right humerus fracture with significant displacement.  IMPRESSION:  1.  Cardiomegaly without pulmonary edema. 2.  Bronchitic changes. 3.  Proximal right humerus fracture.  Original Report Authenticated By: Patterson Hammersmith, M.D.   Dg C-arm 1-60 Min-no Report  11/25/2010  CLINICAL DATA: right proximal humerus fracture   C-ARM 1-60 MINUTES  Fluoroscopy was utilized by the requesting physician.  No radiographic  interpretation.      Lab Results: Results for orders placed during the hospital encounter of 11/20/10 (from the past 48 hour(s))  HEMOGLOBIN AND HEMATOCRIT, BLOOD     Status: Normal   Collection Time   11/26/10  4:20 AM      Component Value Range Comment   Hemoglobin 12.4  12.0 - 15.0 (g/dL)    HCT 13.0  86.5 - 78.4 (%)   BASIC METABOLIC PANEL     Status: Abnormal   Collection Time   11/26/10  4:20 AM      Component Value Range Comment   Sodium 134 (*) 135 - 145 (mEq/L)    Potassium 4.1  3.5 - 5.1 (mEq/L)    Chloride 101  96 - 112 (mEq/L)    CO2 25  19 - 32 (mEq/L)    Glucose, Bld 157 (*) 70 - 99 (mg/dL)    BUN 29 (*) 6 - 23 (mg/dL)    Creatinine, Ser 6.96  0.50 - 1.10 (mg/dL)    Calcium 8.2 (*) 8.4 - 10.5 (mg/dL)    GFR calc non Af Amer 60 (*) >60 (mL/min)    GFR calc Af Amer >60  >60 (mL/min)    No results found for this or any previous visit (from the past 240 hour(s)).   Hospital Course: She was admitted with a fracture. She was medically stabilized and had open reduction internal fixation of the distal humeral fracture. She did well postoperatively and was ready for transfer to a skilled care facility for rehabilitation.  Discharge Exam: Blood pressure 102/61, pulse 80, temperature 97.7 F (36.5 C), temperature source Oral, resp. rate 20, height 5\' 3"  (1.6 m), weight 66.8 kg (147 lb 4.3 oz), SpO2 99.00%. She has some drainage from the surgical wound  she has some swelling of her right hand otherwise okay  Disposition: Transfer to skilled care facility. She will need PT OT and speech as needed. Her medications are as listed. Plan will be for her eventually to transfer back home with home health services.      Signed: Jase Reep L 11/27/2010, 8:46 AM

## 2010-11-27 NOTE — Progress Notes (Signed)
  CARE FOR SHOULDER  RIGHT SHOULDER IMMOBILIZER AT ALL TIMES EXCEPT TO CHANGE DRESSING AND OT (HAND, WRIST, AND ELBOW)  OT/ NO SHOULDER EXERCISES X 6 WEEKS HAND / WRIST AND ELBOW EXERCISES : PRE'S ROM ACTIVE  STAPLES OUT POD # 12 (9/17)  DRESSING CHANGE DAILY ZEROFORM AND DSD

## 2010-11-27 NOTE — Consult Note (Signed)
Pt to D/C today to [facility].  Spoke with Pt and staff at facility.  Both are in agreement with plan.  Pt will be transported by hospital staff.  CSW will sign off at this time.

## 2010-11-29 NOTE — H&P (Signed)
NAME:  Carla Bonilla, Carla Bonilla             ACCOUNT NO.:  [id number]  MEDICAL RECORD NO.:  [id number]  LOCATION:  S160                          FACILITY:  [facility]  PHYSICIAN:  Ravyn Nikkel L. Juanetta Gosling, M.D.DATE OF BIRTH:  05-Nov-1920  DATE OF ADMISSION:  11/27/2010 DATE OF DISCHARGE:  LH                             HISTORY & PHYSICAL   The patient at the [facility].  REASON FOR ADMISSION:  Fractured humerus.  HISTORY:  Ms. Carla Bonilla is an 77 year old who had a severe stroke several years ago that has left her significantly dysarthric.  She made recovery from most of the other effects of her stroke but still has some residual hemiparesis which is much better.  She suffered a fractured humerus which was surgically repaired by Dr. Romeo Apple and she is at the [facility] for rehabilitation.  PAST MEDICAL HISTORY:  Positive for stroke, hypertension, glaucoma.  FAMILY HISTORY:  Noncontributory.  SOCIAL HISTORY:  She has been living at home alone.  She does not smoke. She does not use alcohol.  REVIEW OF SYSTEMS:  Except as mentioned is negative.  PHYSICAL EXAMINATION:  GENERAL:  A well-developed, well-nourished female who does not appear to be in any acute distress.  She is awake and alert.  Her speech is difficult to understand. CHEST:  Clear without wheezes, rales, or rhonchi. HEART:  Regular without murmur, gallop, or rub. ABDOMEN:  Soft without masses. EXTREMITIES:  Her right upper extremity has a surgical dressing. CNS:  Residual from her stroke.  ASSESSMENT:  She is at the [facility] for rehabilitation for her fracture.  PLAN:  Plan is to have her undergo rehab continue with all of her other treatments and follow.     Lenzy Kerschner L. Juanetta Gosling, M.D.     ELH/MEDQ  D:  11/28/2010  T:  11/28/2010  Job:  [id number]

## 2010-11-30 ENCOUNTER — Ambulatory Visit: Payer: Medicare Other | Admitting: Orthopedic Surgery

## 2010-11-30 ENCOUNTER — Telehealth: Payer: Self-pay | Admitting: Orthopedic Surgery

## 2010-11-30 NOTE — Telephone Encounter (Signed)
Clydie Braun, nurse at [facility] called about increased swelling and red area at incision site and requested appointment.  Appt scheduled for today, unless any other recommendation.  Ph# [phone].

## 2010-12-02 NOTE — Telephone Encounter (Signed)
xrays and f/u appt on 9/18

## 2010-12-02 NOTE — Telephone Encounter (Signed)
[facility] and patient's caregiver relayed that on 11/30/10, Dr. Romeo Apple saw patient at [facility]. 12/02/10 Mavis from [facility] called back to follow up on orders.  Discharge summary does not indicate when patient is to be seen in office and Mavis states she does not have any further orders in patient's chart.  Please advise.  Ph# there is [phone].

## 2010-12-08 ENCOUNTER — Encounter: Payer: Self-pay | Admitting: Orthopedic Surgery

## 2010-12-08 ENCOUNTER — Ambulatory Visit (INDEPENDENT_AMBULATORY_CARE_PROVIDER_SITE_OTHER): Payer: Medicare Other | Admitting: Orthopedic Surgery

## 2010-12-08 ENCOUNTER — Telehealth: Payer: Self-pay | Admitting: Orthopedic Surgery

## 2010-12-08 DIAGNOSIS — S4290XA Fracture of unspecified shoulder girdle, part unspecified, initial encounter for closed fracture: Secondary | ICD-10-CM | POA: Insufficient documentation

## 2010-12-08 DIAGNOSIS — S42209A Unspecified fracture of upper end of unspecified humerus, initial encounter for closed fracture: Secondary | ICD-10-CM

## 2010-12-08 DIAGNOSIS — Z9889 Other specified postprocedural states: Secondary | ICD-10-CM

## 2010-12-08 NOTE — Telephone Encounter (Signed)
Kathie Rhodes, nurse from [facility], [place] [phone], called with question on order received today, following patient's visit here.  "Is it both legs for No Therapy"?  * Dr. Romeo Apple was here as I was speaking with [facility] and has answered this question:   It applies to both legs - he said to leave legs alone.    (Patient's next appointment is 01/05/11)  Called back to [facility] and relayed to [place].

## 2010-12-08 NOTE — Progress Notes (Signed)
Postop visit.  Date of surgery September 5.  Treatment open treatment internal fixation, RIGHT shoulder, with bone graft and locking plate.  Today, x-rays and wound check.  Patient is on oral antibiotics for drainage from the wound.  The patient is using her estranged from the wound. The skin edges are reapproximated well. Dressing was changed.  Radiographs were obtained after is in stable position with locking plate.  Plan is to continue immobilization for 12 weeks total. Dressing changes were ordered.  Separate x-ray report [facility] 2 views of the RIGHT shoulder.  Plate fixation with locking plate. Bone graft. Stable configuration of the comminuted fracture. There also 2 cable wires proximally.  Overall, stable fixation.  Visual follow-up in 4 weeks.

## 2010-12-18 LAB — URINE CULTURE
Colony Count: NO GROWTH
Culture: NO GROWTH
Special Requests: NEGATIVE

## 2010-12-18 LAB — URINALYSIS, ROUTINE W REFLEX MICROSCOPIC
Bilirubin Urine: NEGATIVE
Bilirubin Urine: NEGATIVE
Glucose, UA: 250 — AB
Glucose, UA: NEGATIVE
Specific Gravity, Urine: 1.01
Urobilinogen, UA: 0.2
pH: 7
pH: 7

## 2010-12-18 LAB — DIFFERENTIAL
Basophils Relative: 1
Eosinophils Absolute: 0.2
Monocytes Absolute: 0.6
Monocytes Relative: 7
Neutrophils Relative %: 75

## 2010-12-18 LAB — COMPREHENSIVE METABOLIC PANEL
ALT: 15
Albumin: 3.5
Alkaline Phosphatase: 98
Chloride: 114 — ABNORMAL HIGH
Glucose, Bld: 148 — ABNORMAL HIGH
Potassium: 3.6
Sodium: 141
Total Bilirubin: 0.7
Total Protein: 6.4

## 2010-12-18 LAB — URINE MICROSCOPIC-ADD ON

## 2010-12-18 LAB — CBC
Hemoglobin: 14.3
RDW: 13.9
WBC: 8.6

## 2010-12-18 LAB — LIPID PANEL: Cholesterol: 159

## 2010-12-28 ENCOUNTER — Encounter (HOSPITAL_COMMUNITY): Payer: Self-pay | Admitting: Orthopedic Surgery

## 2011-01-02 NOTE — Progress Notes (Signed)
NAME:  Carla Bonilla, Carla Bonilla             ACCOUNT NO.:  [id number]  MEDICAL RECORD NO.:  [id number]  LOCATION:  S102                          FACILITY:  [facility]  PHYSICIAN:  Maevis Mumby L. Juanetta Gosling, M.D.DATE OF BIRTH:  1921/01/30  DATE OF PROCEDURE: DATE OF DISCHARGE:                                PROGRESS NOTE   Carla Bonilla is in the [facility] with problems from a fractured humerus and also had some problems with her leg.  She is doing better in general, seems to be improving slowly.  She has also had a severe stroke in the past which had left her with significant problems with speech.  Her exam shows that her arm is in a sling.  It is less swollen than it was.  Her chest is clear.  Her speech is somewhat difficult to understand.  Her heart is regular without gallop.  Her chest shows no wheezes.  Abdomen is soft.  Assessment is that she is better, still problems with her arm. Hopefully, she will improve over the next several months at least and will plan to follow.     Latitia Housewright L. Juanetta Gosling, M.D.     ELH/MEDQ  D:  01/02/2011  T:  01/02/2011  Job:  [id number]

## 2011-01-05 ENCOUNTER — Ambulatory Visit (INDEPENDENT_AMBULATORY_CARE_PROVIDER_SITE_OTHER): Payer: Medicare Other | Admitting: Orthopedic Surgery

## 2011-01-05 ENCOUNTER — Encounter: Payer: Self-pay | Admitting: Orthopedic Surgery

## 2011-01-05 VITALS — Ht 63.0 in | Wt 147.0 lb

## 2011-01-05 DIAGNOSIS — S4290XA Fracture of unspecified shoulder girdle, part unspecified, initial encounter for closed fracture: Secondary | ICD-10-CM

## 2011-01-05 DIAGNOSIS — S42209A Unspecified fracture of upper end of unspecified humerus, initial encounter for closed fracture: Secondary | ICD-10-CM

## 2011-01-05 NOTE — Patient Instructions (Signed)
Sling n;o shoulder motion

## 2011-01-05 NOTE — Progress Notes (Signed)
Followup status post open treatment internal fixation with proximal humeral locking plate, RIGHT shoulder. This is the 6th postop week patient comes in for x-rays.  Currently at a skilled nursing facility undergoing hand, elbow, and wrist therapy. Sling for the shoulder. No movement at this time.  X-rays [facility] and lateral RIGHT shoulder separate x-ray report  followupInternal fixation to assess hardware and healing of  The fracture has maintained its alignment. The screws are in stable position. There is some radiolucency at the fracture site expected at 6 weeks.  Overall impression stable fixation, RIGHT shoulder.  Patient will followup 6 weeks for x-ray

## 2011-01-24 NOTE — Progress Notes (Signed)
NAME:  LEKHA, DANCER             ACCOUNT NO.:  [id number]  MEDICAL RECORD NO.:  [id number]  LOCATION:                                 FACILITY:  PHYSICIAN:  Briunna Leicht L. Juanetta Gosling, M.D.DATE OF BIRTH:  09-08-1920  DATE OF PROCEDURE: DATE OF DISCHARGE:                                PROGRESS NOTE   Ms. Basham is a resident of the [facility] and she is there because she has had a fracture in the humerus.  She has had a previous stroke. She has difficulty with ambulating even with a walker because of the stroke and the fracture.  Her speech is difficult to understand and has been for some time since the stroke.  PHYSICAL EXAMINATION:  GENERAL:  Shows that she is awake and alert.  She looks comfortable. CHEST:  Her chest is clear. HEART:  Regular. EXTREMITIES:  Her arm looks better and she had significant bruising on her leg and that is improved as well.  My assessment then is that she is better and my plan, no changes in her treatment.  She is going to continue working on her rehabilitation and follow from there.     Allyn Bartelson L. Juanetta Gosling, M.D.     ELH/MEDQ  D:  01/23/2011  T:  01/24/2011  Job:  [id number]

## 2011-02-16 ENCOUNTER — Ambulatory Visit (INDEPENDENT_AMBULATORY_CARE_PROVIDER_SITE_OTHER): Payer: Medicare Other | Admitting: Orthopedic Surgery

## 2011-02-16 ENCOUNTER — Encounter: Payer: Self-pay | Admitting: Orthopedic Surgery

## 2011-02-16 VITALS — Ht 63.0 in | Wt 147.0 lb

## 2011-02-16 DIAGNOSIS — S4290XA Fracture of unspecified shoulder girdle, part unspecified, initial encounter for closed fracture: Secondary | ICD-10-CM

## 2011-02-16 DIAGNOSIS — S42209A Unspecified fracture of upper end of unspecified humerus, initial encounter for closed fracture: Secondary | ICD-10-CM

## 2011-02-16 NOTE — Progress Notes (Signed)
Follow-up  Date of surgery September 5.  Treatment open treatment internal fixation, RIGHT shoulder, with bone graft and locking plate.  System review negative.  Complaints of pain.  Elbow, wrist, and hand functioning normally.  X-rays 2 views, RIGHT shoulder Separate report John for evaluation of fracture, status post internal fixation  The plate is seen fixating a proximal humerus fracture. There appears to be healing across the fracture with a gap on the far medial side. Alignment looks normal.  There is a screw, which has sought. Its own position, slightly backed out, but unchanged since last x-ray  Impression healing proximal humerus fracture with internal fixation and normal alignment  6 weeks. X-ray  Start physical therapy Platform walker  and weight-bear with platform walker

## 2011-02-25 ENCOUNTER — Telehealth: Payer: Self-pay | Admitting: Orthopedic Surgery

## 2011-02-25 NOTE — Telephone Encounter (Signed)
Carla Bonilla [facility] asking if they can d/c Carla Bonilla sling.  If not , can she at least take it off during meals? Need an order faxed to [facility] at [phone]

## 2011-02-26 NOTE — Telephone Encounter (Signed)
Order faxed.

## 2011-02-26 NOTE — Telephone Encounter (Signed)
REMOVE SLING 

## 2011-03-01 NOTE — Telephone Encounter (Signed)
[facility] faxed order

## 2011-03-17 NOTE — Progress Notes (Signed)
NAME:  MADDEN, PIAZZA             ACCOUNT NO.:  [id number]  MEDICAL RECORD NO.:  [id number]  LOCATION:                                 FACILITY:  PHYSICIAN:  Malaijah Houchen L. Juanetta Gosling, M.D.DATE OF BIRTH:  10-27-1920  DATE OF PROCEDURE: DATE OF DISCHARGE:                                PROGRESS NOTE   Ms. Fleer is at the [facility] after having had a hip fracture. She has also had a severe stroke which has left her with a severe speech impediment.  She has been doing better.  Her fracture is in the humerus and she seems to be improving in general.  She has no other new complaints.  She says that she is getting along fairly well.  PHYSICAL EXAMINATION:  She is awake and alert.  Her chest is clear.  Her heart is regular.  Her abdomen is soft.  Extremities showed no edema.  ASSESSMENT:  She is generally better.  PLAN:  For her to continue with her current medications.  No changes in her treatments today, and I will plan she is going to continue with PT, etc.  No other new medications at this point.     Ariyanah Aguado L. Juanetta Gosling, M.D.     ELH/MEDQ  D:  03/13/2011  T:  03/13/2011  Job:  [id number]

## 2011-03-23 DIAGNOSIS — R262 Difficulty in walking, not elsewhere classified: Secondary | ICD-10-CM | POA: Diagnosis not present

## 2011-03-23 DIAGNOSIS — R1311 Dysphagia, oral phase: Secondary | ICD-10-CM | POA: Diagnosis not present

## 2011-03-23 DIAGNOSIS — D72829 Elevated white blood cell count, unspecified: Secondary | ICD-10-CM | POA: Diagnosis not present

## 2011-03-23 DIAGNOSIS — H409 Unspecified glaucoma: Secondary | ICD-10-CM | POA: Diagnosis not present

## 2011-03-23 DIAGNOSIS — I10 Essential (primary) hypertension: Secondary | ICD-10-CM | POA: Diagnosis not present

## 2011-03-23 DIAGNOSIS — I6990 Unspecified sequelae of unspecified cerebrovascular disease: Secondary | ICD-10-CM | POA: Diagnosis not present

## 2011-03-23 DIAGNOSIS — S42409A Unspecified fracture of lower end of unspecified humerus, initial encounter for closed fracture: Secondary | ICD-10-CM | POA: Diagnosis not present

## 2011-03-23 DIAGNOSIS — M6281 Muscle weakness (generalized): Secondary | ICD-10-CM | POA: Diagnosis not present

## 2011-03-23 DIAGNOSIS — Z5189 Encounter for other specified aftercare: Secondary | ICD-10-CM | POA: Diagnosis not present

## 2011-03-26 ENCOUNTER — Telehealth: Payer: Self-pay | Admitting: Orthopedic Surgery

## 2011-03-26 DIAGNOSIS — I6990 Unspecified sequelae of unspecified cerebrovascular disease: Secondary | ICD-10-CM | POA: Diagnosis not present

## 2011-03-26 DIAGNOSIS — R262 Difficulty in walking, not elsewhere classified: Secondary | ICD-10-CM | POA: Diagnosis not present

## 2011-03-26 DIAGNOSIS — S42409A Unspecified fracture of lower end of unspecified humerus, initial encounter for closed fracture: Secondary | ICD-10-CM | POA: Diagnosis not present

## 2011-03-26 DIAGNOSIS — R482 Apraxia: Secondary | ICD-10-CM | POA: Diagnosis not present

## 2011-03-26 DIAGNOSIS — M6281 Muscle weakness (generalized): Secondary | ICD-10-CM | POA: Diagnosis not present

## 2011-03-26 NOTE — Telephone Encounter (Signed)
Delia Chimes, occupational therapist at [facility], h# [phone], called with question on sling, asking if it can be removed at least during meals for self-feeding.  Per Dr. Mort Sawyers orders on 02/26/11, "remove sling" -- I re-faxed orders directly to rehab department at [facility], [phone].

## 2011-03-27 DIAGNOSIS — S42293A Other displaced fracture of upper end of unspecified humerus, initial encounter for closed fracture: Secondary | ICD-10-CM | POA: Diagnosis not present

## 2011-03-29 DIAGNOSIS — R482 Apraxia: Secondary | ICD-10-CM | POA: Diagnosis not present

## 2011-03-29 DIAGNOSIS — S42409A Unspecified fracture of lower end of unspecified humerus, initial encounter for closed fracture: Secondary | ICD-10-CM | POA: Diagnosis not present

## 2011-03-29 DIAGNOSIS — R262 Difficulty in walking, not elsewhere classified: Secondary | ICD-10-CM | POA: Diagnosis not present

## 2011-03-29 DIAGNOSIS — M6281 Muscle weakness (generalized): Secondary | ICD-10-CM | POA: Diagnosis not present

## 2011-03-29 DIAGNOSIS — I6990 Unspecified sequelae of unspecified cerebrovascular disease: Secondary | ICD-10-CM | POA: Diagnosis not present

## 2011-03-29 NOTE — Progress Notes (Signed)
NAME:  CYNCERE, RUHE             ACCOUNT NO.:  [id number]  MEDICAL RECORD NO.:  [id number]  LOCATION:                                 FACILITY:  PHYSICIAN:  Toniann Dickerson L. Juanetta Gosling, M.D.DATE OF BIRTH:  [birth year]  DATE OF PROCEDURE: DATE OF DISCHARGE:                                PROGRESS NOTE   Ms. Ganesh is an 78 year old who is admitted to the [facility] after a fracture in her humerus.  She has had a previous stroke and has a lot of difficulty with speech.  She also has problems with her vision and has severe glaucoma.  She says she is feeling okay, but she has gotten a bronchitis.  PHYSICAL EXAMINATION:  LUNGS:  She does have some rhonchi on her chest examination. HEART:  Regular. ABDOMEN:  Soft. NEUROLOGIC:  Her speech if still very difficult to understand. EXTREMITIES:  Her arm looks much better.  ASSESSMENT:  She has had a stroke which has left her with significant speech abnormalities.  She has had a fracture of the humerus.  She has significant visual problems, but she is overall doing better.  PLAN:  For her to continue with her current treatments and medications. No changes.  Continue rehab.  She does seem to be slowly improving.     Laurann Mcmorris L. Juanetta Gosling, M.D.     ELH/MEDQ  D:  03/27/2011  T:  03/27/2011  Job:  [id number]

## 2011-03-30 ENCOUNTER — Ambulatory Visit (INDEPENDENT_AMBULATORY_CARE_PROVIDER_SITE_OTHER): Payer: Medicare Other | Admitting: Orthopedic Surgery

## 2011-03-30 ENCOUNTER — Encounter: Payer: Self-pay | Admitting: Orthopedic Surgery

## 2011-03-30 VITALS — BP 124/60 | Ht 63.0 in | Wt 147.0 lb

## 2011-03-30 DIAGNOSIS — M6281 Muscle weakness (generalized): Secondary | ICD-10-CM | POA: Diagnosis not present

## 2011-03-30 DIAGNOSIS — S42409A Unspecified fracture of lower end of unspecified humerus, initial encounter for closed fracture: Secondary | ICD-10-CM | POA: Diagnosis not present

## 2011-03-30 DIAGNOSIS — I6990 Unspecified sequelae of unspecified cerebrovascular disease: Secondary | ICD-10-CM | POA: Diagnosis not present

## 2011-03-30 DIAGNOSIS — R262 Difficulty in walking, not elsewhere classified: Secondary | ICD-10-CM | POA: Diagnosis not present

## 2011-03-30 DIAGNOSIS — R482 Apraxia: Secondary | ICD-10-CM | POA: Diagnosis not present

## 2011-03-30 DIAGNOSIS — S42209A Unspecified fracture of upper end of unspecified humerus, initial encounter for closed fracture: Secondary | ICD-10-CM | POA: Insufficient documentation

## 2011-03-30 NOTE — Progress Notes (Addendum)
Patient ID: Carla Bonilla, female   DOB: 18-May-1920, 78 y.o.   MRN: [id number] Chief Complaint  Patient presents with  . Follow-up     6 week recheck right shoulder, DOS 11/25/10, X RAY    Previous internal fixation RIGHT shoulder for proximal humerus fracture as stated  Review of systems no pain no numbness no tingling just stiffness in the RIGHT shoulder   X-rays today show fracture consolidation  Patient is allowed to progress to full weightbearing on the RIGHT shoulder with a platform walker rest therapy as tolerated  No followup visits have been scheduled  X-ray report  2 views RIGHT shoulder to include all hardware  Fracture  Consolidation of the fracture is noted.  One screw is slightly Sticking out from the plate that has not changed since Last x-ray  Impression healed proximal humerus fracture

## 2011-03-30 NOTE — Patient Instructions (Signed)
Do  activities as tolerated

## 2011-03-31 DIAGNOSIS — I6990 Unspecified sequelae of unspecified cerebrovascular disease: Secondary | ICD-10-CM | POA: Diagnosis not present

## 2011-03-31 DIAGNOSIS — M6281 Muscle weakness (generalized): Secondary | ICD-10-CM | POA: Diagnosis not present

## 2011-03-31 DIAGNOSIS — R262 Difficulty in walking, not elsewhere classified: Secondary | ICD-10-CM | POA: Diagnosis not present

## 2011-03-31 DIAGNOSIS — R482 Apraxia: Secondary | ICD-10-CM | POA: Diagnosis not present

## 2011-03-31 DIAGNOSIS — S42409A Unspecified fracture of lower end of unspecified humerus, initial encounter for closed fracture: Secondary | ICD-10-CM | POA: Diagnosis not present

## 2011-04-01 DIAGNOSIS — S42409A Unspecified fracture of lower end of unspecified humerus, initial encounter for closed fracture: Secondary | ICD-10-CM | POA: Diagnosis not present

## 2011-04-01 DIAGNOSIS — M6281 Muscle weakness (generalized): Secondary | ICD-10-CM | POA: Diagnosis not present

## 2011-04-01 DIAGNOSIS — R262 Difficulty in walking, not elsewhere classified: Secondary | ICD-10-CM | POA: Diagnosis not present

## 2011-04-01 DIAGNOSIS — I6990 Unspecified sequelae of unspecified cerebrovascular disease: Secondary | ICD-10-CM | POA: Diagnosis not present

## 2011-04-01 DIAGNOSIS — R482 Apraxia: Secondary | ICD-10-CM | POA: Diagnosis not present

## 2011-04-02 DIAGNOSIS — I6990 Unspecified sequelae of unspecified cerebrovascular disease: Secondary | ICD-10-CM | POA: Diagnosis not present

## 2011-04-02 DIAGNOSIS — M6281 Muscle weakness (generalized): Secondary | ICD-10-CM | POA: Diagnosis not present

## 2011-04-02 DIAGNOSIS — R262 Difficulty in walking, not elsewhere classified: Secondary | ICD-10-CM | POA: Diagnosis not present

## 2011-04-02 DIAGNOSIS — S42409A Unspecified fracture of lower end of unspecified humerus, initial encounter for closed fracture: Secondary | ICD-10-CM | POA: Diagnosis not present

## 2011-04-02 DIAGNOSIS — R482 Apraxia: Secondary | ICD-10-CM | POA: Diagnosis not present

## 2011-04-05 DIAGNOSIS — S42409A Unspecified fracture of lower end of unspecified humerus, initial encounter for closed fracture: Secondary | ICD-10-CM | POA: Diagnosis not present

## 2011-04-05 DIAGNOSIS — M6281 Muscle weakness (generalized): Secondary | ICD-10-CM | POA: Diagnosis not present

## 2011-04-05 DIAGNOSIS — I6990 Unspecified sequelae of unspecified cerebrovascular disease: Secondary | ICD-10-CM | POA: Diagnosis not present

## 2011-04-05 DIAGNOSIS — R482 Apraxia: Secondary | ICD-10-CM | POA: Diagnosis not present

## 2011-04-05 DIAGNOSIS — R262 Difficulty in walking, not elsewhere classified: Secondary | ICD-10-CM | POA: Diagnosis not present

## 2011-04-06 DIAGNOSIS — S42409A Unspecified fracture of lower end of unspecified humerus, initial encounter for closed fracture: Secondary | ICD-10-CM | POA: Diagnosis not present

## 2011-04-06 DIAGNOSIS — R482 Apraxia: Secondary | ICD-10-CM | POA: Diagnosis not present

## 2011-04-06 DIAGNOSIS — I6990 Unspecified sequelae of unspecified cerebrovascular disease: Secondary | ICD-10-CM | POA: Diagnosis not present

## 2011-04-06 DIAGNOSIS — M6281 Muscle weakness (generalized): Secondary | ICD-10-CM | POA: Diagnosis not present

## 2011-04-06 DIAGNOSIS — R262 Difficulty in walking, not elsewhere classified: Secondary | ICD-10-CM | POA: Diagnosis not present

## 2011-04-07 DIAGNOSIS — R482 Apraxia: Secondary | ICD-10-CM | POA: Diagnosis not present

## 2011-04-07 DIAGNOSIS — I6990 Unspecified sequelae of unspecified cerebrovascular disease: Secondary | ICD-10-CM | POA: Diagnosis not present

## 2011-04-07 DIAGNOSIS — M6281 Muscle weakness (generalized): Secondary | ICD-10-CM | POA: Diagnosis not present

## 2011-04-07 DIAGNOSIS — S42409A Unspecified fracture of lower end of unspecified humerus, initial encounter for closed fracture: Secondary | ICD-10-CM | POA: Diagnosis not present

## 2011-04-07 DIAGNOSIS — R262 Difficulty in walking, not elsewhere classified: Secondary | ICD-10-CM | POA: Diagnosis not present

## 2011-04-08 DIAGNOSIS — R482 Apraxia: Secondary | ICD-10-CM | POA: Diagnosis not present

## 2011-04-08 DIAGNOSIS — M6281 Muscle weakness (generalized): Secondary | ICD-10-CM | POA: Diagnosis not present

## 2011-04-08 DIAGNOSIS — I6990 Unspecified sequelae of unspecified cerebrovascular disease: Secondary | ICD-10-CM | POA: Diagnosis not present

## 2011-04-08 DIAGNOSIS — S42409A Unspecified fracture of lower end of unspecified humerus, initial encounter for closed fracture: Secondary | ICD-10-CM | POA: Diagnosis not present

## 2011-04-08 DIAGNOSIS — R262 Difficulty in walking, not elsewhere classified: Secondary | ICD-10-CM | POA: Diagnosis not present

## 2011-04-09 DIAGNOSIS — R262 Difficulty in walking, not elsewhere classified: Secondary | ICD-10-CM | POA: Diagnosis not present

## 2011-04-09 DIAGNOSIS — S42409A Unspecified fracture of lower end of unspecified humerus, initial encounter for closed fracture: Secondary | ICD-10-CM | POA: Diagnosis not present

## 2011-04-09 DIAGNOSIS — M6281 Muscle weakness (generalized): Secondary | ICD-10-CM | POA: Diagnosis not present

## 2011-04-09 DIAGNOSIS — I6990 Unspecified sequelae of unspecified cerebrovascular disease: Secondary | ICD-10-CM | POA: Diagnosis not present

## 2011-04-09 DIAGNOSIS — R482 Apraxia: Secondary | ICD-10-CM | POA: Diagnosis not present

## 2011-04-12 DIAGNOSIS — I6990 Unspecified sequelae of unspecified cerebrovascular disease: Secondary | ICD-10-CM | POA: Diagnosis not present

## 2011-04-12 DIAGNOSIS — S42409A Unspecified fracture of lower end of unspecified humerus, initial encounter for closed fracture: Secondary | ICD-10-CM | POA: Diagnosis not present

## 2011-04-12 DIAGNOSIS — R482 Apraxia: Secondary | ICD-10-CM | POA: Diagnosis not present

## 2011-04-12 DIAGNOSIS — M6281 Muscle weakness (generalized): Secondary | ICD-10-CM | POA: Diagnosis not present

## 2011-04-12 DIAGNOSIS — R262 Difficulty in walking, not elsewhere classified: Secondary | ICD-10-CM | POA: Diagnosis not present

## 2011-04-13 DIAGNOSIS — I6990 Unspecified sequelae of unspecified cerebrovascular disease: Secondary | ICD-10-CM | POA: Diagnosis not present

## 2011-04-13 DIAGNOSIS — S42409A Unspecified fracture of lower end of unspecified humerus, initial encounter for closed fracture: Secondary | ICD-10-CM | POA: Diagnosis not present

## 2011-04-13 DIAGNOSIS — R482 Apraxia: Secondary | ICD-10-CM | POA: Diagnosis not present

## 2011-04-13 DIAGNOSIS — M6281 Muscle weakness (generalized): Secondary | ICD-10-CM | POA: Diagnosis not present

## 2011-04-13 DIAGNOSIS — R262 Difficulty in walking, not elsewhere classified: Secondary | ICD-10-CM | POA: Diagnosis not present

## 2011-04-14 DIAGNOSIS — S42409A Unspecified fracture of lower end of unspecified humerus, initial encounter for closed fracture: Secondary | ICD-10-CM | POA: Diagnosis not present

## 2011-04-14 DIAGNOSIS — R482 Apraxia: Secondary | ICD-10-CM | POA: Diagnosis not present

## 2011-04-14 DIAGNOSIS — M6281 Muscle weakness (generalized): Secondary | ICD-10-CM | POA: Diagnosis not present

## 2011-04-14 DIAGNOSIS — I6990 Unspecified sequelae of unspecified cerebrovascular disease: Secondary | ICD-10-CM | POA: Diagnosis not present

## 2011-04-14 DIAGNOSIS — R262 Difficulty in walking, not elsewhere classified: Secondary | ICD-10-CM | POA: Diagnosis not present

## 2011-04-15 DIAGNOSIS — R482 Apraxia: Secondary | ICD-10-CM | POA: Diagnosis not present

## 2011-04-15 DIAGNOSIS — M6281 Muscle weakness (generalized): Secondary | ICD-10-CM | POA: Diagnosis not present

## 2011-04-15 DIAGNOSIS — R262 Difficulty in walking, not elsewhere classified: Secondary | ICD-10-CM | POA: Diagnosis not present

## 2011-04-15 DIAGNOSIS — I6990 Unspecified sequelae of unspecified cerebrovascular disease: Secondary | ICD-10-CM | POA: Diagnosis not present

## 2011-04-15 DIAGNOSIS — S42409A Unspecified fracture of lower end of unspecified humerus, initial encounter for closed fracture: Secondary | ICD-10-CM | POA: Diagnosis not present

## 2011-04-16 DIAGNOSIS — M6281 Muscle weakness (generalized): Secondary | ICD-10-CM | POA: Diagnosis not present

## 2011-04-16 DIAGNOSIS — S42409A Unspecified fracture of lower end of unspecified humerus, initial encounter for closed fracture: Secondary | ICD-10-CM | POA: Diagnosis not present

## 2011-04-16 DIAGNOSIS — R262 Difficulty in walking, not elsewhere classified: Secondary | ICD-10-CM | POA: Diagnosis not present

## 2011-04-16 DIAGNOSIS — I6990 Unspecified sequelae of unspecified cerebrovascular disease: Secondary | ICD-10-CM | POA: Diagnosis not present

## 2011-04-16 DIAGNOSIS — R482 Apraxia: Secondary | ICD-10-CM | POA: Diagnosis not present

## 2011-04-17 DIAGNOSIS — M6281 Muscle weakness (generalized): Secondary | ICD-10-CM | POA: Diagnosis not present

## 2011-04-17 DIAGNOSIS — I6990 Unspecified sequelae of unspecified cerebrovascular disease: Secondary | ICD-10-CM | POA: Diagnosis not present

## 2011-04-17 DIAGNOSIS — R262 Difficulty in walking, not elsewhere classified: Secondary | ICD-10-CM | POA: Diagnosis not present

## 2011-04-17 DIAGNOSIS — R482 Apraxia: Secondary | ICD-10-CM | POA: Diagnosis not present

## 2011-04-17 DIAGNOSIS — S42409A Unspecified fracture of lower end of unspecified humerus, initial encounter for closed fracture: Secondary | ICD-10-CM | POA: Diagnosis not present

## 2011-04-19 DIAGNOSIS — I6990 Unspecified sequelae of unspecified cerebrovascular disease: Secondary | ICD-10-CM | POA: Diagnosis not present

## 2011-04-19 DIAGNOSIS — R262 Difficulty in walking, not elsewhere classified: Secondary | ICD-10-CM | POA: Diagnosis not present

## 2011-04-19 DIAGNOSIS — S42409A Unspecified fracture of lower end of unspecified humerus, initial encounter for closed fracture: Secondary | ICD-10-CM | POA: Diagnosis not present

## 2011-04-19 DIAGNOSIS — R482 Apraxia: Secondary | ICD-10-CM | POA: Diagnosis not present

## 2011-04-19 DIAGNOSIS — M6281 Muscle weakness (generalized): Secondary | ICD-10-CM | POA: Diagnosis not present

## 2011-04-20 DIAGNOSIS — I6990 Unspecified sequelae of unspecified cerebrovascular disease: Secondary | ICD-10-CM | POA: Diagnosis not present

## 2011-04-20 DIAGNOSIS — R262 Difficulty in walking, not elsewhere classified: Secondary | ICD-10-CM | POA: Diagnosis not present

## 2011-04-20 DIAGNOSIS — M6281 Muscle weakness (generalized): Secondary | ICD-10-CM | POA: Diagnosis not present

## 2011-04-20 DIAGNOSIS — S42409A Unspecified fracture of lower end of unspecified humerus, initial encounter for closed fracture: Secondary | ICD-10-CM | POA: Diagnosis not present

## 2011-04-20 DIAGNOSIS — R482 Apraxia: Secondary | ICD-10-CM | POA: Diagnosis not present

## 2011-04-22 DIAGNOSIS — I6990 Unspecified sequelae of unspecified cerebrovascular disease: Secondary | ICD-10-CM | POA: Diagnosis not present

## 2011-04-22 DIAGNOSIS — M6281 Muscle weakness (generalized): Secondary | ICD-10-CM | POA: Diagnosis not present

## 2011-04-22 DIAGNOSIS — S42409A Unspecified fracture of lower end of unspecified humerus, initial encounter for closed fracture: Secondary | ICD-10-CM | POA: Diagnosis not present

## 2011-04-22 DIAGNOSIS — R482 Apraxia: Secondary | ICD-10-CM | POA: Diagnosis not present

## 2011-04-22 DIAGNOSIS — R262 Difficulty in walking, not elsewhere classified: Secondary | ICD-10-CM | POA: Diagnosis not present

## 2011-04-23 DIAGNOSIS — I6990 Unspecified sequelae of unspecified cerebrovascular disease: Secondary | ICD-10-CM | POA: Diagnosis not present

## 2011-04-23 DIAGNOSIS — M6281 Muscle weakness (generalized): Secondary | ICD-10-CM | POA: Diagnosis not present

## 2011-04-23 DIAGNOSIS — S42409A Unspecified fracture of lower end of unspecified humerus, initial encounter for closed fracture: Secondary | ICD-10-CM | POA: Diagnosis not present

## 2011-04-24 DIAGNOSIS — S42409A Unspecified fracture of lower end of unspecified humerus, initial encounter for closed fracture: Secondary | ICD-10-CM | POA: Diagnosis not present

## 2011-04-24 DIAGNOSIS — I6990 Unspecified sequelae of unspecified cerebrovascular disease: Secondary | ICD-10-CM | POA: Diagnosis not present

## 2011-04-24 DIAGNOSIS — M6281 Muscle weakness (generalized): Secondary | ICD-10-CM | POA: Diagnosis not present

## 2011-04-26 DIAGNOSIS — I6990 Unspecified sequelae of unspecified cerebrovascular disease: Secondary | ICD-10-CM | POA: Diagnosis not present

## 2011-04-26 DIAGNOSIS — M6281 Muscle weakness (generalized): Secondary | ICD-10-CM | POA: Diagnosis not present

## 2011-04-26 DIAGNOSIS — S42409A Unspecified fracture of lower end of unspecified humerus, initial encounter for closed fracture: Secondary | ICD-10-CM | POA: Diagnosis not present

## 2011-04-27 DIAGNOSIS — S42409A Unspecified fracture of lower end of unspecified humerus, initial encounter for closed fracture: Secondary | ICD-10-CM | POA: Diagnosis not present

## 2011-04-27 DIAGNOSIS — M6281 Muscle weakness (generalized): Secondary | ICD-10-CM | POA: Diagnosis not present

## 2011-04-27 DIAGNOSIS — I6990 Unspecified sequelae of unspecified cerebrovascular disease: Secondary | ICD-10-CM | POA: Diagnosis not present

## 2011-04-28 DIAGNOSIS — I6990 Unspecified sequelae of unspecified cerebrovascular disease: Secondary | ICD-10-CM | POA: Diagnosis not present

## 2011-04-28 DIAGNOSIS — S42409A Unspecified fracture of lower end of unspecified humerus, initial encounter for closed fracture: Secondary | ICD-10-CM | POA: Diagnosis not present

## 2011-04-28 DIAGNOSIS — M6281 Muscle weakness (generalized): Secondary | ICD-10-CM | POA: Diagnosis not present

## 2011-04-29 DIAGNOSIS — S42409A Unspecified fracture of lower end of unspecified humerus, initial encounter for closed fracture: Secondary | ICD-10-CM | POA: Diagnosis not present

## 2011-04-29 DIAGNOSIS — I6990 Unspecified sequelae of unspecified cerebrovascular disease: Secondary | ICD-10-CM | POA: Diagnosis not present

## 2011-04-29 DIAGNOSIS — M6281 Muscle weakness (generalized): Secondary | ICD-10-CM | POA: Diagnosis not present

## 2011-04-30 DIAGNOSIS — I6990 Unspecified sequelae of unspecified cerebrovascular disease: Secondary | ICD-10-CM | POA: Diagnosis not present

## 2011-04-30 DIAGNOSIS — M6281 Muscle weakness (generalized): Secondary | ICD-10-CM | POA: Diagnosis not present

## 2011-04-30 DIAGNOSIS — S42409A Unspecified fracture of lower end of unspecified humerus, initial encounter for closed fracture: Secondary | ICD-10-CM | POA: Diagnosis not present

## 2011-05-01 DIAGNOSIS — S42293A Other displaced fracture of upper end of unspecified humerus, initial encounter for closed fracture: Secondary | ICD-10-CM | POA: Diagnosis not present

## 2011-05-03 DIAGNOSIS — S42409A Unspecified fracture of lower end of unspecified humerus, initial encounter for closed fracture: Secondary | ICD-10-CM | POA: Diagnosis not present

## 2011-05-03 DIAGNOSIS — I6990 Unspecified sequelae of unspecified cerebrovascular disease: Secondary | ICD-10-CM | POA: Diagnosis not present

## 2011-05-03 DIAGNOSIS — M6281 Muscle weakness (generalized): Secondary | ICD-10-CM | POA: Diagnosis not present

## 2011-05-03 NOTE — Progress Notes (Signed)
NAME:  Carla, Bonilla             ACCOUNT NO.:  [id number]  MEDICAL RECORD NO.:  [id number]  LOCATION:                                 FACILITY:  PHYSICIAN:  Yamilee Harmes L. Juanetta Gosling, M.D.DATE OF BIRTH:  February 10, 1921  DATE OF PROCEDURE: DATE OF DISCHARGE:                                PROGRESS NOTE   Carla Bonilla is better.  She still has problems with her speech.  She is able to raise her right arm, almost level at 290 degree angle.  Her legs looked much better.  She has no new complaints.  PHYSICAL EXAMINATION:  CHEST:  Clear. HEART:  Regular. ABDOMEN:  Soft.  ASSESSMENT:  She has had right humeral fracture.  She then had cellulitis of her legs.  She has had a severe stroke which is left her with speech that is difficult to understand and that is unchanged.  My plan then is to continue with her current treatments and medications. No changes today.     Emiley Digiacomo L. Juanetta Gosling, M.D.     ELH/MEDQ  D:  05/02/2011  T:  05/02/2011  Job:  [id number]

## 2011-05-04 DIAGNOSIS — M6281 Muscle weakness (generalized): Secondary | ICD-10-CM | POA: Diagnosis not present

## 2011-05-04 DIAGNOSIS — I6990 Unspecified sequelae of unspecified cerebrovascular disease: Secondary | ICD-10-CM | POA: Diagnosis not present

## 2011-05-04 DIAGNOSIS — S42409A Unspecified fracture of lower end of unspecified humerus, initial encounter for closed fracture: Secondary | ICD-10-CM | POA: Diagnosis not present

## 2011-05-05 DIAGNOSIS — I739 Peripheral vascular disease, unspecified: Secondary | ICD-10-CM | POA: Diagnosis not present

## 2011-05-06 DIAGNOSIS — I6990 Unspecified sequelae of unspecified cerebrovascular disease: Secondary | ICD-10-CM | POA: Diagnosis not present

## 2011-05-06 DIAGNOSIS — M6281 Muscle weakness (generalized): Secondary | ICD-10-CM | POA: Diagnosis not present

## 2011-05-06 DIAGNOSIS — S42409A Unspecified fracture of lower end of unspecified humerus, initial encounter for closed fracture: Secondary | ICD-10-CM | POA: Diagnosis not present

## 2011-05-07 DIAGNOSIS — M6281 Muscle weakness (generalized): Secondary | ICD-10-CM | POA: Diagnosis not present

## 2011-05-07 DIAGNOSIS — S42409A Unspecified fracture of lower end of unspecified humerus, initial encounter for closed fracture: Secondary | ICD-10-CM | POA: Diagnosis not present

## 2011-05-07 DIAGNOSIS — I6990 Unspecified sequelae of unspecified cerebrovascular disease: Secondary | ICD-10-CM | POA: Diagnosis not present

## 2011-05-13 DIAGNOSIS — Z7901 Long term (current) use of anticoagulants: Secondary | ICD-10-CM | POA: Diagnosis not present

## 2011-05-13 DIAGNOSIS — D649 Anemia, unspecified: Secondary | ICD-10-CM | POA: Diagnosis not present

## 2011-05-23 DIAGNOSIS — M6281 Muscle weakness (generalized): Secondary | ICD-10-CM | POA: Diagnosis not present

## 2011-05-23 DIAGNOSIS — R269 Unspecified abnormalities of gait and mobility: Secondary | ICD-10-CM | POA: Diagnosis not present

## 2011-05-23 DIAGNOSIS — I10 Essential (primary) hypertension: Secondary | ICD-10-CM | POA: Diagnosis not present

## 2011-05-23 DIAGNOSIS — R262 Difficulty in walking, not elsewhere classified: Secondary | ICD-10-CM | POA: Diagnosis not present

## 2011-05-23 DIAGNOSIS — H409 Unspecified glaucoma: Secondary | ICD-10-CM | POA: Diagnosis not present

## 2011-05-23 DIAGNOSIS — I69922 Dysarthria following unspecified cerebrovascular disease: Secondary | ICD-10-CM | POA: Diagnosis not present

## 2011-05-23 DIAGNOSIS — S42309D Unspecified fracture of shaft of humerus, unspecified arm, subsequent encounter for fracture with routine healing: Secondary | ICD-10-CM | POA: Diagnosis not present

## 2011-05-27 DIAGNOSIS — M6281 Muscle weakness (generalized): Secondary | ICD-10-CM | POA: Diagnosis not present

## 2011-05-27 DIAGNOSIS — I10 Essential (primary) hypertension: Secondary | ICD-10-CM | POA: Diagnosis not present

## 2011-05-27 DIAGNOSIS — R262 Difficulty in walking, not elsewhere classified: Secondary | ICD-10-CM | POA: Diagnosis not present

## 2011-05-27 DIAGNOSIS — I69922 Dysarthria following unspecified cerebrovascular disease: Secondary | ICD-10-CM | POA: Diagnosis not present

## 2011-05-27 DIAGNOSIS — H409 Unspecified glaucoma: Secondary | ICD-10-CM | POA: Diagnosis not present

## 2011-05-27 DIAGNOSIS — S42309D Unspecified fracture of shaft of humerus, unspecified arm, subsequent encounter for fracture with routine healing: Secondary | ICD-10-CM | POA: Diagnosis not present

## 2011-05-31 DIAGNOSIS — R262 Difficulty in walking, not elsewhere classified: Secondary | ICD-10-CM | POA: Diagnosis not present

## 2011-05-31 DIAGNOSIS — H409 Unspecified glaucoma: Secondary | ICD-10-CM | POA: Diagnosis not present

## 2011-05-31 DIAGNOSIS — S42309D Unspecified fracture of shaft of humerus, unspecified arm, subsequent encounter for fracture with routine healing: Secondary | ICD-10-CM | POA: Diagnosis not present

## 2011-05-31 DIAGNOSIS — M6281 Muscle weakness (generalized): Secondary | ICD-10-CM | POA: Diagnosis not present

## 2011-05-31 DIAGNOSIS — I69922 Dysarthria following unspecified cerebrovascular disease: Secondary | ICD-10-CM | POA: Diagnosis not present

## 2011-05-31 DIAGNOSIS — I10 Essential (primary) hypertension: Secondary | ICD-10-CM | POA: Diagnosis not present

## 2011-06-01 DIAGNOSIS — R262 Difficulty in walking, not elsewhere classified: Secondary | ICD-10-CM | POA: Diagnosis not present

## 2011-06-01 DIAGNOSIS — M6281 Muscle weakness (generalized): Secondary | ICD-10-CM | POA: Diagnosis not present

## 2011-06-01 DIAGNOSIS — I10 Essential (primary) hypertension: Secondary | ICD-10-CM | POA: Diagnosis not present

## 2011-06-01 DIAGNOSIS — H409 Unspecified glaucoma: Secondary | ICD-10-CM | POA: Diagnosis not present

## 2011-06-01 DIAGNOSIS — S42309D Unspecified fracture of shaft of humerus, unspecified arm, subsequent encounter for fracture with routine healing: Secondary | ICD-10-CM | POA: Diagnosis not present

## 2011-06-01 DIAGNOSIS — I69922 Dysarthria following unspecified cerebrovascular disease: Secondary | ICD-10-CM | POA: Diagnosis not present

## 2011-06-03 DIAGNOSIS — H409 Unspecified glaucoma: Secondary | ICD-10-CM | POA: Diagnosis not present

## 2011-06-03 DIAGNOSIS — S42309D Unspecified fracture of shaft of humerus, unspecified arm, subsequent encounter for fracture with routine healing: Secondary | ICD-10-CM | POA: Diagnosis not present

## 2011-06-03 DIAGNOSIS — I10 Essential (primary) hypertension: Secondary | ICD-10-CM | POA: Diagnosis not present

## 2011-06-03 DIAGNOSIS — R262 Difficulty in walking, not elsewhere classified: Secondary | ICD-10-CM | POA: Diagnosis not present

## 2011-06-03 DIAGNOSIS — I69922 Dysarthria following unspecified cerebrovascular disease: Secondary | ICD-10-CM | POA: Diagnosis not present

## 2011-06-03 DIAGNOSIS — M6281 Muscle weakness (generalized): Secondary | ICD-10-CM | POA: Diagnosis not present

## 2011-06-08 DIAGNOSIS — I10 Essential (primary) hypertension: Secondary | ICD-10-CM | POA: Diagnosis not present

## 2011-06-08 DIAGNOSIS — H409 Unspecified glaucoma: Secondary | ICD-10-CM | POA: Diagnosis not present

## 2011-06-08 DIAGNOSIS — M6281 Muscle weakness (generalized): Secondary | ICD-10-CM | POA: Diagnosis not present

## 2011-06-08 DIAGNOSIS — S42309D Unspecified fracture of shaft of humerus, unspecified arm, subsequent encounter for fracture with routine healing: Secondary | ICD-10-CM | POA: Diagnosis not present

## 2011-06-08 DIAGNOSIS — R262 Difficulty in walking, not elsewhere classified: Secondary | ICD-10-CM | POA: Diagnosis not present

## 2011-06-08 DIAGNOSIS — I69922 Dysarthria following unspecified cerebrovascular disease: Secondary | ICD-10-CM | POA: Diagnosis not present

## 2011-06-09 DIAGNOSIS — I10 Essential (primary) hypertension: Secondary | ICD-10-CM | POA: Diagnosis not present

## 2011-06-09 DIAGNOSIS — S42309D Unspecified fracture of shaft of humerus, unspecified arm, subsequent encounter for fracture with routine healing: Secondary | ICD-10-CM | POA: Diagnosis not present

## 2011-06-09 DIAGNOSIS — M6281 Muscle weakness (generalized): Secondary | ICD-10-CM | POA: Diagnosis not present

## 2011-06-09 DIAGNOSIS — H409 Unspecified glaucoma: Secondary | ICD-10-CM | POA: Diagnosis not present

## 2011-06-09 DIAGNOSIS — I69922 Dysarthria following unspecified cerebrovascular disease: Secondary | ICD-10-CM | POA: Diagnosis not present

## 2011-06-09 DIAGNOSIS — R262 Difficulty in walking, not elsewhere classified: Secondary | ICD-10-CM | POA: Diagnosis not present

## 2011-06-10 DIAGNOSIS — S42309D Unspecified fracture of shaft of humerus, unspecified arm, subsequent encounter for fracture with routine healing: Secondary | ICD-10-CM | POA: Diagnosis not present

## 2011-06-10 DIAGNOSIS — I10 Essential (primary) hypertension: Secondary | ICD-10-CM | POA: Diagnosis not present

## 2011-06-10 DIAGNOSIS — R262 Difficulty in walking, not elsewhere classified: Secondary | ICD-10-CM | POA: Diagnosis not present

## 2011-06-10 DIAGNOSIS — H409 Unspecified glaucoma: Secondary | ICD-10-CM | POA: Diagnosis not present

## 2011-06-10 DIAGNOSIS — I69922 Dysarthria following unspecified cerebrovascular disease: Secondary | ICD-10-CM | POA: Diagnosis not present

## 2011-06-10 DIAGNOSIS — M6281 Muscle weakness (generalized): Secondary | ICD-10-CM | POA: Diagnosis not present

## 2011-06-11 DIAGNOSIS — H409 Unspecified glaucoma: Secondary | ICD-10-CM | POA: Diagnosis not present

## 2011-06-11 DIAGNOSIS — R262 Difficulty in walking, not elsewhere classified: Secondary | ICD-10-CM | POA: Diagnosis not present

## 2011-06-11 DIAGNOSIS — M6281 Muscle weakness (generalized): Secondary | ICD-10-CM | POA: Diagnosis not present

## 2011-06-11 DIAGNOSIS — I10 Essential (primary) hypertension: Secondary | ICD-10-CM | POA: Diagnosis not present

## 2011-06-11 DIAGNOSIS — I69922 Dysarthria following unspecified cerebrovascular disease: Secondary | ICD-10-CM | POA: Diagnosis not present

## 2011-06-11 DIAGNOSIS — S42309D Unspecified fracture of shaft of humerus, unspecified arm, subsequent encounter for fracture with routine healing: Secondary | ICD-10-CM | POA: Diagnosis not present

## 2011-06-14 DIAGNOSIS — H409 Unspecified glaucoma: Secondary | ICD-10-CM | POA: Diagnosis not present

## 2011-06-14 DIAGNOSIS — M6281 Muscle weakness (generalized): Secondary | ICD-10-CM | POA: Diagnosis not present

## 2011-06-14 DIAGNOSIS — I6789 Other cerebrovascular disease: Secondary | ICD-10-CM | POA: Diagnosis not present

## 2011-06-14 DIAGNOSIS — S42309D Unspecified fracture of shaft of humerus, unspecified arm, subsequent encounter for fracture with routine healing: Secondary | ICD-10-CM | POA: Diagnosis not present

## 2011-06-14 DIAGNOSIS — I10 Essential (primary) hypertension: Secondary | ICD-10-CM | POA: Diagnosis not present

## 2011-06-14 DIAGNOSIS — I69922 Dysarthria following unspecified cerebrovascular disease: Secondary | ICD-10-CM | POA: Diagnosis not present

## 2011-06-14 DIAGNOSIS — S42293A Other displaced fracture of upper end of unspecified humerus, initial encounter for closed fracture: Secondary | ICD-10-CM | POA: Diagnosis not present

## 2011-06-14 DIAGNOSIS — E785 Hyperlipidemia, unspecified: Secondary | ICD-10-CM | POA: Diagnosis not present

## 2011-06-14 DIAGNOSIS — R262 Difficulty in walking, not elsewhere classified: Secondary | ICD-10-CM | POA: Diagnosis not present

## 2011-06-16 DIAGNOSIS — I69922 Dysarthria following unspecified cerebrovascular disease: Secondary | ICD-10-CM | POA: Diagnosis not present

## 2011-06-16 DIAGNOSIS — I10 Essential (primary) hypertension: Secondary | ICD-10-CM | POA: Diagnosis not present

## 2011-06-16 DIAGNOSIS — M6281 Muscle weakness (generalized): Secondary | ICD-10-CM | POA: Diagnosis not present

## 2011-06-16 DIAGNOSIS — S42309D Unspecified fracture of shaft of humerus, unspecified arm, subsequent encounter for fracture with routine healing: Secondary | ICD-10-CM | POA: Diagnosis not present

## 2011-06-16 DIAGNOSIS — H409 Unspecified glaucoma: Secondary | ICD-10-CM | POA: Diagnosis not present

## 2011-06-16 DIAGNOSIS — R262 Difficulty in walking, not elsewhere classified: Secondary | ICD-10-CM | POA: Diagnosis not present

## 2011-06-17 DIAGNOSIS — M6281 Muscle weakness (generalized): Secondary | ICD-10-CM | POA: Diagnosis not present

## 2011-06-17 DIAGNOSIS — S42309D Unspecified fracture of shaft of humerus, unspecified arm, subsequent encounter for fracture with routine healing: Secondary | ICD-10-CM | POA: Diagnosis not present

## 2011-06-17 DIAGNOSIS — H409 Unspecified glaucoma: Secondary | ICD-10-CM | POA: Diagnosis not present

## 2011-06-17 DIAGNOSIS — I10 Essential (primary) hypertension: Secondary | ICD-10-CM | POA: Diagnosis not present

## 2011-06-17 DIAGNOSIS — I69922 Dysarthria following unspecified cerebrovascular disease: Secondary | ICD-10-CM | POA: Diagnosis not present

## 2011-06-17 DIAGNOSIS — R262 Difficulty in walking, not elsewhere classified: Secondary | ICD-10-CM | POA: Diagnosis not present

## 2011-06-18 DIAGNOSIS — M6281 Muscle weakness (generalized): Secondary | ICD-10-CM | POA: Diagnosis not present

## 2011-06-18 DIAGNOSIS — R262 Difficulty in walking, not elsewhere classified: Secondary | ICD-10-CM | POA: Diagnosis not present

## 2011-06-18 DIAGNOSIS — I10 Essential (primary) hypertension: Secondary | ICD-10-CM | POA: Diagnosis not present

## 2011-06-18 DIAGNOSIS — I69922 Dysarthria following unspecified cerebrovascular disease: Secondary | ICD-10-CM | POA: Diagnosis not present

## 2011-06-18 DIAGNOSIS — H409 Unspecified glaucoma: Secondary | ICD-10-CM | POA: Diagnosis not present

## 2011-06-18 DIAGNOSIS — S42309D Unspecified fracture of shaft of humerus, unspecified arm, subsequent encounter for fracture with routine healing: Secondary | ICD-10-CM | POA: Diagnosis not present

## 2011-06-21 DIAGNOSIS — H1820 Unspecified corneal edema: Secondary | ICD-10-CM | POA: Diagnosis not present

## 2011-06-21 DIAGNOSIS — S058X9A Other injuries of unspecified eye and orbit, initial encounter: Secondary | ICD-10-CM | POA: Diagnosis not present

## 2011-06-22 DIAGNOSIS — R262 Difficulty in walking, not elsewhere classified: Secondary | ICD-10-CM | POA: Diagnosis not present

## 2011-06-22 DIAGNOSIS — H409 Unspecified glaucoma: Secondary | ICD-10-CM | POA: Diagnosis not present

## 2011-06-22 DIAGNOSIS — M6281 Muscle weakness (generalized): Secondary | ICD-10-CM | POA: Diagnosis not present

## 2011-06-22 DIAGNOSIS — S42309D Unspecified fracture of shaft of humerus, unspecified arm, subsequent encounter for fracture with routine healing: Secondary | ICD-10-CM | POA: Diagnosis not present

## 2011-06-22 DIAGNOSIS — I69922 Dysarthria following unspecified cerebrovascular disease: Secondary | ICD-10-CM | POA: Diagnosis not present

## 2011-06-22 DIAGNOSIS — I10 Essential (primary) hypertension: Secondary | ICD-10-CM | POA: Diagnosis not present

## 2011-06-23 DIAGNOSIS — H409 Unspecified glaucoma: Secondary | ICD-10-CM | POA: Diagnosis not present

## 2011-06-23 DIAGNOSIS — I10 Essential (primary) hypertension: Secondary | ICD-10-CM | POA: Diagnosis not present

## 2011-06-23 DIAGNOSIS — I69922 Dysarthria following unspecified cerebrovascular disease: Secondary | ICD-10-CM | POA: Diagnosis not present

## 2011-06-23 DIAGNOSIS — S42309D Unspecified fracture of shaft of humerus, unspecified arm, subsequent encounter for fracture with routine healing: Secondary | ICD-10-CM | POA: Diagnosis not present

## 2011-06-23 DIAGNOSIS — R262 Difficulty in walking, not elsewhere classified: Secondary | ICD-10-CM | POA: Diagnosis not present

## 2011-06-23 DIAGNOSIS — M6281 Muscle weakness (generalized): Secondary | ICD-10-CM | POA: Diagnosis not present

## 2011-06-24 DIAGNOSIS — M6281 Muscle weakness (generalized): Secondary | ICD-10-CM | POA: Diagnosis not present

## 2011-06-24 DIAGNOSIS — I10 Essential (primary) hypertension: Secondary | ICD-10-CM | POA: Diagnosis not present

## 2011-06-24 DIAGNOSIS — H409 Unspecified glaucoma: Secondary | ICD-10-CM | POA: Diagnosis not present

## 2011-06-24 DIAGNOSIS — R262 Difficulty in walking, not elsewhere classified: Secondary | ICD-10-CM | POA: Diagnosis not present

## 2011-06-24 DIAGNOSIS — H1820 Unspecified corneal edema: Secondary | ICD-10-CM | POA: Diagnosis not present

## 2011-06-24 DIAGNOSIS — I69922 Dysarthria following unspecified cerebrovascular disease: Secondary | ICD-10-CM | POA: Diagnosis not present

## 2011-06-24 DIAGNOSIS — S058X9A Other injuries of unspecified eye and orbit, initial encounter: Secondary | ICD-10-CM | POA: Diagnosis not present

## 2011-06-24 DIAGNOSIS — S42309D Unspecified fracture of shaft of humerus, unspecified arm, subsequent encounter for fracture with routine healing: Secondary | ICD-10-CM | POA: Diagnosis not present

## 2011-06-25 DIAGNOSIS — S42309D Unspecified fracture of shaft of humerus, unspecified arm, subsequent encounter for fracture with routine healing: Secondary | ICD-10-CM | POA: Diagnosis not present

## 2011-06-25 DIAGNOSIS — I69922 Dysarthria following unspecified cerebrovascular disease: Secondary | ICD-10-CM | POA: Diagnosis not present

## 2011-06-25 DIAGNOSIS — I10 Essential (primary) hypertension: Secondary | ICD-10-CM | POA: Diagnosis not present

## 2011-06-25 DIAGNOSIS — R262 Difficulty in walking, not elsewhere classified: Secondary | ICD-10-CM | POA: Diagnosis not present

## 2011-06-25 DIAGNOSIS — H409 Unspecified glaucoma: Secondary | ICD-10-CM | POA: Diagnosis not present

## 2011-06-25 DIAGNOSIS — M6281 Muscle weakness (generalized): Secondary | ICD-10-CM | POA: Diagnosis not present

## 2011-06-29 DIAGNOSIS — R262 Difficulty in walking, not elsewhere classified: Secondary | ICD-10-CM | POA: Diagnosis not present

## 2011-06-29 DIAGNOSIS — M6281 Muscle weakness (generalized): Secondary | ICD-10-CM | POA: Diagnosis not present

## 2011-06-29 DIAGNOSIS — H409 Unspecified glaucoma: Secondary | ICD-10-CM | POA: Diagnosis not present

## 2011-06-29 DIAGNOSIS — I69922 Dysarthria following unspecified cerebrovascular disease: Secondary | ICD-10-CM | POA: Diagnosis not present

## 2011-06-29 DIAGNOSIS — S42309D Unspecified fracture of shaft of humerus, unspecified arm, subsequent encounter for fracture with routine healing: Secondary | ICD-10-CM | POA: Diagnosis not present

## 2011-06-29 DIAGNOSIS — I10 Essential (primary) hypertension: Secondary | ICD-10-CM | POA: Diagnosis not present

## 2011-06-30 DIAGNOSIS — H409 Unspecified glaucoma: Secondary | ICD-10-CM | POA: Diagnosis not present

## 2011-06-30 DIAGNOSIS — I10 Essential (primary) hypertension: Secondary | ICD-10-CM | POA: Diagnosis not present

## 2011-06-30 DIAGNOSIS — S42309D Unspecified fracture of shaft of humerus, unspecified arm, subsequent encounter for fracture with routine healing: Secondary | ICD-10-CM | POA: Diagnosis not present

## 2011-06-30 DIAGNOSIS — M6281 Muscle weakness (generalized): Secondary | ICD-10-CM | POA: Diagnosis not present

## 2011-06-30 DIAGNOSIS — R262 Difficulty in walking, not elsewhere classified: Secondary | ICD-10-CM | POA: Diagnosis not present

## 2011-06-30 DIAGNOSIS — I69922 Dysarthria following unspecified cerebrovascular disease: Secondary | ICD-10-CM | POA: Diagnosis not present

## 2011-07-01 DIAGNOSIS — S42309D Unspecified fracture of shaft of humerus, unspecified arm, subsequent encounter for fracture with routine healing: Secondary | ICD-10-CM | POA: Diagnosis not present

## 2011-07-01 DIAGNOSIS — H409 Unspecified glaucoma: Secondary | ICD-10-CM | POA: Diagnosis not present

## 2011-07-01 DIAGNOSIS — M6281 Muscle weakness (generalized): Secondary | ICD-10-CM | POA: Diagnosis not present

## 2011-07-01 DIAGNOSIS — I10 Essential (primary) hypertension: Secondary | ICD-10-CM | POA: Diagnosis not present

## 2011-07-01 DIAGNOSIS — R262 Difficulty in walking, not elsewhere classified: Secondary | ICD-10-CM | POA: Diagnosis not present

## 2011-07-01 DIAGNOSIS — I69922 Dysarthria following unspecified cerebrovascular disease: Secondary | ICD-10-CM | POA: Diagnosis not present

## 2011-07-02 DIAGNOSIS — I69922 Dysarthria following unspecified cerebrovascular disease: Secondary | ICD-10-CM | POA: Diagnosis not present

## 2011-07-02 DIAGNOSIS — R262 Difficulty in walking, not elsewhere classified: Secondary | ICD-10-CM | POA: Diagnosis not present

## 2011-07-02 DIAGNOSIS — S42309D Unspecified fracture of shaft of humerus, unspecified arm, subsequent encounter for fracture with routine healing: Secondary | ICD-10-CM | POA: Diagnosis not present

## 2011-07-02 DIAGNOSIS — M6281 Muscle weakness (generalized): Secondary | ICD-10-CM | POA: Diagnosis not present

## 2011-07-02 DIAGNOSIS — I10 Essential (primary) hypertension: Secondary | ICD-10-CM | POA: Diagnosis not present

## 2011-07-02 DIAGNOSIS — H409 Unspecified glaucoma: Secondary | ICD-10-CM | POA: Diagnosis not present

## 2011-07-08 DIAGNOSIS — S058X9A Other injuries of unspecified eye and orbit, initial encounter: Secondary | ICD-10-CM | POA: Diagnosis not present

## 2011-07-09 DIAGNOSIS — I10 Essential (primary) hypertension: Secondary | ICD-10-CM | POA: Diagnosis not present

## 2011-07-09 DIAGNOSIS — H409 Unspecified glaucoma: Secondary | ICD-10-CM | POA: Diagnosis not present

## 2011-07-09 DIAGNOSIS — S42309D Unspecified fracture of shaft of humerus, unspecified arm, subsequent encounter for fracture with routine healing: Secondary | ICD-10-CM | POA: Diagnosis not present

## 2011-07-09 DIAGNOSIS — R262 Difficulty in walking, not elsewhere classified: Secondary | ICD-10-CM | POA: Diagnosis not present

## 2011-07-09 DIAGNOSIS — M6281 Muscle weakness (generalized): Secondary | ICD-10-CM | POA: Diagnosis not present

## 2011-07-09 DIAGNOSIS — I69922 Dysarthria following unspecified cerebrovascular disease: Secondary | ICD-10-CM | POA: Diagnosis not present

## 2011-07-19 DIAGNOSIS — I10 Essential (primary) hypertension: Secondary | ICD-10-CM | POA: Diagnosis not present

## 2011-07-19 DIAGNOSIS — I69922 Dysarthria following unspecified cerebrovascular disease: Secondary | ICD-10-CM | POA: Diagnosis not present

## 2011-07-19 DIAGNOSIS — H409 Unspecified glaucoma: Secondary | ICD-10-CM | POA: Diagnosis not present

## 2011-07-19 DIAGNOSIS — R262 Difficulty in walking, not elsewhere classified: Secondary | ICD-10-CM | POA: Diagnosis not present

## 2011-07-19 DIAGNOSIS — S42309D Unspecified fracture of shaft of humerus, unspecified arm, subsequent encounter for fracture with routine healing: Secondary | ICD-10-CM | POA: Diagnosis not present

## 2011-07-19 DIAGNOSIS — M6281 Muscle weakness (generalized): Secondary | ICD-10-CM | POA: Diagnosis not present

## 2011-07-21 DIAGNOSIS — I69922 Dysarthria following unspecified cerebrovascular disease: Secondary | ICD-10-CM | POA: Diagnosis not present

## 2011-07-21 DIAGNOSIS — H409 Unspecified glaucoma: Secondary | ICD-10-CM | POA: Diagnosis not present

## 2011-07-21 DIAGNOSIS — M6281 Muscle weakness (generalized): Secondary | ICD-10-CM | POA: Diagnosis not present

## 2011-07-21 DIAGNOSIS — S42309D Unspecified fracture of shaft of humerus, unspecified arm, subsequent encounter for fracture with routine healing: Secondary | ICD-10-CM | POA: Diagnosis not present

## 2011-07-21 DIAGNOSIS — R262 Difficulty in walking, not elsewhere classified: Secondary | ICD-10-CM | POA: Diagnosis not present

## 2011-07-21 DIAGNOSIS — I10 Essential (primary) hypertension: Secondary | ICD-10-CM | POA: Diagnosis not present

## 2011-09-21 DIAGNOSIS — E785 Hyperlipidemia, unspecified: Secondary | ICD-10-CM | POA: Diagnosis not present

## 2011-09-21 DIAGNOSIS — I6789 Other cerebrovascular disease: Secondary | ICD-10-CM | POA: Diagnosis not present

## 2011-09-21 DIAGNOSIS — I10 Essential (primary) hypertension: Secondary | ICD-10-CM | POA: Diagnosis not present

## 2011-09-21 DIAGNOSIS — H409 Unspecified glaucoma: Secondary | ICD-10-CM | POA: Diagnosis not present

## 2011-09-21 DIAGNOSIS — N390 Urinary tract infection, site not specified: Secondary | ICD-10-CM | POA: Diagnosis not present

## 2011-09-21 DIAGNOSIS — Z23 Encounter for immunization: Secondary | ICD-10-CM | POA: Diagnosis not present

## 2011-11-03 DIAGNOSIS — H409 Unspecified glaucoma: Secondary | ICD-10-CM | POA: Diagnosis not present

## 2011-11-03 DIAGNOSIS — H4011X0 Primary open-angle glaucoma, stage unspecified: Secondary | ICD-10-CM | POA: Diagnosis not present

## 2011-12-28 ENCOUNTER — Emergency Department (HOSPITAL_COMMUNITY)
Admission: EM | Admit: 2011-12-28 | Discharge: 2011-12-28 | Disposition: A | Discharge status: Home / Self Care | Payer: Medicare Other | Attending: Emergency Medicine | Admitting: Emergency Medicine

## 2011-12-28 ENCOUNTER — Emergency Department (HOSPITAL_COMMUNITY): Payer: Medicare Other

## 2011-12-28 ENCOUNTER — Encounter (HOSPITAL_COMMUNITY): Payer: Self-pay | Admitting: Emergency Medicine

## 2011-12-28 DIAGNOSIS — H5440 Blindness, one eye, unspecified eye: Secondary | ICD-10-CM | POA: Diagnosis not present

## 2011-12-28 DIAGNOSIS — M25552 Pain in left hip: Secondary | ICD-10-CM

## 2011-12-28 DIAGNOSIS — Z8673 Personal history of transient ischemic attack (TIA), and cerebral infarction without residual deficits: Secondary | ICD-10-CM | POA: Diagnosis not present

## 2011-12-28 DIAGNOSIS — M169 Osteoarthritis of hip, unspecified: Secondary | ICD-10-CM | POA: Diagnosis not present

## 2011-12-28 DIAGNOSIS — M47817 Spondylosis without myelopathy or radiculopathy, lumbosacral region: Secondary | ICD-10-CM | POA: Diagnosis not present

## 2011-12-28 DIAGNOSIS — M25559 Pain in unspecified hip: Secondary | ICD-10-CM | POA: Insufficient documentation

## 2011-12-28 DIAGNOSIS — I10 Essential (primary) hypertension: Secondary | ICD-10-CM | POA: Insufficient documentation

## 2011-12-28 DIAGNOSIS — M5137 Other intervertebral disc degeneration, lumbosacral region: Secondary | ICD-10-CM | POA: Diagnosis not present

## 2011-12-28 DIAGNOSIS — R6889 Other general symptoms and signs: Secondary | ICD-10-CM | POA: Diagnosis not present

## 2011-12-28 LAB — URINE MICROSCOPIC-ADD ON

## 2011-12-28 LAB — URINALYSIS, ROUTINE W REFLEX MICROSCOPIC
Glucose, UA: NEGATIVE mg/dL
Specific Gravity, Urine: 1.015 (ref 1.005–1.030)
Urobilinogen, UA: 0.2 mg/dL (ref 0.0–1.0)

## 2011-12-28 MED ORDER — HYDROCODONE-ACETAMINOPHEN 5-325 MG PO TABS: ORAL_TABLET | ORAL | Status: DC

## 2011-12-28 MED ORDER — HYDROCODONE-ACETAMINOPHEN 5-325 MG PO TABS
2.0000 | ORAL_TABLET | Freq: Once | ORAL | Status: AC
  Administered 2011-12-28: 2 via ORAL
  Filled 2011-12-28: qty 2

## 2011-12-28 NOTE — ED Provider Notes (Signed)
History  This chart was scribed for Ward Givens, MD by Ladona Ridgel Day. This patient was seen in room APA03/APA03 and the patient's care was started at 0959.  Level 5 caveat to patient's speech impediment   CSN: [id number]  Arrival date & time 12/28/11  4098   First MD Initiated Contact with Patient 12/28/11 [phone]      Chief Complaint  Patient presents with  . Hip Pain   Patient is a 78 y.o. female presenting with hip pain. The history is provided by the patient. The history is limited by the condition of the patient. No language interpreter was used.  Hip Pain This is a new problem. The current episode started 2 days ago. The problem occurs constantly. The problem has been gradually worsening. Pertinent negatives include no chest pain and no shortness of breath. The symptoms are aggravated by standing. The symptoms are relieved by walking. She has tried nothing for the symptoms.   Carla Bonilla is a 78 y.o. female who presents to the Emergency Department complaining of constant gradually worsening left hip pain since two days ago with no recent falls or known injury. She states her pain actually feels better with walking and she started using a walker since previous CVA with right sided body weakness and hx of right hip arthritis. She lives at home alone and has assistance come to her house 5 days a week.   Her PCP is Dr. Juanetta Gosling  Past Medical History  Diagnosis Date  . CVA (cerebral vascular accident)   . Hypertension   . Blind left eye   . Glaucoma(365)   . Hearing loss     Past Surgical History  Procedure Date  . Shoulder surgery     RIGHT shoulder open treatment internal fixation with locking plate. Date of surgery November 25, 2010  . Orif humerus fracture 11/25/2010    Procedure: OPEN REDUCTION INTERNAL FIXATION (ORIF) PROXIMAL HUMERUS FRACTURE;  Surgeon: Fuller Canada, MD;  Location: [facility];  Service: Orthopedics;  Laterality: Right;    No family history on  file.  History  Substance Use Topics  . Smoking status: Never Smoker   . Smokeless tobacco: Not on file  . Alcohol Use: No  Lives alone Lives at home  OB History    Grav Para Term Preterm Abortions TAB SAB Ect Mult Living                  Review of Systems  Constitutional: Negative for fever and chills.  Respiratory: Negative for shortness of breath.   Cardiovascular: Negative for chest pain.  Gastrointestinal: Negative for nausea and vomiting.  Musculoskeletal:       Left hip pain  Neurological: Negative for weakness.  All other systems reviewed and are negative.    Allergies  Review of patient's allergies indicates no known allergies.  Home Medications   Current Outpatient Rx  Name Route Sig Dispense Refill  . AMLODIPINE BESY-BENAZEPRIL HCL 10-20 MG PO CAPS Oral Take 1 capsule by mouth daily.      . ASPIRIN EC 81 MG PO TBEC Oral Take 81 mg by mouth daily.      Marland Kitchen CLOPIDOGREL BISULFATE 75 MG PO TABS Oral Take 75 mg by mouth daily.      Marland Kitchen METOPROLOL TARTRATE 25 MG PO TABS Oral Take 25 mg by mouth 2 (two) times daily.      Marland Kitchen MOXIFLOXACIN HCL 0.5 % OP SOLN Left Eye Place 1 drop into the left eye  2 (two) times daily.      Marland Kitchen TIMOLOL MALEATE 0.25 % OP SOLN Right Eye Place 1 drop into the right eye 2 (two) times daily.       Triage Vitals: BP 133/59  Pulse 75  Temp 98.4 F (36.9 C) (Oral)  Resp 20  SpO2 98%  Vital signs normal    Physical Exam  Nursing note and vitals reviewed. Constitutional: She is oriented to person, place, and time. She appears well-developed and well-nourished.  Non-toxic appearance. She does not appear ill. No distress.  HENT:  Head: Normocephalic and atraumatic.  Right Ear: External ear normal.  Left Ear: External ear normal.  Nose: Nose normal. No mucosal edema or rhinorrhea.  Mouth/Throat: Oropharynx is clear and moist and mucous membranes are normal. No dental abscesses or uvula swelling.  Eyes: Conjunctivae normal and EOM are normal.  Pupils are equal, round, and reactive to light.  Neck: Normal range of motion and full passive range of motion without pain. Neck supple.  Cardiovascular: Normal rate, regular rhythm and normal heart sounds.  Exam reveals no gallop and no friction rub.   No murmur heard. Pulmonary/Chest: Effort normal and breath sounds normal. No respiratory distress. She has no wheezes. She has no rhonchi. She has no rales. She exhibits no tenderness and no crepitus.  Abdominal: Soft. Normal appearance and bowel sounds are normal. She exhibits no distension. There is no tenderness. There is no rebound and no guarding.  Musculoskeletal: Normal range of motion. She exhibits tenderness (Left posteior hip pain and proximal left lateral thigh). She exhibits no edema.       No shortening of LLE, no external or internal rotation. Good flexion at hip and knee w/out pain. Tender over left SI joint  Neurological: She is alert and oriented to person, place, and time. She has normal strength. No cranial nerve deficit.  Skin: Skin is warm, dry and intact. No rash noted. No erythema. No pallor.  Psychiatric: She has a normal mood and affect. Her speech is normal and behavior is normal. Her mood appears not anxious.    ED Course  Procedures (including critical care time)   Medications  HYDROcodone-acetaminophen (NORCO/VICODIN) 5-325 MG per tablet 2 tablet (2 tablet Oral Given 12/28/11 1016)    DIAGNOSTIC STUDIES: Oxygen Saturation is 98% on room air, normal by my interpretation.    COORDINATION OF CARE: At 1000 AM Discussed treatment plan with patient which includes pain medicine, UA, lumbar spine, left hip X-ray. Patient agrees.   1250 PM I rechecked patient who is still in NAD and feels better after her initial dose of pain medicine in the ED. I informed her of negative imaging results which show no hip fracture. I discusses plan with patient to discharge home and return to ED if her symptoms do not improve or change.      Results for orders placed during the hospital encounter of 12/28/11  URINALYSIS, ROUTINE W REFLEX MICROSCOPIC      Component Value Range   Color, Urine YELLOW  YELLOW   APPearance CLEAR  CLEAR   Specific Gravity, Urine 1.015  1.005 - 1.030   pH 7.5  5.0 - 8.0   Glucose, UA NEGATIVE  NEGATIVE mg/dL   Hgb urine dipstick SMALL (*) NEGATIVE   Bilirubin Urine NEGATIVE  NEGATIVE   Ketones, ur NEGATIVE  NEGATIVE mg/dL   Protein, ur NEGATIVE  NEGATIVE mg/dL   Urobilinogen, UA 0.2  0.0 - 1.0 mg/dL   Nitrite NEGATIVE  NEGATIVE   Leukocytes, UA NEGATIVE  NEGATIVE  URINE MICROSCOPIC-ADD ON      Component Value Range   Squamous Epithelial / LPF FEW (*) RARE   WBC, UA 0-2  <3 WBC/hpf   RBC / HPF 7-10  <3 RBC/hpf   Bacteria, UA RARE  RARE   Dg Lumbar Spine Complete  12/28/2011  *RADIOLOGY REPORT*  Clinical Data: Low back and left hip pain, no known injury  LUMBAR SPINE - COMPLETE 4+ VIEW  Comparison: None  Findings: Five non-rib bearing lumbar vertebrae. Osseous demineralization. Levoconvex thoracolumbar scoliosis apex L2. Multilevel disc space narrowing and endplate spur formation. Multilevel facet degenerative changes. No acute fracture, subluxation or bone destruction. Extensive atherosclerotic calcifications. No definite spondylolysis.  IMPRESSION: Advanced degenerative disc and facet disease changes of the lumbar spine with associated thoracolumbar levoconvex scoliosis. No definite acute abnormalities.   Original Report Authenticated By: Lollie Marrow, M.D.    Dg Hip Complete Left  12/28/2011  *RADIOLOGY REPORT*  Clinical Data: Low back and left hip pain, no known injury  LEFT HIP - COMPLETE 2+ VIEW  Comparison: Right hip radiographs 11/20/2010  Findings: Osseous demineralization. Degenerative disc and facet disease changes lower lumbar spine with levoconvex scoliosis. SI joints symmetric. Mild narrowing of left hip joint. Marked narrowing of the right hip joint with superolateral subluxation  of the right femoral head, significant spur formation, and flattening / destruction of the cranial margin of the right femoral head. Subchondral cystic changes right hip joint. No acute fracture, dislocation or bone destruction. Scattered atherosclerotic calcification.  IMPRESSION: Osseous demineralization. Mild degenerative changes left hip joint. Advanced osteoarthritic changes of the right hip joint.   Original Report Authenticated By: Lollie Marrow, M.D.     1. Hip pain, left     New Prescriptions   HYDROCODONE-ACETAMINOPHEN (NORCO/VICODIN) 5-325 MG PER TABLET    Take 1 or 2 po Q 6hrs for pain    Plan discharge  Devoria Albe, MD, FACEP   MDM    I personally performed the services described in this documentation, which was scribed in my presence. The recorded information has been reviewed and considered.  Devoria Albe, MD, Armando Gang          Ward Givens, MD 12/28/11 [phone]

## 2011-12-28 NOTE — ED Notes (Signed)
Pt returned from xray, meds given, xray transporter returned and transported pt back to ct scan.

## 2011-12-28 NOTE — ED Notes (Signed)
Pt reports that she has left hip pain all the time,but for past 2 days has been unable to walk. Denies any recent injury is from home and has sitters 5 days a week.

## 2011-12-28 NOTE — ED Notes (Signed)
Pt c/o left hip pain radiating down leg leg since yesterday.

## 2011-12-28 NOTE — ED Notes (Signed)
Instructions/prescriptions reviewed and f/u information provided.  Verbalizes understanding. Alert, in no distress. Escorted to POV via wheelchair by ED tech,

## 2012-01-03 ENCOUNTER — Telehealth: Payer: Self-pay | Admitting: Orthopedic Surgery

## 2012-01-03 NOTE — Telephone Encounter (Signed)
Call received from patient's niece and POA, Rayetta Pigg, stating that patient is having increased pain in hip and upper part of leg.  She had been offered appointment last week, per conversation with caregiver, and opted to take the first available morning appointment, which we scheduled for 01/06/12, as a Emergency Room follow up visit.  Patient's POA was in town from [place] over this past weekend, and felt that patient needed to be seen as she cannot move out of bed.  Relayed that Dr. Romeo Apple is in surgery, and it would be in their best interest to take patient back to the Emergency Room for a complete work-up.  Ms. Vear Clock agrees, however, said she may wait until tomorrow so that she can arrange transport; also states patient "really wants Dr. Romeo Apple," if any further treatment is needed.  POA/niece Rayetta Pigg' phone # [phone].

## 2012-01-03 NOTE — Telephone Encounter (Signed)
ok 

## 2012-01-04 ENCOUNTER — Encounter (HOSPITAL_COMMUNITY): Payer: Self-pay

## 2012-01-04 ENCOUNTER — Emergency Department (HOSPITAL_COMMUNITY)
Admission: EM | Admit: 2012-01-04 | Discharge: 2012-01-04 | Disposition: A | Discharge status: Home / Self Care | Payer: Medicare Other | Attending: Emergency Medicine | Admitting: Emergency Medicine

## 2012-01-04 DIAGNOSIS — Z7982 Long term (current) use of aspirin: Secondary | ICD-10-CM | POA: Diagnosis not present

## 2012-01-04 DIAGNOSIS — M79609 Pain in unspecified limb: Secondary | ICD-10-CM | POA: Diagnosis not present

## 2012-01-04 DIAGNOSIS — Z79899 Other long term (current) drug therapy: Secondary | ICD-10-CM | POA: Diagnosis not present

## 2012-01-04 DIAGNOSIS — M5430 Sciatica, unspecified side: Secondary | ICD-10-CM | POA: Insufficient documentation

## 2012-01-04 DIAGNOSIS — R6889 Other general symptoms and signs: Secondary | ICD-10-CM | POA: Diagnosis not present

## 2012-01-04 DIAGNOSIS — Z8673 Personal history of transient ischemic attack (TIA), and cerebral infarction without residual deficits: Secondary | ICD-10-CM | POA: Diagnosis not present

## 2012-01-04 DIAGNOSIS — M25559 Pain in unspecified hip: Secondary | ICD-10-CM | POA: Insufficient documentation

## 2012-01-04 DIAGNOSIS — M5432 Sciatica, left side: Secondary | ICD-10-CM

## 2012-01-04 DIAGNOSIS — I10 Essential (primary) hypertension: Secondary | ICD-10-CM | POA: Diagnosis not present

## 2012-01-04 MED ORDER — KETOROLAC TROMETHAMINE 60 MG/2ML IM SOLN
60.0000 mg | Freq: Once | INTRAMUSCULAR | Status: AC
  Administered 2012-01-04: 60 mg via INTRAMUSCULAR
  Filled 2012-01-04: qty 2

## 2012-01-04 MED ORDER — PREDNISONE 10 MG PO TABS: 20.0000 mg | ORAL_TABLET | Freq: Every day | ORAL | Status: DC

## 2012-01-04 MED ORDER — PREDNISONE 20 MG PO TABS
40.0000 mg | ORAL_TABLET | Freq: Once | ORAL | Status: AC
  Administered 2012-01-04: 40 mg via ORAL
  Filled 2012-01-04: qty 2

## 2012-01-04 NOTE — ED Notes (Addendum)
Per friend of pt, she has had a stroke before that caused her to now have incomprehensiable slurred speech. Per friend the pt has had left hip pain since last Monday. She denies any trauma to hip but states the pt does PT exercises and hip started hurting the day after she did her exercises. She was seen at the hospital Tuesday and sent home with pain medicine. Pain has not improved and pt is unable to ambulate per her usual and cannot ambulate to bathroom. Pt is able to move lower extremities bilaterally and has palpable dorsalis pedis pulses.

## 2012-01-04 NOTE — ED Notes (Signed)
Pt c/o left hip and leg pain.  Reports has arthirits.  Has appt to see Dr. Romeo Apple this Thursday.  Denies injury to hip.

## 2012-01-04 NOTE — ED Provider Notes (Signed)
History  This chart was scribed for Carla Hutching, MD by Ladona Ridgel Day. This patient was seen in room APA06/APA06 and the patient's care was started at 1011.  CSN: [id number]  Arrival date & time 01/04/12  1011   First MD Initiated Contact with Patient 01/04/12 1047     Chief Complaint  Patient presents with  . Hip Pain   Patient is a 78 y.o. female presenting with hip pain. The history is provided by the patient. No language interpreter was used.  Hip Pain This is a chronic problem. The current episode started more than 1 week ago. The problem occurs constantly. The problem has not changed since onset.Pertinent negatives include no chest pain, no abdominal pain and no shortness of breath. Nothing aggravates the symptoms. The symptoms are relieved by walking. Treatments tried: pain medicine. The treatment provided mild relief.   Carla Bonilla is a 78 y.o. female who presents to the Emergency Department complaining of constant gradually worsening left hip pain since 1 week ago with no recent falls or known injury. She states her pain actually feels better with walking and she started using a walker since previous CVA with right sided body weakness and hx of right hip arthritis. She lives at home alone and has assistance come to her house 5 days a week. She has an appointment to see Dr. Romeo Apple this week.  Past Medical History  Diagnosis Date  . Hypertension   . Blind left eye   . Glaucoma(365)   . Hearing loss   . CVA (cerebral vascular accident)     speech problem    Past Surgical History  Procedure Date  . Shoulder surgery     RIGHT shoulder open treatment internal fixation with locking plate. Date of surgery November 25, 2010  . Orif humerus fracture 11/25/2010    Procedure: OPEN REDUCTION INTERNAL FIXATION (ORIF) PROXIMAL HUMERUS FRACTURE;  Surgeon: Fuller Canada, MD;  Location: [facility];  Service: Orthopedics;  Laterality: Right;    No family history on file.  History    Substance Use Topics  . Smoking status: Never Smoker   . Smokeless tobacco: Not on file  . Alcohol Use: No    OB History    Grav Para Term Preterm Abortions TAB SAB Ect Mult Living                  Review of Systems  Constitutional: Negative for fever and chills.  HENT: Negative for congestion.   Respiratory: Negative for shortness of breath.   Cardiovascular: Negative for chest pain.  Gastrointestinal: Negative for nausea, vomiting and abdominal pain.  Musculoskeletal:       Left hip pain  Neurological: Negative for weakness.  All other systems reviewed and are negative.    Allergies  Review of patient's allergies indicates no known allergies.  Home Medications   Current Outpatient Rx  Name Route Sig Dispense Refill  . HYDROCODONE-ACETAMINOPHEN 5-325 MG PO TABS  Take 1 or 2 po Q 6hrs for pain 25 tablet 0  . MOXIFLOXACIN HCL 0.5 % OP SOLN Left Eye Place 1 drop into the left eye 2 (two) times daily.     Marland Kitchen TIMOLOL MALEATE 0.25 % OP SOLN Right Eye Place 1 drop into the right eye 2 (two) times daily.     Marland Kitchen AMLODIPINE BESY-BENAZEPRIL HCL 10-20 MG PO CAPS Oral Take 1 capsule by mouth daily.     . ASPIRIN EC 81 MG PO TBEC Oral Take 81 mg by  mouth daily.      Marland Kitchen CLOPIDOGREL BISULFATE 75 MG PO TABS Oral Take 75 mg by mouth daily.      Marland Kitchen METOPROLOL TARTRATE 25 MG PO TABS Oral Take 25 mg by mouth 2 (two) times daily.        Triage Vitals: BP 151/57  Pulse 63  Temp 98.3 F (36.8 C) (Oral)  Resp 16  Ht 5\' 3"  (1.6 m)  Wt 143 lb (64.864 kg)  BMI 25.33 kg/m2  SpO2 96%  Physical Exam  Nursing note and vitals reviewed. Constitutional: She is oriented to person, place, and time. She appears well-developed and well-nourished.       Speech impediment from previous CVA  HENT:  Head: Normocephalic and atraumatic.  Eyes: Conjunctivae normal and EOM are normal. Pupils are equal, round, and reactive to light.  Neck: Normal range of motion. Neck supple.  Cardiovascular: Normal rate,  regular rhythm and normal heart sounds.   Pulmonary/Chest: Effort normal and breath sounds normal. No respiratory distress.  Abdominal: Soft. Bowel sounds are normal.  Musculoskeletal: Normal range of motion. She exhibits tenderness.       Tenderness left sciatic nerve distribution  Neurological: She is alert and oriented to person, place, and time.       Awake, alert and appropriate mentation  Skin: Skin is warm and dry.  Psychiatric: She has a normal mood and affect.    ED Course  Procedures (including critical care time) DIAGNOSTIC STUDIES: Oxygen Saturation is 96% on room air, adequate by my interpretation.    COORDINATION OF CARE: At 1124 AM Discussed treatment plan with patient which includes discuss workup with Dr. Romeo Apple. Patient agrees.   Labs Reviewed - No data to display No results found.   No diagnosis found.    MDM   History and physical consistent with sciatica. We'll start prednisone. Followup appointment with Dr. Romeo Apple on Thursday.  Recent left hip x-ray shows arthritic changes      I personally performed the services described in this documentation, which was scribed in my presence. The recorded information has been reviewed and considered.          Carla Hutching, MD 01/04/12 1350

## 2012-01-05 NOTE — Telephone Encounter (Signed)
Patient's appointment is on schedule for tomorrow 01/06/12, and as far as niece Bonita Quin, [place], knows, they will keep this appointment.

## 2012-01-06 ENCOUNTER — Ambulatory Visit (INDEPENDENT_AMBULATORY_CARE_PROVIDER_SITE_OTHER): Payer: Medicare Other | Admitting: Orthopedic Surgery

## 2012-01-06 ENCOUNTER — Encounter: Payer: Self-pay | Admitting: Orthopedic Surgery

## 2012-01-06 VITALS — BP 130/70 | Ht 63.0 in | Wt 143.0 lb

## 2012-01-06 DIAGNOSIS — M48061 Spinal stenosis, lumbar region without neurogenic claudication: Secondary | ICD-10-CM | POA: Diagnosis not present

## 2012-01-06 DIAGNOSIS — IMO0002 Reserved for concepts with insufficient information to code with codable children: Secondary | ICD-10-CM | POA: Diagnosis not present

## 2012-01-06 DIAGNOSIS — M5430 Sciatica, unspecified side: Secondary | ICD-10-CM | POA: Diagnosis not present

## 2012-01-06 DIAGNOSIS — M5416 Radiculopathy, lumbar region: Secondary | ICD-10-CM | POA: Insufficient documentation

## 2012-01-06 MED ORDER — GABAPENTIN 100 MG PO CAPS: 100.0000 mg | ORAL_CAPSULE | Freq: Three times a day (TID) | ORAL | Status: DC

## 2012-01-06 MED ORDER — PREDNISONE 10 MG PO KIT: 10.0000 mg | PACK | ORAL | Status: DC

## 2012-01-06 NOTE — Patient Instructions (Addendum)
Start new medications   Apply heat as needed to the back   You have been scheduled for an MRI scan.  Your insurance company requires a precertification prior to scheduling the MRI.  If the MRI scan is not approved we will let you know and make further treatment recommendations according to your insurance's guidelines.  We will schedule you for another  appointment to review the results and make further treatment recommendations

## 2012-01-06 NOTE — Progress Notes (Signed)
Patient ID: Carla Bonilla, female   DOB: August 21, 1920, 78 y.o.   MRN: [id number] Chief Complaint  Patient presents with  . Hip Pain    left hip pain , sudden onset 12/25/11, no known injury   The patient presents from the emergency room as a followup and referral with acute onset of pain in her left leg radiating from her back down into her left hip thigh and knee which started on 12/25/2011. Initial ER evaluation revealed possibility of neurogenic pain from sciatica but she presented back to the emergency room approximately 2 days ago with the same symptoms unresponsive to hydrocodone. She did get an injection of intramuscular steroid and was placed on 20 mg of prednisone daily with minimal improvement  She complains of sharp throbbing radiating pain which is intermittent and worse when she tries to stand walk or move. She is better when she's lying recumbent.  She has some frequency easy bleeding and bruising she is on Plavix her eyes will get red at times she denies fever chills fatigue chest pain shortness of breath or heartburn  Past Medical History  Diagnosis Date  . Hypertension   . Blind left eye   . Glaucoma(365)   . Hearing loss   . CVA (cerebral vascular accident)     speech problem    BP 130/70  Ht 5\' 3"  (1.6 m)  Wt 143 lb (64.864 kg)  BMI 25.33 kg/m2  The patient's overall appearance is normal  She is oriented x3  She has a speech impediment  Her mood and affect are excellent  She was examined lying recumbent in a comfortable position. The caregivers at her with her say that she can get to the bathroom and mobilized bed to chair.  She has full active range of motion in the left hip joint with no tenderness over the hip joint or greater trochanter the hip is stable she has normal strength in the hip flexors skin is normal  Pulse and temperature are normal in the left leg without edema. No lymphadenopathy in the groin. She has decreased sensation in L5 distribution.  She has a positive straight leg raise at 60. Her coordination and balance could not be assessed  She was tender in the lumbar spine, left gluteal area.  X-rays were done at the hospital and were reviewed including pelvis hip and lumbar  Summation she has a severely diseased right hip with arthritis the left hip has mild minimal degenerative changes and she has scoliosis with degenerative disc disease and spondylosis of the lumbar spine  Impression spinal stenosis with sciatic neuralgia  Recommend steroid Dosepak, start Neurontin titrated up to 300 mg 3 times a day  MRI lumbar spine to prepare for epidural injections

## 2012-01-10 DIAGNOSIS — I6789 Other cerebrovascular disease: Secondary | ICD-10-CM | POA: Diagnosis not present

## 2012-01-10 DIAGNOSIS — E785 Hyperlipidemia, unspecified: Secondary | ICD-10-CM | POA: Diagnosis not present

## 2012-01-10 DIAGNOSIS — H409 Unspecified glaucoma: Secondary | ICD-10-CM | POA: Diagnosis not present

## 2012-01-10 DIAGNOSIS — Z23 Encounter for immunization: Secondary | ICD-10-CM | POA: Diagnosis not present

## 2012-01-10 DIAGNOSIS — I10 Essential (primary) hypertension: Secondary | ICD-10-CM | POA: Diagnosis not present

## 2012-01-14 ENCOUNTER — Ambulatory Visit (HOSPITAL_COMMUNITY)
Admission: RE | Admit: 2012-01-14 | Discharge: 2012-01-14 | Disposition: A | Discharge status: Home / Self Care | Payer: Medicare Other | Source: Ambulatory Visit | Attending: Orthopedic Surgery | Admitting: Orthopedic Surgery

## 2012-01-14 DIAGNOSIS — M4120 Other idiopathic scoliosis, site unspecified: Secondary | ICD-10-CM | POA: Diagnosis not present

## 2012-01-14 DIAGNOSIS — M5430 Sciatica, unspecified side: Secondary | ICD-10-CM

## 2012-01-14 DIAGNOSIS — M47817 Spondylosis without myelopathy or radiculopathy, lumbosacral region: Secondary | ICD-10-CM | POA: Diagnosis not present

## 2012-01-14 DIAGNOSIS — M479 Spondylosis, unspecified: Secondary | ICD-10-CM | POA: Diagnosis not present

## 2012-01-19 ENCOUNTER — Encounter: Payer: Self-pay | Admitting: Orthopedic Surgery

## 2012-01-19 ENCOUNTER — Ambulatory Visit (INDEPENDENT_AMBULATORY_CARE_PROVIDER_SITE_OTHER): Payer: Medicare Other | Admitting: Orthopedic Surgery

## 2012-01-19 VITALS — BP 90/60 | Ht 63.0 in | Wt 143.0 lb

## 2012-01-19 DIAGNOSIS — M5430 Sciatica, unspecified side: Secondary | ICD-10-CM | POA: Diagnosis not present

## 2012-01-19 MED ORDER — GABAPENTIN 100 MG PO CAPS: 100.0000 mg | ORAL_CAPSULE | Freq: Three times a day (TID) | ORAL | Status: DC

## 2012-01-19 MED ORDER — PREDNISONE 10 MG PO KIT: 10.0000 mg | PACK | ORAL | Status: DC

## 2012-01-19 NOTE — Progress Notes (Signed)
Patient ID: Carla Bonilla, female   DOB: [birth year], 78 y.o.   MRN: [id number] Chief Complaint  Patient presents with  . Results    MRI L spine results    [phone] call Niece   Spinal Stenosis L5  root impingement   IMPRESSION:  1. Diffuse signal abnormality in the upper sacrum has an H  configuration and is most consistent with a sacral insufficiency  fracture.  2. Although this sacral abnormality does not show any definite  pathologic features, there are several indeterminate marrow lesions  within the spine and the mid sacrum. Metastatic disease/myeloma  cannot be excluded.  3. Multilevel spondylosis associated with a convex left scoliosis.  There is mild to moderate multifactorial spinal stenosis at L4-L5  with left lateral recess stenosis and possible left L5 nerve root  encroachment.  The patient will be referred to a neurosurgeon for further treatment and management no followup. Continue Neurontin and steroid Dosepak

## 2012-01-19 NOTE — Patient Instructions (Addendum)
Referral to Neurosurgeon in [place]   Spinal Stenosis One cause of back pain is spinal stenosis. Stenosis means abnormal narrowing. The spinal canal contains and protects the spinal nerve roots. In spinal stenosis, the spinal canal narrows and pinches the spinal cord and nerves. This causes low back pain and pain in the legs. Stenosis may pinch the nerves that control muscles and sensation in the legs. This leads to pain and abnormal feelings in the leg muscles and areas supplied by those nerves. CAUSES   Spinal stenosis often happens to people as they get older and arthritic boney growths occur in their spinal canal. There is also a loss of the disk height between the bones of the back, which also adds to this problem. Sometimes the problem is present at birth. SYMPTOMS    Pain that is generally worse with activities, particularly standing and walking.   Numbness, tingling, hot or cold feelings, weakness, or a weariness in the legs.   Clumsiness, frequent falling, and a foot-slapping gait, which may come as a result of nerve pressure and muscle weakness.  DIAGNOSIS    Your caregiver may suspect spinal stenosis if you have unusual leg symptoms, such as those previously mentioned.   Your orthopedic surgeon may request special imaging exams, such a computerized magnetic scan (MRI) or computerized X-ray scan (CT) to find out the cause of the problem.  TREATMENT    Sometimes treatments such as postural changes or nonsteroidal anti-inflammatory drugs will relieve the pain.   Nonsteroidal anti-inflammatory medications may help relieve symptoms. These medicines do this by decreasing swelling and inflammation in the nerves.   When stenosis causes severe nerve root compression, conservative treatment may not be enough to maintain a normal lifestyle. Surgery may be recommended to relieve the pressure on affected nerves. In properly selected patients, the results are very good, and patients are able  to continue a normal lifestyle.  HOME CARE INSTRUCTIONS    Flexing the spine by leaning forward while walking may relieve symptoms. Lying with the knees drawn up to the chest may offer some relief. These positions enlarge the space available to the nerves. They may make it easier for stenosis sufferers to walk longer distances.   Rest, followed by gradually resuming activity, also can help.   Aerobic activity, such as bicycling or swimming, is often recommended.   Losing weight can also relieve some of the load on the spine.   Application of warm or cold compresses to the area of pain can be helpful.  SEEK MEDICAL CARE IF:    The periods of relief between episodes of pain become shorter and shorter.   You experience pain that radiates down your leg, even when you are not standing or walking.  SEEK IMMEDIATE MEDICAL CARE IF:    You have a loss of bowel or bladder control.   You have a sudden loss of feeling in your legs.   You suddenly cannot move your legs.  Document Released: 05/29/2003 Document Revised: 05/31/2011 Document Reviewed: 07/24/2009 [facility] Patient Information 2013 [place], [place].

## 2012-01-20 ENCOUNTER — Telehealth: Payer: Self-pay | Admitting: Orthopedic Surgery

## 2012-01-20 ENCOUNTER — Telehealth: Payer: Self-pay | Admitting: *Deleted

## 2012-01-20 ENCOUNTER — Other Ambulatory Visit: Payer: Self-pay | Admitting: *Deleted

## 2012-01-20 DIAGNOSIS — T8484XA Pain due to internal orthopedic prosthetic devices, implants and grafts, initial encounter: Secondary | ICD-10-CM

## 2012-01-20 NOTE — Telephone Encounter (Signed)
Patient's niece and POA Rayetta Pigg, who lives in [place], [phone],    called back, also aware of patient's appointment at [facility], and will see that she gets there - making arrangements with local family, friend to transport her there.

## 2012-01-20 NOTE — Telephone Encounter (Signed)
Call received from [facility], with patient's referral appointment -- scheduled Monday, 01/24/12, 10:30AM, with Dr. Marikay Alar.  She has contacted patient to relay.

## 2012-01-20 NOTE — Telephone Encounter (Signed)
Referral sent to [facility] for spinal stenosis and L5 root impingement

## 2012-01-24 DIAGNOSIS — S3210XA Unspecified fracture of sacrum, initial encounter for closed fracture: Secondary | ICD-10-CM | POA: Diagnosis not present

## 2012-01-24 DIAGNOSIS — IMO0002 Reserved for concepts with insufficient information to code with codable children: Secondary | ICD-10-CM | POA: Diagnosis not present

## 2012-01-25 ENCOUNTER — Other Ambulatory Visit (HOSPITAL_COMMUNITY): Payer: Self-pay | Admitting: Pulmonary Disease

## 2012-01-25 DIAGNOSIS — H409 Unspecified glaucoma: Secondary | ICD-10-CM | POA: Diagnosis not present

## 2012-01-25 DIAGNOSIS — I6789 Other cerebrovascular disease: Secondary | ICD-10-CM | POA: Diagnosis not present

## 2012-01-25 DIAGNOSIS — I10 Essential (primary) hypertension: Secondary | ICD-10-CM | POA: Diagnosis not present

## 2012-01-25 DIAGNOSIS — IMO0002 Reserved for concepts with insufficient information to code with codable children: Secondary | ICD-10-CM

## 2012-01-25 DIAGNOSIS — E785 Hyperlipidemia, unspecified: Secondary | ICD-10-CM | POA: Diagnosis not present

## 2012-02-24 ENCOUNTER — Ambulatory Visit (HOSPITAL_COMMUNITY)
Admission: RE | Admit: 2012-02-24 | Discharge: 2012-02-24 | Disposition: A | Discharge status: Home / Self Care | Payer: Medicare Other | Source: Ambulatory Visit | Attending: Pulmonary Disease | Admitting: Pulmonary Disease

## 2012-02-24 DIAGNOSIS — IMO0002 Reserved for concepts with insufficient information to code with codable children: Secondary | ICD-10-CM | POA: Insufficient documentation

## 2012-02-24 DIAGNOSIS — Z780 Asymptomatic menopausal state: Secondary | ICD-10-CM | POA: Diagnosis not present

## 2012-02-24 DIAGNOSIS — X58XXXA Exposure to other specified factors, initial encounter: Secondary | ICD-10-CM | POA: Insufficient documentation

## 2012-02-24 DIAGNOSIS — M949 Disorder of cartilage, unspecified: Secondary | ICD-10-CM | POA: Diagnosis not present

## 2012-02-24 DIAGNOSIS — M899 Disorder of bone, unspecified: Secondary | ICD-10-CM | POA: Diagnosis not present

## 2012-04-03 DIAGNOSIS — R05 Cough: Secondary | ICD-10-CM | POA: Diagnosis not present

## 2012-04-03 DIAGNOSIS — M1990 Unspecified osteoarthritis, unspecified site: Secondary | ICD-10-CM | POA: Diagnosis not present

## 2012-04-03 DIAGNOSIS — I10 Essential (primary) hypertension: Secondary | ICD-10-CM | POA: Diagnosis not present

## 2012-04-03 DIAGNOSIS — E785 Hyperlipidemia, unspecified: Secondary | ICD-10-CM | POA: Diagnosis not present

## 2012-05-08 ENCOUNTER — Other Ambulatory Visit: Payer: Self-pay | Admitting: *Deleted

## 2012-05-08 DIAGNOSIS — M5430 Sciatica, unspecified side: Secondary | ICD-10-CM

## 2012-05-08 MED ORDER — GABAPENTIN 100 MG PO CAPS: 100.0000 mg | ORAL_CAPSULE | Freq: Three times a day (TID) | ORAL | Status: DC

## 2012-07-03 DIAGNOSIS — R609 Edema, unspecified: Secondary | ICD-10-CM | POA: Diagnosis not present

## 2012-07-03 DIAGNOSIS — I10 Essential (primary) hypertension: Secondary | ICD-10-CM | POA: Diagnosis not present

## 2012-07-03 DIAGNOSIS — I6789 Other cerebrovascular disease: Secondary | ICD-10-CM | POA: Diagnosis not present

## 2012-08-03 ENCOUNTER — Ambulatory Visit (INDEPENDENT_AMBULATORY_CARE_PROVIDER_SITE_OTHER): Payer: Medicare Other | Admitting: Otolaryngology

## 2012-08-15 DIAGNOSIS — H409 Unspecified glaucoma: Secondary | ICD-10-CM | POA: Diagnosis not present

## 2012-08-15 DIAGNOSIS — E785 Hyperlipidemia, unspecified: Secondary | ICD-10-CM | POA: Diagnosis not present

## 2012-08-15 DIAGNOSIS — I10 Essential (primary) hypertension: Secondary | ICD-10-CM | POA: Diagnosis not present

## 2012-08-24 ENCOUNTER — Ambulatory Visit (INDEPENDENT_AMBULATORY_CARE_PROVIDER_SITE_OTHER): Payer: Medicare Other | Admitting: Otolaryngology

## 2012-11-15 DIAGNOSIS — R062 Wheezing: Secondary | ICD-10-CM | POA: Diagnosis not present

## 2012-11-15 DIAGNOSIS — E785 Hyperlipidemia, unspecified: Secondary | ICD-10-CM | POA: Diagnosis not present

## 2012-11-15 DIAGNOSIS — I10 Essential (primary) hypertension: Secondary | ICD-10-CM | POA: Diagnosis not present

## 2012-11-15 DIAGNOSIS — I6789 Other cerebrovascular disease: Secondary | ICD-10-CM | POA: Diagnosis not present

## 2012-12-01 DIAGNOSIS — H409 Unspecified glaucoma: Secondary | ICD-10-CM | POA: Diagnosis not present

## 2012-12-01 DIAGNOSIS — H4011X0 Primary open-angle glaucoma, stage unspecified: Secondary | ICD-10-CM | POA: Diagnosis not present

## 2013-01-16 DIAGNOSIS — H1030 Unspecified acute conjunctivitis, unspecified eye: Secondary | ICD-10-CM | POA: Diagnosis not present

## 2013-01-18 DIAGNOSIS — H1030 Unspecified acute conjunctivitis, unspecified eye: Secondary | ICD-10-CM | POA: Diagnosis not present

## 2013-01-18 IMAGING — CR DG LUMBAR SPINE COMPLETE 4+V
5 series · 5 of 5 positions shown · non-contrast
Comparison: None

CLINICAL DATA: Low back and left hip pain, no known injury

LUMBAR SPINE - COMPLETE 4+ VIEW

[view not recorded (1 of 5)]
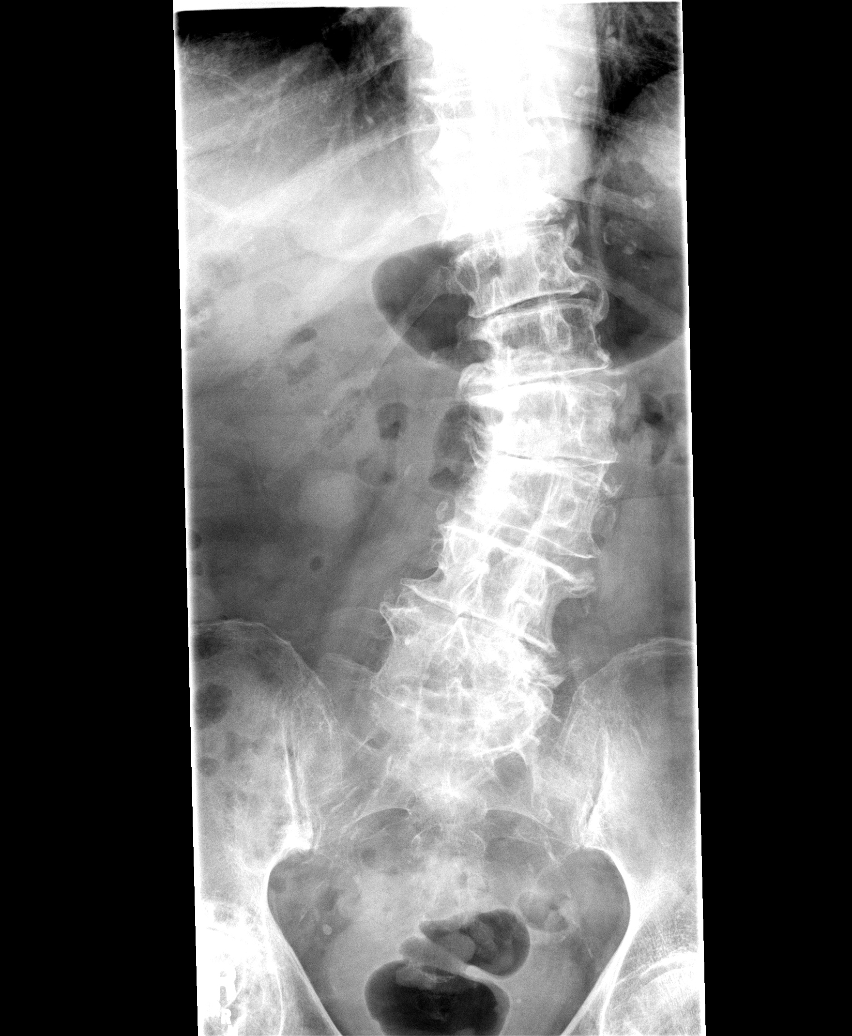

[view not recorded (2 of 5)]
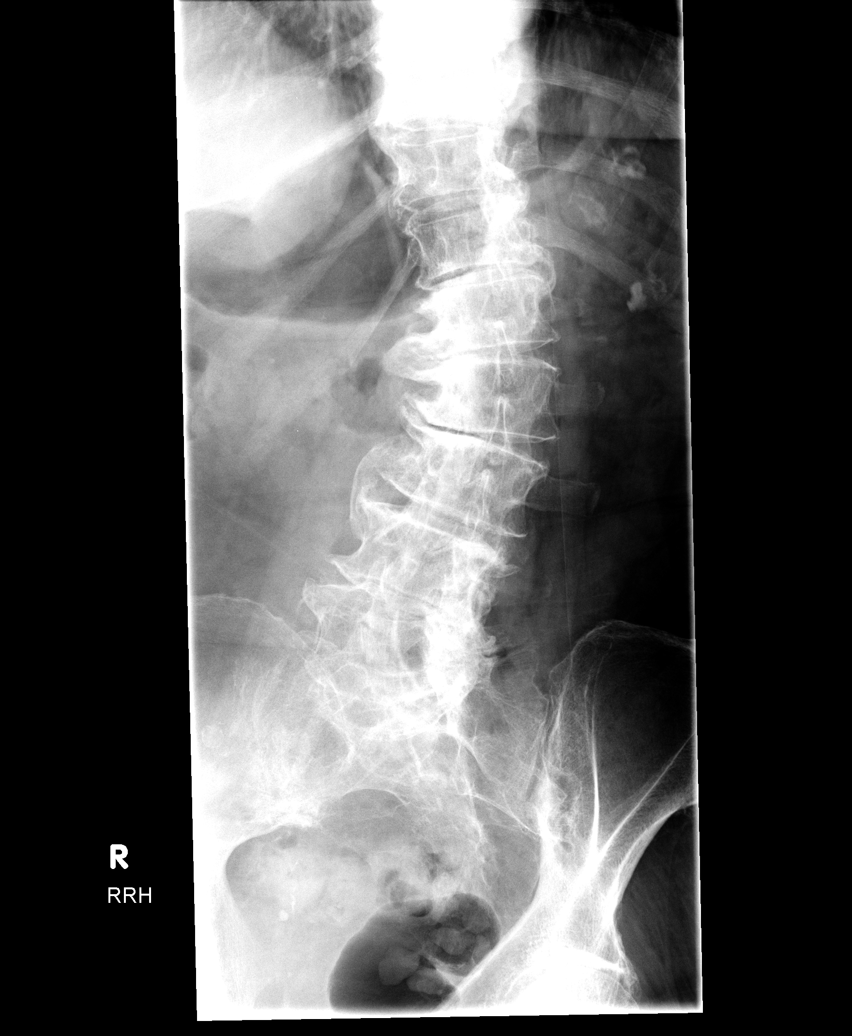

[view not recorded (3 of 5)]
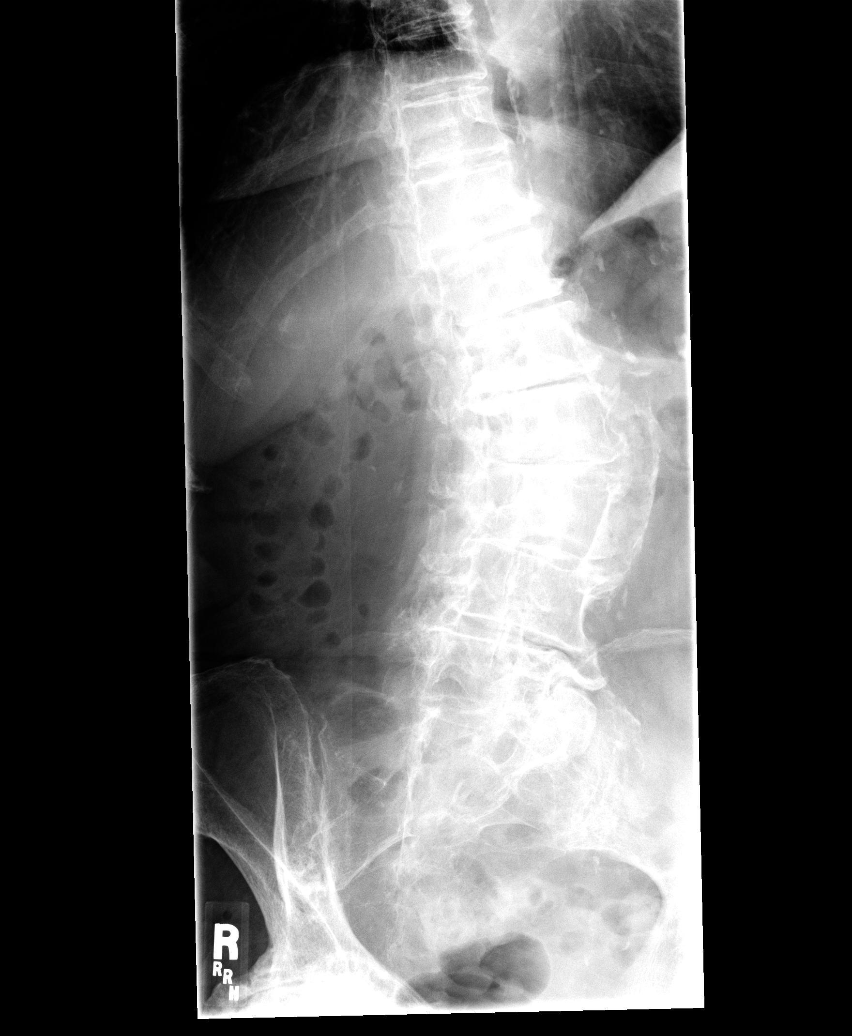

[view not recorded (4 of 5)]
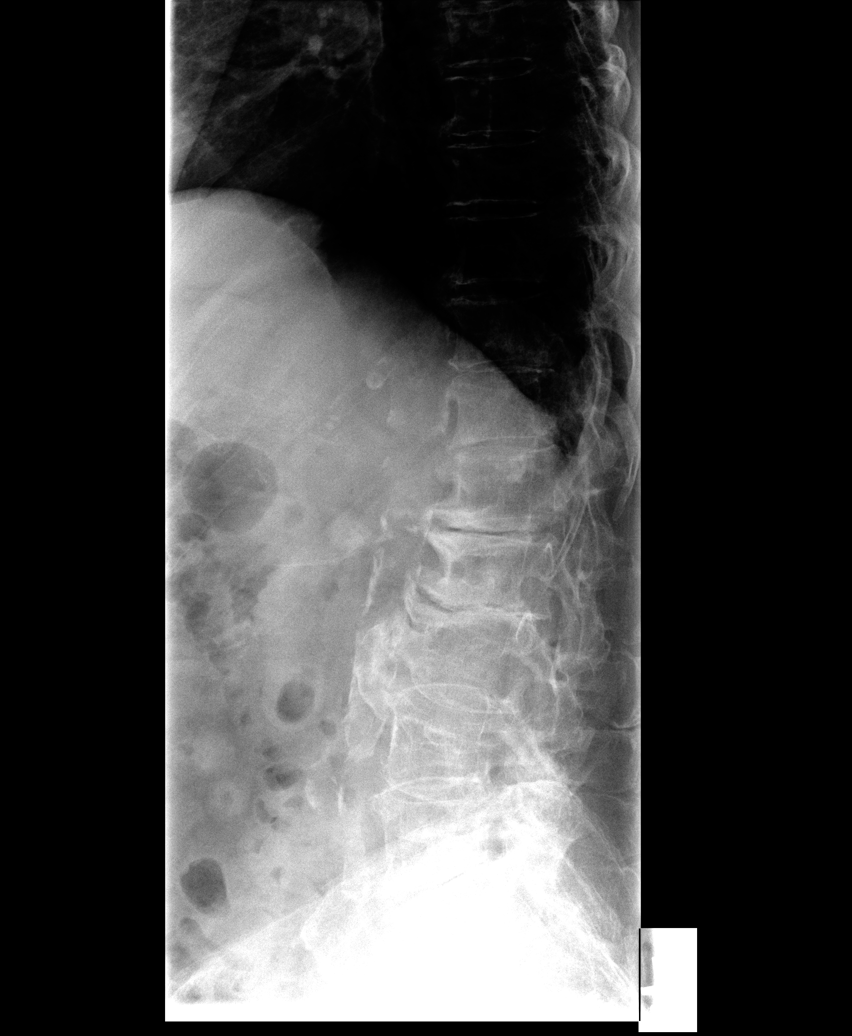

[view not recorded (5 of 5)]
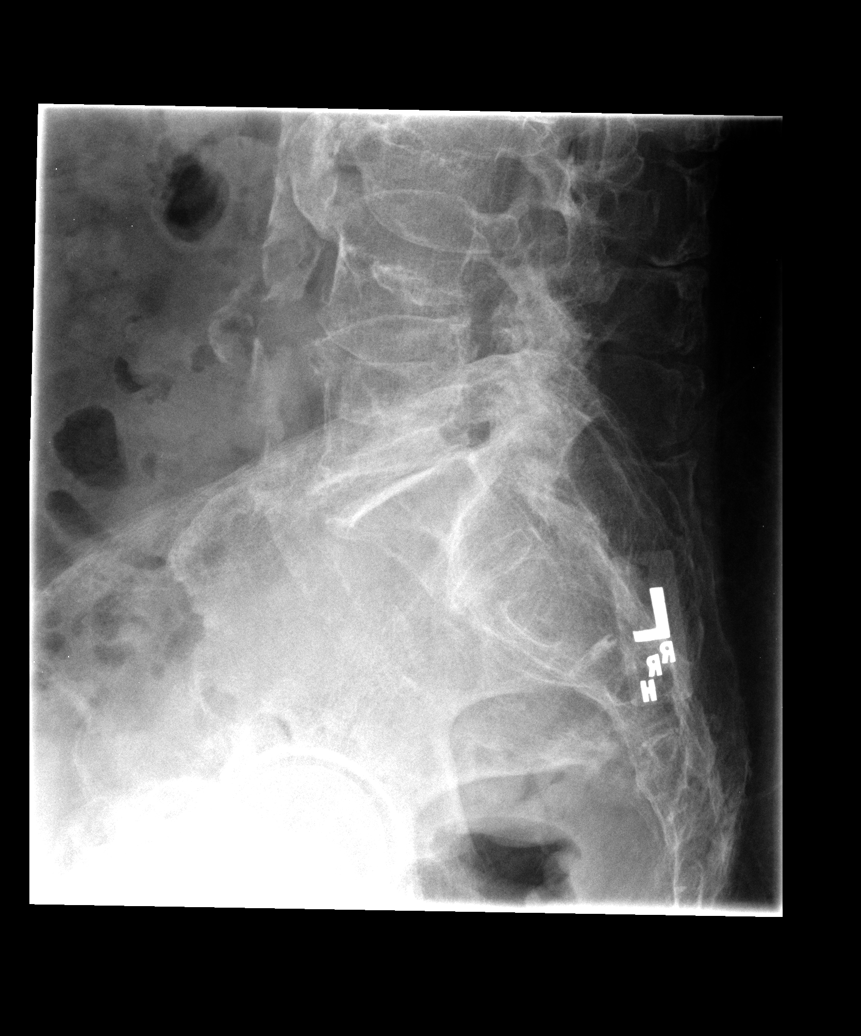

[5 of 5 positions shown; findings below may reference images not displayed]

FINDINGS: Five non-rib bearing lumbar vertebrae.
Osseous demineralization.
Levoconvex thoracolumbar scoliosis apex L2.
Multilevel disc space narrowing and endplate spur formation.
Multilevel facet degenerative changes.
No acute fracture, subluxation or bone destruction.
Extensive atherosclerotic calcifications.
No definite spondylolysis.
IMPRESSION: Advanced degenerative disc and facet disease changes of the lumbar
spine with associated thoracolumbar levoconvex scoliosis.
No definite acute abnormalities.

## 2013-02-06 DIAGNOSIS — Z23 Encounter for immunization: Secondary | ICD-10-CM | POA: Diagnosis not present

## 2013-02-06 DIAGNOSIS — L989 Disorder of the skin and subcutaneous tissue, unspecified: Secondary | ICD-10-CM | POA: Diagnosis not present

## 2013-02-06 DIAGNOSIS — I10 Essential (primary) hypertension: Secondary | ICD-10-CM | POA: Diagnosis not present

## 2013-02-06 DIAGNOSIS — E785 Hyperlipidemia, unspecified: Secondary | ICD-10-CM | POA: Diagnosis not present

## 2013-02-06 DIAGNOSIS — I6789 Other cerebrovascular disease: Secondary | ICD-10-CM | POA: Diagnosis not present

## 2013-02-24 ENCOUNTER — Emergency Department (HOSPITAL_COMMUNITY)
Admission: EM | Admit: 2013-02-24 | Discharge: 2013-02-24 | Disposition: A | Discharge status: Home / Self Care | Payer: Medicare Other | Attending: Emergency Medicine | Admitting: Emergency Medicine

## 2013-02-24 ENCOUNTER — Encounter (HOSPITAL_COMMUNITY): Payer: Self-pay | Admitting: Emergency Medicine

## 2013-02-24 DIAGNOSIS — Z7982 Long term (current) use of aspirin: Secondary | ICD-10-CM | POA: Insufficient documentation

## 2013-02-24 DIAGNOSIS — Y929 Unspecified place or not applicable: Secondary | ICD-10-CM | POA: Insufficient documentation

## 2013-02-24 DIAGNOSIS — Z79899 Other long term (current) drug therapy: Secondary | ICD-10-CM | POA: Diagnosis not present

## 2013-02-24 DIAGNOSIS — I10 Essential (primary) hypertension: Secondary | ICD-10-CM | POA: Insufficient documentation

## 2013-02-24 DIAGNOSIS — R296 Repeated falls: Secondary | ICD-10-CM | POA: Insufficient documentation

## 2013-02-24 DIAGNOSIS — H409 Unspecified glaucoma: Secondary | ICD-10-CM | POA: Insufficient documentation

## 2013-02-24 DIAGNOSIS — IMO0002 Reserved for concepts with insufficient information to code with codable children: Secondary | ICD-10-CM | POA: Insufficient documentation

## 2013-02-24 DIAGNOSIS — S81009A Unspecified open wound, unspecified knee, initial encounter: Secondary | ICD-10-CM | POA: Diagnosis not present

## 2013-02-24 DIAGNOSIS — Z8673 Personal history of transient ischemic attack (TIA), and cerebral infarction without residual deficits: Secondary | ICD-10-CM | POA: Insufficient documentation

## 2013-02-24 DIAGNOSIS — Z23 Encounter for immunization: Secondary | ICD-10-CM | POA: Diagnosis not present

## 2013-02-24 DIAGNOSIS — Y9389 Activity, other specified: Secondary | ICD-10-CM | POA: Insufficient documentation

## 2013-02-24 DIAGNOSIS — S81809A Unspecified open wound, unspecified lower leg, initial encounter: Secondary | ICD-10-CM | POA: Insufficient documentation

## 2013-02-24 DIAGNOSIS — S81812A Laceration without foreign body, left lower leg, initial encounter: Secondary | ICD-10-CM

## 2013-02-24 MED ORDER — BACITRACIN ZINC 500 UNIT/GM EX OINT: 1.0000 "application " | TOPICAL_OINTMENT | Freq: Two times a day (BID) | CUTANEOUS | Status: DC

## 2013-02-24 MED ORDER — LIDOCAINE-EPINEPHRINE (PF) 1 %-1:200000 IJ SOLN
INTRAMUSCULAR | Status: AC
  Administered 2013-02-24: 20:00:00
  Filled 2013-02-24: qty 10

## 2013-02-24 MED ORDER — BACITRACIN ZINC 500 UNIT/GM EX OINT
TOPICAL_OINTMENT | CUTANEOUS | Status: AC
  Administered 2013-02-24: 20:00:00
  Filled 2013-02-24: qty 0.9

## 2013-02-24 MED ORDER — TETANUS-DIPHTH-ACELL PERTUSSIS 5-2.5-18.5 LF-MCG/0.5 IM SUSP
0.5000 mL | Freq: Once | INTRAMUSCULAR | Status: AC
  Administered 2013-02-24: 0.5 mL via INTRAMUSCULAR
  Filled 2013-02-24: qty 0.5

## 2013-02-24 NOTE — ED Notes (Signed)
MD at bedside. 

## 2013-02-24 NOTE — ED Notes (Signed)
Pt states that she went to get up from her wheelchair to her walker when she felt that her left arm would not cooperate causing her to fall. Pt has skin tear to left upper arm and left lower leg that has been bandaged by home health nursing with pt. Pt reports that the time frame for left arm not cooperating was a few minutes and occurred at 10:00 this am,

## 2013-02-24 NOTE — ED Provider Notes (Signed)
CSN: [id number]     Arrival date & time 02/24/13  4098 History   First MD Initiated Contact with Patient 02/24/13 1845     Chief Complaint  Patient presents with  . Fall   (Consider location/radiation/quality/duration/timing/severity/associated sxs/prior Treatment) HPI Comments: 79 year old female - presents approximately 8 hours after having a fall while she was trying to mobilize to a standing position from her wheelchair to per walker. The caregiver states that she lost her balance as her left arm slipped causing her to fall sustaining a small abrasion to her left upper extremity as well as a laceration to the left lower extremity. Her mental status has otherwise been totally normal, there was no head injury, no neck pain and she has been able to move all 4 extremities without any weakness or discoordination since that time. The caregiver noted that the wound on the leg had been bleeding more this evening than it was earlier in the day even through her bandages and that she brought her for evaluation. Of note the patient does take both Plavix and aspirin. She has minimal pain to her leg, she has had no difficulty using her arms and no weakness in her arms. She has had a normal appetite and denies headache, blurred vision, neck pain or back pain. She is unsure of her last tetanus status  Patient is a 79 y.o. female presenting with fall. The history is provided by the patient.  Fall This is a new problem.    Past Medical History  Diagnosis Date  . Hypertension   . Blind left eye   . Glaucoma   . Hearing loss   . CVA (cerebral vascular accident)     speech problem   Past Surgical History  Procedure Laterality Date  . Shoulder surgery      RIGHT shoulder open treatment internal fixation with locking plate. Date of surgery November 25, 2010  . Orif humerus fracture  11/25/2010    Procedure: OPEN REDUCTION INTERNAL FIXATION (ORIF) PROXIMAL HUMERUS FRACTURE;  Surgeon: Fuller Canada, MD;   Location: [facility];  Service: Orthopedics;  Laterality: Right;  . Eye surgery      left   Family History  Problem Relation Age of Onset  . Heart disease     History  Substance Use Topics  . Smoking status: Never Smoker   . Smokeless tobacco: Not on file  . Alcohol Use: No   OB History   Grav Para Term Preterm Abortions TAB SAB Ect Mult Living                 Review of Systems  All other systems reviewed and are negative.    Allergies  Review of patient's allergies indicates no known allergies.  Home Medications   Current Outpatient Rx  Name  Route  Sig  Dispense  Refill  . alendronate (FOSAMAX) 70 MG tablet   Oral   Take 70 mg by mouth once a week.         Marland Kitchen amLODipine (NORVASC) 5 MG tablet   Oral   Take 5 mg by mouth daily.         Marland Kitchen aspirin EC 81 MG tablet   Oral   Take 81 mg by mouth daily.           . clopidogrel (PLAVIX) 75 MG tablet   Oral   Take 75 mg by mouth daily.           . furosemide (LASIX) 40 MG  tablet   Oral   Take 40 mg by mouth daily.         Marland Kitchen gabapentin (NEURONTIN) 100 MG capsule   Oral   Take 1 capsule (100 mg total) by mouth 3 (three) times daily.   90 capsule   5     Increase up to 3 tabs every 8 hours if needed   . losartan (COZAAR) 100 MG tablet   Oral   Take 100 mg by mouth daily.         . metoprolol tartrate (LOPRESSOR) 25 MG tablet   Oral   Take 25 mg by mouth 2 (two) times daily.           Marland Kitchen moxifloxacin (VIGAMOX) 0.5 % ophthalmic solution   Left Eye   Place 1 drop into the left eye 2 (two) times daily.          . timolol (TIMOPTIC) 0.25 % ophthalmic solution   Right Eye   Place 1 drop into the right eye 2 (two) times daily.          . bacitracin ointment   Topical   Apply 1 application topically 2 (two) times daily.   120 g   0   . PROAIR HFA 108 (90 BASE) MCG/ACT inhaler   Inhalation   Inhale 2 puffs into the lungs every 6 (six) hours as needed for wheezing or shortness of breath.            BP 143/57  Pulse 80  Temp(Src) 98 F (36.7 C) (Oral)  Resp 17  SpO2 96% Physical Exam  Nursing note and vitals reviewed. Constitutional: She appears well-developed and well-nourished. No distress.  HENT:  Head: Normocephalic and atraumatic.  Mouth/Throat: Oropharynx is clear and moist. No oropharyngeal exudate.  Eyes: Conjunctivae and EOM are normal. Pupils are equal, round, and reactive to light. Right eye exhibits no discharge. Left eye exhibits no discharge. No scleral icterus.  Neck: Normal range of motion. Neck supple. No JVD present. No thyromegaly present.  Cardiovascular: Normal rate, regular rhythm, normal heart sounds and intact distal pulses.  Exam reveals no gallop and no friction rub.   No murmur heard. Pulmonary/Chest: Effort normal and breath sounds normal. No respiratory distress. She has no wheezes. She has no rales.  Abdominal: Soft. Bowel sounds are normal. She exhibits no distension and no mass. There is no tenderness.  Musculoskeletal: Normal range of motion. She exhibits tenderness ( Tenderness located over the laceration of the left lower extremity, lateral compartment between the knee and ankle.). She exhibits no edema.  No deformity of the 4 extremities, abrasion to the left upper extremity and laceration to the left lower extremity noted, supple joints, soft compartments  Lymphadenopathy:    She has no cervical adenopathy.  Neurological: She is alert. Coordination normal.  Baseline speech, difficulty with expressive aphasia, caregiver says this has been this way since March, patient states she has had this since her stroke. Otherwise no focal weakness, no correlation abnormalities or ataxia, can straight leg raise bilaterally without difficulty  Skin: Skin is warm and dry. No rash noted. No erythema.  V. shaped laceration to the left lower extremity, approximately 8 cm in total length  Psychiatric: She has a normal mood and affect. Her behavior is normal.     ED Course  Procedures (including critical care time) Labs Review Labs Reviewed - No data to display Imaging Review No results found.  EKG Interpretation   None  MDM   1. Laceration of left lower extremity, initial encounter     It does not appear that the patient's symptoms occurred as a result of left arm weakness due to stroke or other neurologic event but more related to loss of control with regards to position and coordination while getting up from the sitting position. She has had no further difficulty using the arm since this morning.  The patient has an isolated laceration to the left lower extremity requiring repair, tetanus needs to be updated, unfortunately the patient's wound had a very thin flap which was avascular and was not amenable to suture retention. 4 sutures were placed, the patient and caregiver were informed that she would need close followup for wound care and inspection as there was not complete coverage of the wound. The wound was hemostatic without any bleeding on discharge, the patient tolerated the procedure without any pain.  LACERATION REPAIR Performed by: Vida Roller Authorized by: Vida Roller Consent: Verbal consent obtained. Risks and benefits: risks, benefits and alternatives were discussed Consent given by: patient Patient identity confirmed: provided demographic data Prepped and Draped in normal sterile fashion Wound explored  Laceration Location: L lower extremity  Laceration Length: 8 cm  No Foreign Bodies seen or palpated  Anesthesia: local infiltration  Local anesthetic: lidocaine 2% with epinephrine  Anesthetic total: 8 ml  Irrigation method: syringe Amount of cleaning: standard  Skin closure: 4-0 prolene  Number of sutures: 4  Technique: Simple interrupted and horizontal mattress  Patient tolerance: Patient tolerated the procedure well with no immediate complications.   Meds given in ED:  Medications   lidocaine-EPINEPHrine (XYLOCAINE-EPINEPHrine) 1 %-1:200000 (with pres) injection (not administered)  bacitracin 500 UNIT/GM ointment (not administered)  Tdap (BOOSTRIX) injection 0.5 mL (not administered)    New Prescriptions   BACITRACIN OINTMENT    Apply 1 application topically 2 (two) times daily.      Vida Roller, MD 02/24/13 [phone]

## 2013-02-24 NOTE — ED Notes (Signed)
MD at bedside suturing flap laceration.

## 2013-02-26 DIAGNOSIS — S81009A Unspecified open wound, unspecified knee, initial encounter: Secondary | ICD-10-CM | POA: Diagnosis not present

## 2013-03-05 DIAGNOSIS — S8010XA Contusion of unspecified lower leg, initial encounter: Secondary | ICD-10-CM | POA: Diagnosis not present

## 2013-03-08 DIAGNOSIS — S81009A Unspecified open wound, unspecified knee, initial encounter: Secondary | ICD-10-CM | POA: Diagnosis not present

## 2013-03-12 DIAGNOSIS — S81009A Unspecified open wound, unspecified knee, initial encounter: Secondary | ICD-10-CM | POA: Diagnosis not present

## 2013-04-17 DIAGNOSIS — S81809A Unspecified open wound, unspecified lower leg, initial encounter: Secondary | ICD-10-CM | POA: Diagnosis not present

## 2013-04-17 DIAGNOSIS — S81009A Unspecified open wound, unspecified knee, initial encounter: Secondary | ICD-10-CM | POA: Diagnosis not present

## 2013-04-17 DIAGNOSIS — I10 Essential (primary) hypertension: Secondary | ICD-10-CM | POA: Diagnosis not present

## 2013-04-17 DIAGNOSIS — E785 Hyperlipidemia, unspecified: Secondary | ICD-10-CM | POA: Diagnosis not present

## 2013-04-17 DIAGNOSIS — I6789 Other cerebrovascular disease: Secondary | ICD-10-CM | POA: Diagnosis not present

## 2013-06-08 DIAGNOSIS — H4011X0 Primary open-angle glaucoma, stage unspecified: Secondary | ICD-10-CM | POA: Diagnosis not present

## 2013-06-08 DIAGNOSIS — H1820 Unspecified corneal edema: Secondary | ICD-10-CM | POA: Diagnosis not present

## 2013-06-08 DIAGNOSIS — H409 Unspecified glaucoma: Secondary | ICD-10-CM | POA: Diagnosis not present

## 2013-07-02 DIAGNOSIS — I10 Essential (primary) hypertension: Secondary | ICD-10-CM | POA: Diagnosis not present

## 2013-07-02 DIAGNOSIS — E785 Hyperlipidemia, unspecified: Secondary | ICD-10-CM | POA: Diagnosis not present

## 2013-07-02 DIAGNOSIS — L989 Disorder of the skin and subcutaneous tissue, unspecified: Secondary | ICD-10-CM | POA: Diagnosis not present

## 2013-07-02 DIAGNOSIS — H409 Unspecified glaucoma: Secondary | ICD-10-CM | POA: Diagnosis not present

## 2013-07-06 DIAGNOSIS — I10 Essential (primary) hypertension: Secondary | ICD-10-CM | POA: Diagnosis not present

## 2013-07-06 DIAGNOSIS — E785 Hyperlipidemia, unspecified: Secondary | ICD-10-CM | POA: Diagnosis not present

## 2013-07-06 DIAGNOSIS — I6789 Other cerebrovascular disease: Secondary | ICD-10-CM | POA: Diagnosis not present

## 2013-07-17 ENCOUNTER — Other Ambulatory Visit: Payer: Self-pay | Admitting: *Deleted

## 2013-07-17 DIAGNOSIS — M5430 Sciatica, unspecified side: Secondary | ICD-10-CM

## 2013-07-17 MED ORDER — GABAPENTIN 100 MG PO CAPS: 100.0000 mg | ORAL_CAPSULE | Freq: Three times a day (TID) | ORAL | Status: DC

## 2013-10-02 DIAGNOSIS — H409 Unspecified glaucoma: Secondary | ICD-10-CM | POA: Diagnosis not present

## 2013-10-02 DIAGNOSIS — E785 Hyperlipidemia, unspecified: Secondary | ICD-10-CM | POA: Diagnosis not present

## 2013-10-02 DIAGNOSIS — I10 Essential (primary) hypertension: Secondary | ICD-10-CM | POA: Diagnosis not present

## 2013-10-02 DIAGNOSIS — I6789 Other cerebrovascular disease: Secondary | ICD-10-CM | POA: Diagnosis not present

## 2013-10-07 ENCOUNTER — Encounter (HOSPITAL_COMMUNITY): Payer: Self-pay | Admitting: Emergency Medicine

## 2013-10-07 ENCOUNTER — Inpatient Hospital Stay (HOSPITAL_COMMUNITY)
Admission: EM | Admit: 2013-10-07 | Discharge: 2013-10-09 | DRG: 312 | Disposition: A | Discharge status: Home Health | Payer: Medicare Other | Attending: Pulmonary Disease | Admitting: Pulmonary Disease

## 2013-10-07 ENCOUNTER — Emergency Department (HOSPITAL_COMMUNITY): Payer: Medicare Other

## 2013-10-07 DIAGNOSIS — I69998 Other sequelae following unspecified cerebrovascular disease: Secondary | ICD-10-CM | POA: Diagnosis not present

## 2013-10-07 DIAGNOSIS — H409 Unspecified glaucoma: Secondary | ICD-10-CM | POA: Diagnosis present

## 2013-10-07 DIAGNOSIS — S40029A Contusion of unspecified upper arm, initial encounter: Secondary | ICD-10-CM | POA: Diagnosis present

## 2013-10-07 DIAGNOSIS — Z9181 History of falling: Secondary | ICD-10-CM

## 2013-10-07 DIAGNOSIS — S298XXA Other specified injuries of thorax, initial encounter: Secondary | ICD-10-CM | POA: Diagnosis not present

## 2013-10-07 DIAGNOSIS — R404 Transient alteration of awareness: Secondary | ICD-10-CM | POA: Diagnosis not present

## 2013-10-07 DIAGNOSIS — R55 Syncope and collapse: Principal | ICD-10-CM

## 2013-10-07 DIAGNOSIS — S0990XA Unspecified injury of head, initial encounter: Secondary | ICD-10-CM | POA: Diagnosis not present

## 2013-10-07 DIAGNOSIS — S8010XA Contusion of unspecified lower leg, initial encounter: Secondary | ICD-10-CM | POA: Diagnosis present

## 2013-10-07 DIAGNOSIS — M810 Age-related osteoporosis without current pathological fracture: Secondary | ICD-10-CM | POA: Diagnosis present

## 2013-10-07 DIAGNOSIS — W010XXA Fall on same level from slipping, tripping and stumbling without subsequent striking against object, initial encounter: Secondary | ICD-10-CM | POA: Diagnosis present

## 2013-10-07 DIAGNOSIS — I10 Essential (primary) hypertension: Secondary | ICD-10-CM | POA: Diagnosis present

## 2013-10-07 DIAGNOSIS — E785 Hyperlipidemia, unspecified: Secondary | ICD-10-CM | POA: Diagnosis present

## 2013-10-07 DIAGNOSIS — M48061 Spinal stenosis, lumbar region without neurogenic claudication: Secondary | ICD-10-CM | POA: Diagnosis present

## 2013-10-07 DIAGNOSIS — Y92009 Unspecified place in unspecified non-institutional (private) residence as the place of occurrence of the external cause: Secondary | ICD-10-CM

## 2013-10-07 DIAGNOSIS — Z8673 Personal history of transient ischemic attack (TIA), and cerebral infarction without residual deficits: Secondary | ICD-10-CM | POA: Diagnosis not present

## 2013-10-07 DIAGNOSIS — I379 Nonrheumatic pulmonary valve disorder, unspecified: Secondary | ICD-10-CM | POA: Diagnosis not present

## 2013-10-07 DIAGNOSIS — I658 Occlusion and stenosis of other precerebral arteries: Secondary | ICD-10-CM | POA: Diagnosis not present

## 2013-10-07 DIAGNOSIS — R269 Unspecified abnormalities of gait and mobility: Secondary | ICD-10-CM | POA: Diagnosis not present

## 2013-10-07 DIAGNOSIS — T1490XA Injury, unspecified, initial encounter: Secondary | ICD-10-CM | POA: Diagnosis not present

## 2013-10-07 DIAGNOSIS — H5440 Blindness, one eye, unspecified eye: Secondary | ICD-10-CM | POA: Diagnosis present

## 2013-10-07 DIAGNOSIS — H9190 Unspecified hearing loss, unspecified ear: Secondary | ICD-10-CM | POA: Diagnosis present

## 2013-10-07 LAB — CBC WITH DIFFERENTIAL/PLATELET
Basophils Absolute: 0.1 10*3/uL (ref 0.0–0.1)
Basophils Relative: 1 % (ref 0–1)
EOS ABS: 0.3 10*3/uL (ref 0.0–0.7)
EOS PCT: 3 % (ref 0–5)
HCT: 31.8 % — ABNORMAL LOW (ref 36.0–46.0)
HEMOGLOBIN: 10.5 g/dL — AB (ref 12.0–15.0)
Lymphocytes Relative: 20 % (ref 12–46)
Lymphs Abs: 2.1 10*3/uL (ref 0.7–4.0)
MCH: 31.7 pg (ref 26.0–34.0)
MCHC: 33 g/dL (ref 30.0–36.0)
MCV: 96.1 fL (ref 78.0–100.0)
MONOS PCT: 8 % (ref 3–12)
Monocytes Absolute: 0.9 10*3/uL (ref 0.1–1.0)
NEUTROS PCT: 68 % (ref 43–77)
Neutro Abs: 7.1 10*3/uL (ref 1.7–7.7)
Platelets: 312 10*3/uL (ref 150–400)
RBC: 3.31 MIL/uL — AB (ref 3.87–5.11)
RDW: 14.1 % (ref 11.5–15.5)
WBC: 10.4 10*3/uL (ref 4.0–10.5)

## 2013-10-07 LAB — URINALYSIS, ROUTINE W REFLEX MICROSCOPIC
Bilirubin Urine: NEGATIVE
GLUCOSE, UA: NEGATIVE mg/dL
HGB URINE DIPSTICK: NEGATIVE
KETONES UR: NEGATIVE mg/dL
LEUKOCYTES UA: NEGATIVE
Nitrite: NEGATIVE
PH: 6 (ref 5.0–8.0)
PROTEIN: NEGATIVE mg/dL
Specific Gravity, Urine: 1.01 (ref 1.005–1.030)
Urobilinogen, UA: 0.2 mg/dL (ref 0.0–1.0)

## 2013-10-07 LAB — BASIC METABOLIC PANEL
Anion gap: 12 (ref 5–15)
BUN: 35 mg/dL — AB (ref 6–23)
CO2: 27 mEq/L (ref 19–32)
Calcium: 8.8 mg/dL (ref 8.4–10.5)
Chloride: 100 mEq/L (ref 96–112)
Creatinine, Ser: 1.4 mg/dL — ABNORMAL HIGH (ref 0.50–1.10)
GFR calc Af Amer: 37 mL/min — ABNORMAL LOW (ref >90)
GFR, EST NON AFRICAN AMERICAN: 32 mL/min — AB (ref >90)
Glucose, Bld: 146 mg/dL — ABNORMAL HIGH (ref 70–99)
POTASSIUM: 4 meq/L (ref 3.7–5.3)
Sodium: 139 mEq/L (ref 137–147)

## 2013-10-07 LAB — TROPONIN I: Troponin I: <0.3 ng/mL (ref <0.30)

## 2013-10-07 MED ORDER — GABAPENTIN 100 MG PO CAPS
100.0000 mg | ORAL_CAPSULE | Freq: Three times a day (TID) | ORAL | Status: DC
  Administered 2013-10-08 – 2013-10-09 (×6): 100 mg via ORAL
  Filled 2013-10-07 (×12): qty 1

## 2013-10-07 MED ORDER — ALENDRONATE SODIUM 70 MG PO TABS: 70.0000 mg | ORAL_TABLET | ORAL | Status: DC

## 2013-10-07 MED ORDER — FUROSEMIDE 40 MG PO TABS
40.0000 mg | ORAL_TABLET | Freq: Every day | ORAL | Status: DC
  Administered 2013-10-08 – 2013-10-09 (×2): 40 mg via ORAL
  Filled 2013-10-07 (×2): qty 1

## 2013-10-07 MED ORDER — ASPIRIN EC 81 MG PO TBEC
81.0000 mg | DELAYED_RELEASE_TABLET | Freq: Every day | ORAL | Status: DC
  Administered 2013-10-08 – 2013-10-09 (×2): 81 mg via ORAL
  Filled 2013-10-07 (×5): qty 1

## 2013-10-07 MED ORDER — GATIFLOXACIN 0.5 % OP SOLN
1.0000 [drp] | Freq: Four times a day (QID) | OPHTHALMIC | Status: DC
Start: 2013-10-07 — End: 2013-10-09
  Administered 2013-10-08 – 2013-10-09 (×7): 1 [drp] via OPHTHALMIC
  Filled 2013-10-07: qty 2.5

## 2013-10-07 MED ORDER — TIMOLOL MALEATE 0.25 % OP SOLN
1.0000 [drp] | Freq: Two times a day (BID) | OPHTHALMIC | Status: DC
  Administered 2013-10-08 – 2013-10-09 (×4): 1 [drp] via OPHTHALMIC
  Filled 2013-10-07 (×2): qty 5

## 2013-10-07 MED ORDER — LOSARTAN POTASSIUM 50 MG PO TABS
100.0000 mg | ORAL_TABLET | Freq: Every day | ORAL | Status: DC
  Administered 2013-10-08 – 2013-10-09 (×2): 100 mg via ORAL
  Filled 2013-10-07 (×4): qty 2

## 2013-10-07 MED ORDER — CLOPIDOGREL BISULFATE 75 MG PO TABS
75.0000 mg | ORAL_TABLET | Freq: Every day | ORAL | Status: DC
  Administered 2013-10-08 – 2013-10-09 (×2): 75 mg via ORAL
  Filled 2013-10-07 (×2): qty 1

## 2013-10-07 MED ORDER — AMLODIPINE BESYLATE 5 MG PO TABS
5.0000 mg | ORAL_TABLET | Freq: Every day | ORAL | Status: DC
  Administered 2013-10-08 – 2013-10-09 (×2): 5 mg via ORAL
  Filled 2013-10-07 (×2): qty 1

## 2013-10-07 MED ORDER — ALBUTEROL SULFATE HFA 108 (90 BASE) MCG/ACT IN AERS
2.0000 | INHALATION_SPRAY | Freq: Four times a day (QID) | RESPIRATORY_TRACT | Status: DC | PRN
  Filled 2013-10-07: qty 6.7

## 2013-10-07 MED ORDER — METOPROLOL TARTRATE 25 MG PO TABS
25.0000 mg | ORAL_TABLET | Freq: Two times a day (BID) | ORAL | Status: DC
  Administered 2013-10-08 – 2013-10-09 (×4): 25 mg via ORAL
  Filled 2013-10-07 (×4): qty 1

## 2013-10-07 MED ORDER — TETANUS-DIPHTH-ACELL PERTUSSIS 5-2.5-18.5 LF-MCG/0.5 IM SUSP
0.5000 mL | Freq: Once | INTRAMUSCULAR | Status: AC
  Administered 2013-10-07: 0.5 mL via INTRAMUSCULAR
  Filled 2013-10-07: qty 0.5

## 2013-10-07 MED ORDER — GATIFLOXACIN 0.5 % OP SOLN
OPHTHALMIC | Status: AC
  Filled 2013-10-07: qty 2.5

## 2013-10-07 NOTE — Progress Notes (Signed)
PHARMACIST - PHYSICIAN COMMUNICATION  CONCERNING: P&T Medication Policy Regarding Oral Bisphosphonates  RECOMMENDATION: Your order for alendronate (Fosamax), ibandronate (Boniva), or risedronate (Actonel) has been discontinued at this time.  If the patient's post-hospital medical condition warrants safe use of this class of drugs, please resume the pre-hospital regimen upon discharge.  DESCRIPTION:  Alendronate (Fosamax), ibandronate (Boniva), and risedronate (Actonel) can cause severe esophageal erosions in patients who are unable to remain upright at least 30 minutes after taking this medication.   Since brief interruptions in therapy are thought to have minimal impact on bone mineral density, the Pharmacy & Therapeutics Committee has established that bisphosphonate orders should be routinely discontinued during hospitalization.   To override this safety policy and permit administration of Boniva, Fosamax, or Actonel in the hospital, prescribers must write "DO NOT HOLD" in the comments section when placing the order for this class of medications.  Junita PushMichelle Antonyo Hinderer, PharmD, BCPS 10/07/2013@5 :33 PM

## 2013-10-07 NOTE — H&P (Signed)
Carla MajorJean M Bonilla MRN: [id number] DOB/AGE: 1920-05-25 80 y.o. Primary Care Physician:HAWKINS,EDWARD L, MD Admit date: 10/07/2013 Chief Complaint: fall HPI: This is a 80 years old female with history of multiple medical illnesses was brought ER due to fall. Patient fell at home while coming out of her tube. EMS was called and assisted her to get up. However, patient passed out later for about a minute and it was witnessed by EMS. She had no seizure activity or chest pain. She has evaluated in ER and was found to have bruise and few skin tear in her lower extremities. No Bonilla injury. Patient is being admitted for further evaluation. No headache, fever, chills, cough, chest pain, shortness of breath, nausea, vomiting, abdominal pain, dysuria, urgency or frequency of urination   Past Medical History  Diagnosis Date  . Hypertension   . Blind left eye   . Glaucoma   . Hearing loss   . CVA (cerebral vascular accident)     speech problem   Past Surgical History  Procedure Laterality Date  . Shoulder surgery      RIGHT shoulder open treatment internal fixation with locking plate. Date of surgery November 25, 2010  . Orif humerus fracture  11/25/2010    Procedure: OPEN REDUCTION INTERNAL FIXATION (ORIF) PROXIMAL HUMERUS FRACTURE;  Surgeon: Fuller CanadaStanley Harrison, MD;  Location: [facility];  Service: Orthopedics;  Laterality: Right;  . Eye surgery      left       Family History  Problem Relation Age of Onset  . Heart disease     Social History:  reports that she has never smoked. She does not have any smokeless tobacco history on file. She reports that she does not drink alcohol or use illicit drugs.   Allergies: No Known Allergies   (Not in a hospital admission)     NWG:NFAOZROS:apart from the symptoms mentioned above,there are no other symptoms referable to all systems reviewed.  Physical Exam: Blood pressure 111/59, pulse 61, temperature 97.5 F (36.4 C), resp. rate 16, height 5\' 3"  (1.6 m),  weight 63.504 kg (140 lb), SpO2 98.00%.  General Condition: Alert, awake, not in distress HE ENT- small hematoma and bruise on the forehead, neck supple Respiratory- clear lung field CVS- S1 and S2 heard, no murmur ABD- soft and lax bowel sound++ EXT- multiple bruises  and dressed wound on the rt leg    Recent Labs  10/07/13 1155  WBC 10.4  NEUTROABS 7.1  HGB 10.5*  HCT 31.8*  MCV 96.1  PLT 312    Recent Labs  10/07/13 1155  NA 139  K 4.0  CL 100  CO2 27  GLUCOSE 146*  BUN 35*  CREATININE 1.40*  CALCIUM 8.8  lablast2(ast:2,ALT:2,alkphos:2,bilitot:2,prot:2,albumin:2)@    No results found for this or any previous visit (from the past 240 hour(s)).   Ct Head Wo Contrast  10/07/2013   CLINICAL DATA:  Syncope.  Hit back of head.  EXAM: CT HEAD WITHOUT CONTRAST  TECHNIQUE: Contiguous axial images were obtained from the base of the skull through the vertex without intravenous contrast.  COMPARISON:  11/20/2010  FINDINGS: Ventricles are normal in configuration. There is ventricular and sulcal enlargement reflecting moderate atrophy. There are old infarcts, 1 along the left lateral frontal lobe another involving the right parietal lobe, stable. Other patchy areas of white matter hypoattenuation noted consistent with moderate chronic microvascular ischemic change. An old white matter lacune infarct is noted in the posterior right frontal lobe.  There are no  parenchymal masses or mass effect. No evidence of a recent cortical infarct.  There are no extra-axial masses or abnormal fluid collections.  No intracranial hemorrhage.  No skull fracture. Small fluid level in the posterior left maxillary sinus. Sinuses otherwise essentially clear. Clear mastoid air cells.  IMPRESSION: 1. No acute intracranial abnormalities. 2. Atrophy, chronic microvascular ischemic change and old infarcts.   Electronically Signed   By: Amie Portland M.D.   On: 10/07/2013 15:23   Dg Chest Portable 1  View  10/07/2013   CLINICAL DATA:  Arm pain.  Fell today.  EXAM: PORTABLE CHEST - 1 VIEW  COMPARISON:  11/23/2010  FINDINGS: Cardiac silhouette is normal in size. Normal mediastinal and hilar contours.  Clear lungs.  No pleural effusion or pneumothorax.  Old fracture of the right humerus has been reduced with external fixation. It is healed.  IMPRESSION: No acute cardiopulmonary disease   Electronically Signed   By: Amie Portland M.D.   On: 10/07/2013 12:13   Impresiion:  Active Problems:   Syncope Accidental fall H/o CVA   Plan: Admit to telemetry Will do carotid doppler, echo, and EEG Continue home medication Supportive care       [name]   10/07/2013, 5:19 PM

## 2013-10-07 NOTE — ED Provider Notes (Signed)
CSN: [id number]     Arrival date & time 10/07/13  1151 History  This chart was scribed for Carla Quarry, MD by [place] Peace, ED Scribe. The patient was seen in [facility]. The patient's care was started at 12:12 PM.    Chief Complaint  Patient presents with  . Fall      The history is provided by the patient and a friend. No language interpreter was used.   HPI Comments: Carla Bonilla is a 80 y.o. female who arrived to the ED via EMS presents to the Emergency Department complaining of fall onset 10:30 this morning that occurred when she was getting out of the bathtub and slipped on the floor causing her to fall and sustain some lacerations and bruising to her right arm and right leg. Pt's neighbor states that she experienced a syncopal episode and associated vomiting when the EMS arrived at her house. Pt has slurred speech as well as left sided facial droop and left sided weakness due to a stroke that she suffered a few years ago. Pt reports that she bruises very easily which is evident when examining her body. Pt's neighbors report that they check up on her frequently along with a caregiver that lives with her. Pt denies that anyone was with her when she fell. She further denies any dizziness or headaches. Pt also denies any past experiences of LOC prior to today. Her PCP is Dr. Juanetta Gosling.  Past Medical History  Diagnosis Date  . Hypertension   . Blind left eye   . Glaucoma   . Hearing loss   . CVA (cerebral vascular accident)     speech problem   Past Surgical History  Procedure Laterality Date  . Shoulder surgery      RIGHT shoulder open treatment internal fixation with locking plate. Date of surgery November 25, 2010  . Orif humerus fracture  11/25/2010    Procedure: OPEN REDUCTION INTERNAL FIXATION (ORIF) PROXIMAL HUMERUS FRACTURE;  Surgeon: Fuller Canada, MD;  Location: [facility];  Service: Orthopedics;  Laterality: Right;  . Eye surgery      left   Family History  Problem  Relation Age of Onset  . Heart disease     History  Substance Use Topics  . Smoking status: Never Smoker   . Smokeless tobacco: Not on file  . Alcohol Use: No   OB History   Grav Para Term Preterm Abortions TAB SAB Ect Mult Living                 Review of Systems  Gastrointestinal: Positive for vomiting.  Skin: Positive for wound (R arm and R leg. ).  Neurological: Positive for syncope. Negative for dizziness and headaches.  Hematological: Bruises/bleeds easily.      Allergies  Review of patient's allergies indicates no known allergies.  Home Medications   Prior to Admission medications   Medication Sig Start Date End Date Taking? Authorizing Provider  alendronate (FOSAMAX) 70 MG tablet Take 70 mg by mouth once a week. 01/29/13   Historical Provider, MD  amLODipine (NORVASC) 5 MG tablet Take 5 mg by mouth daily.    Historical Provider, MD  aspirin EC 81 MG tablet Take 81 mg by mouth daily.      Historical Provider, MD  bacitracin ointment Apply 1 application topically 2 (two) times daily. 02/24/13   Vida Roller, MD  clopidogrel (PLAVIX) 75 MG tablet Take 75 mg by mouth daily.      Historical  Provider, MD  furosemide (LASIX) 40 MG tablet Take 40 mg by mouth daily. 01/29/13   Historical Provider, MD  gabapentin (NEURONTIN) 100 MG capsule Take 1 capsule (100 mg total) by mouth 3 (three) times daily. 07/17/13   Vickki Hearing, MD  losartan (COZAAR) 100 MG tablet Take 100 mg by mouth daily. 01/29/13   Historical Provider, MD  metoprolol tartrate (LOPRESSOR) 25 MG tablet Take 25 mg by mouth 2 (two) times daily.      Historical Provider, MD  moxifloxacin (VIGAMOX) 0.5 % ophthalmic solution Place 1 drop into the left eye 2 (two) times daily.     Historical Provider, MD  PROAIR HFA 108 (90 BASE) MCG/ACT inhaler Inhale 2 puffs into the lungs every 6 (six) hours as needed for wheezing or shortness of breath.  01/04/13   Historical Provider, MD  timolol (TIMOPTIC) 0.25 % ophthalmic  solution Place 1 drop into the right eye 2 (two) times daily.     Historical Provider, MD   BP 115/58  Pulse 65  Temp(Src) 97.5 F (36.4 C)  Resp 19  Ht 5\' 3"  (1.6 m)  Wt 140 lb (63.504 kg)  BMI 24.81 kg/m2  SpO2 94% Physical Exam  Nursing note and vitals reviewed. Constitutional: She is oriented to person, place, and time. She appears well-developed and well-nourished. No distress.  HENT:  Head: Normocephalic and atraumatic.  Eyes: Conjunctivae and EOM are normal.  Neck: Neck supple. No tracheal deviation present.  Cardiovascular: Normal rate.   Pulmonary/Chest: Effort normal. No respiratory distress.  Musculoskeletal: Normal range of motion.  Neurological: She is alert and oriented to person, place, and time.  Skin: Skin is warm and dry.  Psychiatric: She has a normal mood and affect. Her behavior is normal.    ED Course  Procedures (including critical care time) DIAGNOSTIC STUDIES: Oxygen Saturation is 94% on room air, adequate by my interpretation.    COORDINATION OF CARE: 12:18 PM- Treatment plan was discussed with patient who verbalizes understanding and agrees.     Labs Review Labs Reviewed  CBC WITH DIFFERENTIAL - Abnormal; Notable for the following:    RBC 3.31 (*)    Hemoglobin 10.5 (*)    HCT 31.8 (*)    All other components within normal limits  BASIC METABOLIC PANEL - Abnormal; Notable for the following:    Glucose, Bld 146 (*)    BUN 35 (*)    Creatinine, Ser 1.40 (*)    GFR calc non Af Amer 32 (*)    GFR calc Af Amer 37 (*)    All other components within normal limits  CBC - Abnormal; Notable for the following:    RBC 2.96 (*)    Hemoglobin 9.0 (*)    HCT 27.9 (*)    All other components within normal limits  BASIC METABOLIC PANEL - Abnormal; Notable for the following:    Glucose, Bld 103 (*)    BUN 28 (*)    Creatinine, Ser 1.26 (*)    GFR calc non Af Amer 36 (*)    GFR calc Af Amer 42 (*)    All other components within normal limits   TROPONIN I  URINALYSIS, ROUTINE W REFLEX MICROSCOPIC    Imaging Review Ct Head Wo Contrast  10/07/2013   CLINICAL DATA:  Syncope.  Hit back of head.  EXAM: CT HEAD WITHOUT CONTRAST  TECHNIQUE: Contiguous axial images were obtained from the base of the skull through the vertex without intravenous contrast.  COMPARISON:  11/20/2010  FINDINGS: Ventricles are normal in configuration. There is ventricular and sulcal enlargement reflecting moderate atrophy. There are old infarcts, 1 along the left lateral frontal lobe another involving the right parietal lobe, stable. Other patchy areas of white matter hypoattenuation noted consistent with moderate chronic microvascular ischemic change. An old white matter lacune infarct is noted in the posterior right frontal lobe.  There are no parenchymal masses or mass effect. No evidence of a recent cortical infarct.  There are no extra-axial masses or abnormal fluid collections.  No intracranial hemorrhage.  No skull fracture. Small fluid level in the posterior left maxillary sinus. Sinuses otherwise essentially clear. Clear mastoid air cells.  IMPRESSION: 1. No acute intracranial abnormalities. 2. Atrophy, chronic microvascular ischemic change and old infarcts.   Electronically Signed   By: Amie Portlandavid  Ormond M.D.   On: 10/07/2013 15:23   [place] Carotid Bilateral  10/08/2013   CLINICAL DATA:  Syncope  EXAM: BILATERAL CAROTID DUPLEX ULTRASOUND  TECHNIQUE: Wallace CullensGray scale imaging, color Doppler and duplex ultrasound were performed of bilateral carotid and vertebral arteries in the neck.  COMPARISON:  None.  FINDINGS: Criteria: Quantification of carotid stenosis is based on velocity parameters that correlate the residual internal carotid diameter with NASCET-based stenosis levels, using the diameter of the distal internal carotid lumen as the denominator for stenosis measurement.  The following velocity measurements were obtained:  RIGHT  ICA:  133 cm/sec  CCA:  84 cm/sec  SYSTOLIC  ICA/CCA RATIO:  1.6  DIASTOLIC ICA/CCA RATIO:  ECA:  183 cm/sec  LEFT  ICA:  102 cm/sec  CCA:  75 cm/sec  SYSTOLIC ICA/CCA RATIO:  1.4  DIASTOLIC ICA/CCA RATIO:  ECA:  97 cm/sec  RIGHT CAROTID ARTERY: Moderate calcified plaque in the bulb. Low resistance internal carotid Doppler pattern.  RIGHT VERTEBRAL ARTERY:  Antegrade.  Low resistance Doppler pattern.  LEFT CAROTID ARTERY: Mild plaque in the bulb. Low resistance internal carotid Doppler pattern.  LEFT VERTEBRAL ARTERY:  Antegrade.  Normal Doppler pattern.  IMPRESSION: 50-69% stenosis in the right internal carotid artery.  Less than 50% stenosis in the left internal carotid artery.   Electronically Signed   By: Maryclare BeanArt  Hoss M.D.   On: 10/08/2013 10:29   Dg Chest Portable 1 View  10/07/2013   CLINICAL DATA:  Arm pain.  Fell today.  EXAM: PORTABLE CHEST - 1 VIEW  COMPARISON:  11/23/2010  FINDINGS: Cardiac silhouette is normal in size. Normal mediastinal and hilar contours.  Clear lungs.  No pleural effusion or pneumothorax.  Old fracture of the right humerus has been reduced with external fixation. It is healed.  IMPRESSION: No acute cardiopulmonary disease   Electronically Signed   By: Amie Portlandavid  Ormond M.D.   On: 10/07/2013 12:13     EKG Interpretation   Date/Time:  Sunday October 07 2013 11:52:25 EDT Ventricular Rate:  64 PR Interval:  160 QRS Duration: 99 QT Interval:  399 QTC Calculation: 412 R Axis:   64 Text Interpretation:  Normal sinus rhythm Normal ECG No significant change  was found from first prior ekg of 11/25/2010 Confirmed by Izea Livolsi MD, Duwayne HeckANIELLE  [phone]([id number]) on 10/07/2013 11:56:57 AM     Medications - No data to display  MDM   Final diagnoses:  Syncope, unspecified syncope type  multiple contusions  Discussed with Dr.Fanta and will admit for Dr. Juanetta GoslingHawkins.   I personally performed the services described in this documentation, which was scribed in my presence. The recorded information has been reviewed and considered.  Carla Quarry,  MD 10/08/13 1501

## 2013-10-07 NOTE — Progress Notes (Signed)
ANTICOAGULATION CONSULT NOTE - Initial Consult  Pharmacy Consult for Lovenox Indication: VTE prophylaxis  No Known Allergies  Patient Measurements: Height: 5\' 3"  (160 cm) Weight: 140 lb (63.504 kg) IBW/kg (Calculated) : 52.4  Vital Signs: Temp: 97.5 F (36.4 C) (07/19 1152) BP: 111/59 mmHg (07/19 1630) Pulse Rate: 61 (07/19 1630)  Labs:  Recent Labs  10/07/13 1155  HGB 10.5*  HCT 31.8*  PLT 312  CREATININE 1.40*  TROPONINI <0.30    Estimated Creatinine Clearance: 23.9 ml/min (by C-G formula based on Cr of 1.4).   Medical History: Past Medical History  Diagnosis Date  . Hypertension   . Blind left eye   . Glaucoma   . Hearing loss   . CVA (cerebral vascular accident)     speech problem    Medications:  Scheduled:  . amLODipine  5 mg Oral Daily  . [START ON 10/08/2013] aspirin EC  81 mg Oral Daily  . clopidogrel  75 mg Oral Daily  . furosemide  40 mg Oral Daily  . gabapentin  100 mg Oral TID  . gatifloxacin  1 drop Both Eyes QID  . [START ON 10/08/2013] losartan  100 mg Oral Daily  . metoprolol tartrate  25 mg Oral BID  . timolol  1 drop Right Eye BID    Assessment: 89 yoF admitted s/p fall. CBC reviewed.  No bleeding noted.  Scr elevated above patient's baseline.  Estimated CrCl ~ 20-3825ml/min.   Goal of Therapy:  Monitor platelets by anticoagulation protocol: Yes   Plan:  Lovenox 30mg  sq daily Monitor renal fxn & CBC  Carla Bonilla, Mercy RidingAndrea Michelle 10/07/2013,5:33 PM

## 2013-10-07 NOTE — ED Notes (Signed)
Per EMS-pt fell at home today-lacerations to rle and right arm. Upon EMS arrival pt had syncopal episode while on toilet with 1 episode n/v. Pt a and o x 4 upon arrival to ED. Pt has residual slurred speech/left facial droop and left side weakness from previous stroke.

## 2013-10-08 ENCOUNTER — Other Ambulatory Visit (HOSPITAL_COMMUNITY): Payer: Medicare Other

## 2013-10-08 ENCOUNTER — Inpatient Hospital Stay (HOSPITAL_COMMUNITY): Payer: Medicare Other

## 2013-10-08 DIAGNOSIS — I379 Nonrheumatic pulmonary valve disorder, unspecified: Secondary | ICD-10-CM

## 2013-10-08 DIAGNOSIS — R55 Syncope and collapse: Secondary | ICD-10-CM

## 2013-10-08 LAB — BASIC METABOLIC PANEL
ANION GAP: 11 (ref 5–15)
BUN: 28 mg/dL — ABNORMAL HIGH (ref 6–23)
CALCIUM: 8.4 mg/dL (ref 8.4–10.5)
CO2: 27 meq/L (ref 19–32)
Chloride: 105 mEq/L (ref 96–112)
Creatinine, Ser: 1.26 mg/dL — ABNORMAL HIGH (ref 0.50–1.10)
GFR calc Af Amer: 42 mL/min — ABNORMAL LOW (ref >90)
GFR calc non Af Amer: 36 mL/min — ABNORMAL LOW (ref >90)
Glucose, Bld: 103 mg/dL — ABNORMAL HIGH (ref 70–99)
POTASSIUM: 4 meq/L (ref 3.7–5.3)
SODIUM: 143 meq/L (ref 137–147)

## 2013-10-08 LAB — CBC
HCT: 27.9 % — ABNORMAL LOW (ref 36.0–46.0)
Hemoglobin: 9 g/dL — ABNORMAL LOW (ref 12.0–15.0)
MCH: 30.4 pg (ref 26.0–34.0)
MCHC: 32.3 g/dL (ref 30.0–36.0)
MCV: 94.3 fL (ref 78.0–100.0)
PLATELETS: 274 10*3/uL (ref 150–400)
RBC: 2.96 MIL/uL — AB (ref 3.87–5.11)
RDW: 14.4 % (ref 11.5–15.5)
WBC: 8.6 10*3/uL (ref 4.0–10.5)

## 2013-10-08 MED ORDER — ENOXAPARIN SODIUM 30 MG/0.3ML ~~LOC~~ SOLN
30.0000 mg | SUBCUTANEOUS | Status: DC
  Administered 2013-10-08 – 2013-10-09 (×2): 30 mg via SUBCUTANEOUS
  Filled 2013-10-08 (×2): qty 0.3

## 2013-10-08 MED ORDER — ALBUTEROL SULFATE (2.5 MG/3ML) 0.083% IN NEBU
2.5000 mg | INHALATION_SOLUTION | Freq: Four times a day (QID) | RESPIRATORY_TRACT | Status: DC | PRN
  Administered 2013-10-09: 2.5 mg via RESPIRATORY_TRACT
  Filled 2013-10-08: qty 3

## 2013-10-08 NOTE — Consult Note (Signed)
Reason for Consult:syncope Referring Physician:Hawkins  Carla Bonilla is an 80 y.o. female.  HPI: This is a 80 year old female patient Dr. Velvet Bathe who we are asked to see for syncope. Yesterday she slipped and fell while getting out of the shower. She suffered lacerations and bruising to her right arm and right leg. She  Became very anxious when she saw blood. When EMS arrived she had a syncopal episode for about 1 minute. She had no seizure activity or chest pain. She has had a prior stroke a few years ago with associated slurred speech and left facial droop. She is very difficult to understand. She has close family friends at the bedside that her able to interpret her speech for me. She lives alone and has a caretaker most days. She has had several falls. She has a nephew that [place] and is her only relative. She denies any history of syncope, cardiac history, chest pain, palpitations, dyspnea, dizziness, diaphoresis or presyncope. She does exercises every day and says she is very strong. Her mother lived to be 41. She never smoked. She has no history of diabetes. She does have hypertension and hyperlipidemia.  CT scan was negative. EKG shows normal sinus rhythm with inferior Q waves. She is not on telemetry.  Past Medical History  Diagnosis Date  . Hypertension   . Blind left eye   . Glaucoma   . Hearing loss   . CVA (cerebral vascular accident)     speech problem    Past Surgical History  Procedure Laterality Date  . Shoulder surgery      RIGHT shoulder open treatment internal fixation with locking plate. Date of surgery November 25, 2010  . Orif humerus fracture  11/25/2010    Procedure: OPEN REDUCTION INTERNAL FIXATION (ORIF) PROXIMAL HUMERUS FRACTURE;  Surgeon: Arther Abbott, MD;  Location: [facility];  Service: Orthopedics;  Laterality: Right;  . Eye surgery      left    Family History  Problem Relation Age of Onset  . Heart disease      Social History:   reports that she has never smoked. She does not have any smokeless tobacco history on file. She reports that she does not drink alcohol or use illicit drugs.  Allergies: No Known Allergies  Medications:  Scheduled Meds: . amLODipine  5 mg Oral Daily  . aspirin EC  81 mg Oral Daily  . clopidogrel  75 mg Oral Daily  . enoxaparin (LOVENOX) injection  30 mg Subcutaneous Q24H  . furosemide  40 mg Oral Daily  . gabapentin  100 mg Oral TID  . gatifloxacin  1 drop Both Eyes QID  . losartan  100 mg Oral Daily  . metoprolol tartrate  25 mg Oral BID  . timolol  1 drop Right Eye BID   Continuous Infusions:  PRN Meds:.albuterol   Results for orders placed during the hospital encounter of 10/07/13 (from the past 48 hour(s))  CBC WITH DIFFERENTIAL     Status: Abnormal   Collection Time    10/07/13 11:55 AM      Result Value Ref Range   WBC 10.4  4.0 - 10.5 K/uL   RBC 3.31 (*) 3.87 - 5.11 MIL/uL   Hemoglobin 10.5 (*) 12.0 - 15.0 g/dL   HCT 31.8 (*) 36.0 - 46.0 %   MCV 96.1  78.0 - 100.0 fL   MCH 31.7  26.0 - 34.0 pg   MCHC 33.0  30.0 - 36.0  g/dL   RDW 14.1  11.5 - 15.5 %   Platelets 312  150 - 400 K/uL   Neutrophils Relative % 68  43 - 77 %   Neutro Abs 7.1  1.7 - 7.7 K/uL   Lymphocytes Relative 20  12 - 46 %   Lymphs Abs 2.1  0.7 - 4.0 K/uL   Monocytes Relative 8  3 - 12 %   Monocytes Absolute 0.9  0.1 - 1.0 K/uL   Eosinophils Relative 3  0 - 5 %   Eosinophils Absolute 0.3  0.0 - 0.7 K/uL   Basophils Relative 1  0 - 1 %   Basophils Absolute 0.1  0.0 - 0.1 K/uL  BASIC METABOLIC PANEL     Status: Abnormal   Collection Time    10/07/13 11:55 AM      Result Value Ref Range   Sodium 139  137 - 147 mEq/L   Potassium 4.0  3.7 - 5.3 mEq/L   Chloride 100  96 - 112 mEq/L   CO2 27  19 - 32 mEq/L   Glucose, Bld 146 (*) 70 - 99 mg/dL   BUN 35 (*) 6 - 23 mg/dL   Creatinine, Ser 1.40 (*) 0.50 - 1.10 mg/dL   Calcium 8.8  8.4 - 10.5 mg/dL   GFR calc non Af Amer 32 (*) >90 mL/min   GFR calc  Af Amer 37 (*) >90 mL/min   Comment: (NOTE)     The eGFR has been calculated using the CKD EPI equation.     This calculation has not been validated in all clinical situations.     eGFR's persistently <90 mL/min signify possible Chronic Kidney     Disease.   Anion gap 12  5 - 15  TROPONIN I     Status: None   Collection Time    10/07/13 11:55 AM      Result Value Ref Range   Troponin I <0.30  <0.30 ng/mL   Comment:            Due to the release kinetics of cTnI,     a negative result within the first hours     of the onset of symptoms does not rule out     myocardial infarction with certainty.     If myocardial infarction is still suspected,     repeat the test at appropriate intervals.  URINALYSIS, ROUTINE W REFLEX MICROSCOPIC     Status: None   Collection Time    10/07/13  1:25 PM      Result Value Ref Range   Color, Urine YELLOW  YELLOW   APPearance CLEAR  CLEAR   Specific Gravity, Urine 1.010  1.005 - 1.030   pH 6.0  5.0 - 8.0   Glucose, UA NEGATIVE  NEGATIVE mg/dL   Hgb urine dipstick NEGATIVE  NEGATIVE   Bilirubin Urine NEGATIVE  NEGATIVE   Ketones, ur NEGATIVE  NEGATIVE mg/dL   Protein, ur NEGATIVE  NEGATIVE mg/dL   Urobilinogen, UA 0.2  0.0 - 1.0 mg/dL   Nitrite NEGATIVE  NEGATIVE   Leukocytes, UA NEGATIVE  NEGATIVE   Comment: MICROSCOPIC NOT DONE ON URINES WITH NEGATIVE PROTEIN, BLOOD, LEUKOCYTES, NITRITE, OR GLUCOSE <1000 mg/dL.  CBC     Status: Abnormal   Collection Time    10/08/13  6:59 AM      Result Value Ref Range   WBC 8.6  4.0 - 10.5 K/uL   RBC 2.96 (*) 3.87 - 5.11  MIL/uL   Hemoglobin 9.0 (*) 12.0 - 15.0 g/dL   HCT 27.9 (*) 36.0 - 46.0 %   MCV 94.3  78.0 - 100.0 fL   MCH 30.4  26.0 - 34.0 pg   MCHC 32.3  30.0 - 36.0 g/dL   RDW 14.4  11.5 - 15.5 %   Platelets 274  150 - 400 K/uL  BASIC METABOLIC PANEL     Status: Abnormal   Collection Time    10/08/13  6:59 AM      Result Value Ref Range   Sodium 143  137 - 147 mEq/L   Potassium 4.0  3.7 - 5.3  mEq/L   Chloride 105  96 - 112 mEq/L   CO2 27  19 - 32 mEq/L   Glucose, Bld 103 (*) 70 - 99 mg/dL   BUN 28 (*) 6 - 23 mg/dL   Creatinine, Ser 1.26 (*) 0.50 - 1.10 mg/dL   Calcium 8.4  8.4 - 10.5 mg/dL   GFR calc non Af Amer 36 (*) >90 mL/min   GFR calc Af Amer 42 (*) >90 mL/min   Comment: (NOTE)     The eGFR has been calculated using the CKD EPI equation.     This calculation has not been validated in all clinical situations.     eGFR's persistently <90 mL/min signify possible Chronic Kidney     Disease.   Anion gap 11  5 - 15    Ct Head Wo Contrast  10/07/2013   CLINICAL DATA:  Syncope.  Hit back of head.  EXAM: CT HEAD WITHOUT CONTRAST  TECHNIQUE: Contiguous axial images were obtained from the base of the skull through the vertex without intravenous contrast.  COMPARISON:  11/20/2010  FINDINGS: Ventricles are normal in configuration. There is ventricular and sulcal enlargement reflecting moderate atrophy. There are old infarcts, 1 along the left lateral frontal lobe another involving the right parietal lobe, stable. Other patchy areas of white matter hypoattenuation noted consistent with moderate chronic microvascular ischemic change. An old white matter lacune infarct is noted in the posterior right frontal lobe.  There are no parenchymal masses or mass effect. No evidence of a recent cortical infarct.  There are no extra-axial masses or abnormal fluid collections.  No intracranial hemorrhage.  No skull fracture. Small fluid level in the posterior left maxillary sinus. Sinuses otherwise essentially clear. Clear mastoid air cells.  IMPRESSION: 1. No acute intracranial abnormalities. 2. Atrophy, chronic microvascular ischemic change and old infarcts.   Electronically Signed   By: Lajean Manes M.D.   On: 10/07/2013 15:23   [place] Carotid Bilateral  10/08/2013   CLINICAL DATA:  Syncope  EXAM: BILATERAL CAROTID DUPLEX ULTRASOUND  TECHNIQUE: Pearline Cables scale imaging, color Doppler and duplex ultrasound were  performed of bilateral carotid and vertebral arteries in the neck.  COMPARISON:  None.  FINDINGS: Criteria: Quantification of carotid stenosis is based on velocity parameters that correlate the residual internal carotid diameter with NASCET-based stenosis levels, using the diameter of the distal internal carotid lumen as the denominator for stenosis measurement.  The following velocity measurements were obtained:  RIGHT  ICA:  133 cm/sec  CCA:  84 cm/sec  SYSTOLIC ICA/CCA RATIO:  1.6  DIASTOLIC ICA/CCA RATIO:  ECA:  183 cm/sec  LEFT  ICA:  102 cm/sec  CCA:  75 cm/sec  SYSTOLIC ICA/CCA RATIO:  1.4  DIASTOLIC ICA/CCA RATIO:  ECA:  97 cm/sec  RIGHT CAROTID ARTERY: Moderate calcified plaque in the bulb. Low resistance internal  carotid Doppler pattern.  RIGHT VERTEBRAL ARTERY:  Antegrade.  Low resistance Doppler pattern.  LEFT CAROTID ARTERY: Mild plaque in the bulb. Low resistance internal carotid Doppler pattern.  LEFT VERTEBRAL ARTERY:  Antegrade.  Normal Doppler pattern.  IMPRESSION: 50-69% stenosis in the right internal carotid artery.  Less than 50% stenosis in the left internal carotid artery.   Electronically Signed   By: Maryclare Bean M.D.   On: 10/08/2013 10:29   Dg Chest Portable 1 View  10/07/2013   CLINICAL DATA:  Arm pain.  Fell today.  EXAM: PORTABLE CHEST - 1 VIEW  COMPARISON:  11/23/2010  FINDINGS: Cardiac silhouette is normal in size. Normal mediastinal and hilar contours.  Clear lungs.  No pleural effusion or pneumothorax.  Old fracture of the right humerus has been reduced with external fixation. It is healed.  IMPRESSION: No acute cardiopulmonary disease   Electronically Signed   By: Lajean Manes M.D.   On: 10/07/2013 12:13    ROS See HPI Eyes: Blind in left eye and glaucoma Ears: Positive for hearing loss Cardiovascular: Negative for chest pain, palpitations,irregular heartbeat, dyspnea, dyspnea on exertion, near-syncope, orthopnea, paroxysmal nocturnal dyspnea and edema, claudication,  cyanosis,.  Respiratory:   Negative for cough, hemoptysis, shortness of breath, sleep disturbances due to breathing, sputum production and wheezing.   Endocrine: Negative for cold intolerance and heat intolerance.  Hematologic/Lymphatic: Negative for adenopathy and bleeding problem. Does not bruise/bleed easily.  Musculoskeletal: Negative.   Gastrointestinal: Negative for nausea, vomiting, reflux, abdominal pain, diarrhea, constipation.   Genitourinary: Negative for bladder incontinence, dysuria, flank pain, frequency, hematuria, hesitancy, nocturia and urgency.  Neurological: Prior CVA with slurred speech and left-sided facial droop and weakness.  Allergic/Immunologic: Negative for environmental allergies.  Blood pressure 117/45, pulse 65, temperature 98 F (36.7 C), temperature source Oral, resp. rate 16, height _0  (1.6 m), weight 140 lb 14 oz (63.9 kg), SpO2 96.00%. Physical Exam PHYSICAL EXAM: Well-nournished, in no acute distress. Neck: No JVD, HJR, Bruit, or thyroid enlargement Lungs: No tachypnea, clear without wheezing, rales, or rhonchi Cardiovascular: RRR, PMI not displaced, heart sounds normal, no murmurs, gallops, bruit, thrill, or heave. Abdomen: BS normal. Soft without organomegaly, masses, lesions or tenderness. Extremities: without cyanosis, clubbing or edema. Good distal pulses bilateral SKin: Warm, no lesions or rashes  Musculoskeletal: No deformities Neuro: no focal signs  EKG normal sinus rhythm with inferior Q waves unchanged from EKG in 2012.   Assessment/Plan: Syncope  Question vasovagal secondary to a fall and sight  of blood. Will place on telemetry and check 2-D echo for LV function. Patient does not want any extraordinary measures. Orthostatic blood pressure checks. Neurology to see.  Fall: Patient slipped on a wet floor while getting out of shower. She has had a couple falls recently.  Denies dizziness with thise    Prior CVA with left sided weakness and  difficult  speach  HTN  Ermalinda Barrios 10/08/2013, 10:47 AM  Patient seen and examined  Agree with findings of M Lenze above and have amended.    Hartford Poli had fall  Then after seeing blood got presyncopal and then passed out.  She notes some dizziness with seeing.  I agree with plans of tele and echo  I would check orthostatic BP/P  Dorris Carnes   Will cointie to follow.

## 2013-10-08 NOTE — Progress Notes (Signed)
  Echocardiogram 2D Echocardiogram has been performed.  Corra Kaine 10/08/2013, 2:10 PM

## 2013-10-08 NOTE — Progress Notes (Signed)
Subjective: She says she feels well. She has no new complaints. She had what seemed to be a witnessed syncopal episode at her home. EMS was present and said that she passed out and lost consciousness for approximately a minute  Objective: Vital signs in last 24 hours: Temp:  [97.5 F (36.4 C)-98.5 F (36.9 C)] 98 F (36.7 C) (07/20 0711) Pulse Rate:  [57-75] 65 (07/20 0711) Resp:  [13-21] 16 (07/20 0711) BP: (108-136)/(41-59) 117/45 mmHg (07/20 0711) SpO2:  [94 %-99 %] 96 % (07/20 0711) Weight:  [63.504 kg (140 lb)-63.9 kg (140 lb 14 oz)] 63.9 kg (140 lb 14 oz) (07/19 1726) Weight change:  Last BM Date: 10/06/13  Intake/Output from previous day:    PHYSICAL EXAM General appearance: alert, cooperative and Her speech is very difficult to understand because of previous strokes Resp: clear to auscultation bilaterally Cardio: regular rate and rhythm, S1, S2 normal, no murmur, click, rub or gallop GI: soft, non-tender; bowel sounds normal; no masses,  no organomegaly Extremities: extremities normal, atraumatic, no cyanosis or edema  Lab Results:  Results for orders placed during the hospital encounter of 10/07/13 (from the past 48 hour(s))  CBC WITH DIFFERENTIAL     Status: Abnormal   Collection Time    10/07/13 11:55 AM      Result Value Ref Range   WBC 10.4  4.0 - 10.5 K/uL   RBC 3.31 (*) 3.87 - 5.11 MIL/uL   Hemoglobin 10.5 (*) 12.0 - 15.0 g/dL   HCT 31.8 (*) 36.0 - 46.0 %   MCV 96.1  78.0 - 100.0 fL   MCH 31.7  26.0 - 34.0 pg   MCHC 33.0  30.0 - 36.0 g/dL   RDW 14.1  11.5 - 15.5 %   Platelets 312  150 - 400 K/uL   Neutrophils Relative % 68  43 - 77 %   Neutro Abs 7.1  1.7 - 7.7 K/uL   Lymphocytes Relative 20  12 - 46 %   Lymphs Abs 2.1  0.7 - 4.0 K/uL   Monocytes Relative 8  3 - 12 %   Monocytes Absolute 0.9  0.1 - 1.0 K/uL   Eosinophils Relative 3  0 - 5 %   Eosinophils Absolute 0.3  0.0 - 0.7 K/uL   Basophils Relative 1  0 - 1 %   Basophils Absolute 0.1  0.0 - 0.1  K/uL  BASIC METABOLIC PANEL     Status: Abnormal   Collection Time    10/07/13 11:55 AM      Result Value Ref Range   Sodium 139  137 - 147 mEq/L   Potassium 4.0  3.7 - 5.3 mEq/L   Chloride 100  96 - 112 mEq/L   CO2 27  19 - 32 mEq/L   Glucose, Bld 146 (*) 70 - 99 mg/dL   BUN 35 (*) 6 - 23 mg/dL   Creatinine, Ser 1.40 (*) 0.50 - 1.10 mg/dL   Calcium 8.8  8.4 - 10.5 mg/dL   GFR calc non Af Amer 32 (*) >90 mL/min   GFR calc Af Amer 37 (*) >90 mL/min   Comment: (NOTE)     The eGFR has been calculated using the CKD EPI equation.     This calculation has not been validated in all clinical situations.     eGFR's persistently <90 mL/min signify possible Chronic Kidney     Disease.   Anion gap 12  5 - 15  TROPONIN I  Status: None   Collection Time    10/07/13 11:55 AM      Result Value Ref Range   Troponin I <0.30  <0.30 ng/mL   Comment:            Due to the release kinetics of cTnI,     a negative result within the first hours     of the onset of symptoms does not rule out     myocardial infarction with certainty.     If myocardial infarction is still suspected,     repeat the test at appropriate intervals.  URINALYSIS, ROUTINE W REFLEX MICROSCOPIC     Status: None   Collection Time    10/07/13  1:25 PM      Result Value Ref Range   Color, Urine YELLOW  YELLOW   APPearance CLEAR  CLEAR   Specific Gravity, Urine 1.010  1.005 - 1.030   pH 6.0  5.0 - 8.0   Glucose, UA NEGATIVE  NEGATIVE mg/dL   Hgb urine dipstick NEGATIVE  NEGATIVE   Bilirubin Urine NEGATIVE  NEGATIVE   Ketones, ur NEGATIVE  NEGATIVE mg/dL   Protein, ur NEGATIVE  NEGATIVE mg/dL   Urobilinogen, UA 0.2  0.0 - 1.0 mg/dL   Nitrite NEGATIVE  NEGATIVE   Leukocytes, UA NEGATIVE  NEGATIVE   Comment: MICROSCOPIC NOT DONE ON URINES WITH NEGATIVE PROTEIN, BLOOD, LEUKOCYTES, NITRITE, OR GLUCOSE <1000 mg/dL.  CBC     Status: Abnormal   Collection Time    10/08/13  6:59 AM      Result Value Ref Range   WBC 8.6  4.0  - 10.5 K/uL   RBC 2.96 (*) 3.87 - 5.11 MIL/uL   Hemoglobin 9.0 (*) 12.0 - 15.0 g/dL   HCT 27.9 (*) 36.0 - 46.0 %   MCV 94.3  78.0 - 100.0 fL   MCH 30.4  26.0 - 34.0 pg   MCHC 32.3  30.0 - 36.0 g/dL   RDW 14.4  11.5 - 15.5 %   Platelets 274  150 - 400 K/uL  BASIC METABOLIC PANEL     Status: Abnormal   Collection Time    10/08/13  6:59 AM      Result Value Ref Range   Sodium 143  137 - 147 mEq/L   Potassium 4.0  3.7 - 5.3 mEq/L   Chloride 105  96 - 112 mEq/L   CO2 27  19 - 32 mEq/L   Glucose, Bld 103 (*) 70 - 99 mg/dL   BUN 28 (*) 6 - 23 mg/dL   Creatinine, Ser 1.26 (*) 0.50 - 1.10 mg/dL   Calcium 8.4  8.4 - 10.5 mg/dL   GFR calc non Af Amer 36 (*) >90 mL/min   GFR calc Af Amer 42 (*) >90 mL/min   Comment: (NOTE)     The eGFR has been calculated using the CKD EPI equation.     This calculation has not been validated in all clinical situations.     eGFR's persistently <90 mL/min signify possible Chronic Kidney     Disease.   Anion gap 11  5 - 15    ABGS No results found for this basename: PHART, PCO2, PO2ART, TCO2, HCO3,  in the last 72 hours CULTURES No results found for this or any previous visit (from the past 240 hour(s)). Studies/Results: Ct Head Wo Contrast  10/07/2013   CLINICAL DATA:  Syncope.  Hit back of head.  EXAM: CT HEAD WITHOUT CONTRAST  TECHNIQUE:  Contiguous axial images were obtained from the base of the skull through the vertex without intravenous contrast.  COMPARISON:  11/20/2010  FINDINGS: Ventricles are normal in configuration. There is ventricular and sulcal enlargement reflecting moderate atrophy. There are old infarcts, 1 along the left lateral frontal lobe another involving the right parietal lobe, stable. Other patchy areas of white matter hypoattenuation noted consistent with moderate chronic microvascular ischemic change. An old white matter lacune infarct is noted in the posterior right frontal lobe.  There are no parenchymal masses or mass effect. No  evidence of a recent cortical infarct.  There are no extra-axial masses or abnormal fluid collections.  No intracranial hemorrhage.  No skull fracture. Small fluid level in the posterior left maxillary sinus. Sinuses otherwise essentially clear. Clear mastoid air cells.  IMPRESSION: 1. No acute intracranial abnormalities. 2. Atrophy, chronic microvascular ischemic change and old infarcts.   Electronically Signed   By: Lajean Manes M.D.   On: 10/07/2013 15:23   Dg Chest Portable 1 View  10/07/2013   CLINICAL DATA:  Arm pain.  Fell today.  EXAM: PORTABLE CHEST - 1 VIEW  COMPARISON:  11/23/2010  FINDINGS: Cardiac silhouette is normal in size. Normal mediastinal and hilar contours.  Clear lungs.  No pleural effusion or pneumothorax.  Old fracture of the right humerus has been reduced with external fixation. It is healed.  IMPRESSION: No acute cardiopulmonary disease   Electronically Signed   By: Lajean Manes M.D.   On: 10/07/2013 12:13    Medications:  Prior to Admission:  Prescriptions prior to admission  Medication Sig Dispense Refill  . alendronate (FOSAMAX) 70 MG tablet Take 70 mg by mouth once a week.      Marland Kitchen amLODipine (NORVASC) 5 MG tablet Take 5 mg by mouth daily.      Marland Kitchen aspirin EC 81 MG tablet Take 81 mg by mouth daily.        . clopidogrel (PLAVIX) 75 MG tablet Take 75 mg by mouth daily.        . furosemide (LASIX) 40 MG tablet Take 40 mg by mouth daily.      Marland Kitchen gabapentin (NEURONTIN) 100 MG capsule Take 1 capsule (100 mg total) by mouth 3 (three) times daily.  90 capsule  5  . losartan (COZAAR) 100 MG tablet Take 100 mg by mouth daily.      . metoprolol tartrate (LOPRESSOR) 25 MG tablet Take 25 mg by mouth 2 (two) times daily.        Marland Kitchen moxifloxacin (VIGAMOX) 0.5 % ophthalmic solution Place 1 drop into the left eye 2 (two) times daily.       Marland Kitchen PROAIR HFA 108 (90 BASE) MCG/ACT inhaler Inhale 2 puffs into the lungs every 6 (six) hours as needed for wheezing or shortness of breath.       .  timolol (TIMOPTIC) 0.25 % ophthalmic solution Place 1 drop into the right eye 2 (two) times daily.        Scheduled: . amLODipine  5 mg Oral Daily  . aspirin EC  81 mg Oral Daily  . clopidogrel  75 mg Oral Daily  . enoxaparin (LOVENOX) injection  30 mg Subcutaneous Q24H  . furosemide  40 mg Oral Daily  . gabapentin  100 mg Oral TID  . gatifloxacin  1 drop Both Eyes QID  . losartan  100 mg Oral Daily  . metoprolol tartrate  25 mg Oral BID  . timolol  1 drop Right Eye BID  Continuous:  [id number]  Assesment: She is admitted with syncope. She's had some minor injuries related to her fall. She's not had any further episodes of syncope here. She's had previous strokes. Active Problems:   Syncope    Plan: She will have cardiology and neurology consultation.    LOS: 1 day   Syre Knerr L 10/08/2013, 8:57 AM

## 2013-10-09 ENCOUNTER — Other Ambulatory Visit (HOSPITAL_COMMUNITY): Payer: Medicare Other

## 2013-10-09 DIAGNOSIS — R55 Syncope and collapse: Principal | ICD-10-CM

## 2013-10-09 NOTE — Progress Notes (Signed)
Subjective: No SOB  NO CP Objective: Filed Vitals:   10/08/13 1500 10/08/13 2158 10/08/13 2344 10/09/13 0519  BP:  107/48 111/79 114/58  Pulse:  73 71 77  Temp: 98.4 F (36.9 C) 98.5 F (36.9 C) 99.4 F (37.4 C) 98.9 F (37.2 C)  TempSrc: Oral Oral Oral Oral  Resp: 16 16 18 18   Height:      Weight:      SpO2:    97%   Weight change:   Intake/Output Summary (Last 24 hours) at 10/09/13 0824 Last data filed at 10/08/13 1700  Gross per 24 hour  Intake    480 ml  Output    550 ml  Net    -70 ml    General  Patinet is in NAD Neck  JVP is normal Heart: Regular rate and rhythm, without murmurs, rubs, gallops.  Lungs CTA   Exemities:  NO edema.   Neuro: Grossly intact, nonfocal.  Tele:  SR    Lab Results: No results found for this or any previous visit (from the past 24 hour(s)).  Studies/Results: [place] Carotid Bilateral  10/08/2013   CLINICAL DATA:  Syncope  EXAM: BILATERAL CAROTID DUPLEX ULTRASOUND  TECHNIQUE: Wallace CullensGray scale imaging, color Doppler and duplex ultrasound were performed of bilateral carotid and vertebral arteries in the neck.  COMPARISON:  None.  FINDINGS: Criteria: Quantification of carotid stenosis is based on velocity parameters that correlate the residual internal carotid diameter with NASCET-based stenosis levels, using the diameter of the distal internal carotid lumen as the denominator for stenosis measurement.  The following velocity measurements were obtained:  RIGHT  ICA:  133 cm/sec  CCA:  84 cm/sec  SYSTOLIC ICA/CCA RATIO:  1.6  DIASTOLIC ICA/CCA RATIO:  ECA:  183 cm/sec  LEFT  ICA:  102 cm/sec  CCA:  75 cm/sec  SYSTOLIC ICA/CCA RATIO:  1.4  DIASTOLIC ICA/CCA RATIO:  ECA:  97 cm/sec  RIGHT CAROTID ARTERY: Moderate calcified plaque in the bulb. Low resistance internal carotid Doppler pattern.  RIGHT VERTEBRAL ARTERY:  Antegrade.  Low resistance Doppler pattern.  LEFT CAROTID ARTERY: Mild plaque in the bulb. Low resistance internal carotid Doppler pattern.  LEFT  VERTEBRAL ARTERY:  Antegrade.  Normal Doppler pattern.  IMPRESSION: 50-69% stenosis in the right internal carotid artery.  Less than 50% stenosis in the left internal carotid artery.   Electronically Signed   By: Maryclare BeanArt  Hoss M.D.   On: 10/08/2013 10:29    Echo:  10/08/13  Study Conclusions  - Left ventricle: The cavity size was mildly reduced. Wall thickness was increased in a pattern of mild LVH. Systolic function was vigorous. The estimated ejection fraction was in the range of 65% to 70%. Doppler parameters are consistent with abnormal left ventricular relaxation (grade 1 diastolic dysfunction). - Aortic valve: There was trivial regurgitation. - Pulmonary arteries: PA peak pressure: 33 mm Hg (S).   Medications: REviewed   @PROBHOSP @  1. Syncope  Difficult historian but spell appears to be vagal.  Tele has not showed signficant arrhythmia.  Echo is normal   I would follow clinically  I would not plan any further testing.   OK to d/c from cardiac standpoint  Will be available as needed     LOS: 2 days   Dietrich PatesPaula Nohely Whitehorn 10/09/2013, 8:24 AM

## 2013-10-09 NOTE — Progress Notes (Signed)
PT Cancellation Note  Patient Details Name: Carla Bonilla MRN: [id number] DOB: 02/22/1921   Cancelled Treatment:    Reason Eval/Treat Not Completed: Other (comment)  Received order and chart reviewed.  Noted pt possibility for d/c today, and HHPT services have already been orders.  Spoke with case management and pt does not need any DME supplies or additional PT services.  Will need PT order to re-assess if needed.    Susann Lawhorne 10/09/2013, 9:51 AM

## 2013-10-09 NOTE — Care Management Note (Signed)
    Page 1 of 1   10/09/2013     9:39:06 AM CARE MANAGEMENT NOTE 10/09/2013  Patient:  Carla Bonilla,Deshauna M   Account Number:  [id number]  Date Initiated:  10/09/2013  Documentation initiated by:  Sharrie RothmanBLACKWELL,Khyle Goodell C  Subjective/Objective Assessment:   Pt admitted from home with fall and syncope. Pt has 2 caregivers that are with her during the day and is alone at night. Pt has a cane and walker for home use.     Action/Plan:   HH arranged with [facility] (per pts choice). Alroy BailiffLinda Lothian of [facility] is aware and will collect information from the chart. HH services to start within 48 hours of discharge. No DME needs noted. PT and pts nurse aware.   Anticipated DC Date:  10/09/2013   Anticipated DC Plan:  HOME W HOME HEALTH SERVICES      DC Planning Services  CM consult      [facility]   Choice offered to / List presented to:  C-1 Patient        HH arranged  HH-1 RN  HH-2 PT      [facility]   Status of service:  Completed, signed off Medicare Important Message given?  NA - LOS <3 / Initial given by admissions (If response is "NO", the following Medicare IM given date fields will be blank) Date Medicare IM given:   Medicare IM given by:   Date Additional Medicare IM given:   Additional Medicare IM given by:    Discharge Disposition:  HOME W HOME HEALTH SERVICES  Per UR Regulation:    If discussed at Long Length of Stay Meetings, dates discussed:    Comments:  10/09/13 0940 Arlyss Queenammy Carlas Vandyne, RN BSN CM

## 2013-10-09 NOTE — Consult Note (Addendum)
[place] A. Merlene Laughter, MD     [email or web address]          Carla Bonilla is an 80 y.o. female.   ASSESSMENT/PLAN: 1. 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 No treatable neurological causes are identified.Physical therapy is suggested. He also may be a good idea for the patient to have an evaluation of her home by therapist to address any changes that can be made to reduce risk of falls at home. EEG is pending.  2. Severe dysarthria status post previous infarcts. The patient is on dual antiplatelet agents which is not suggested. I will therefore discontinue the Plavix and continue the patient on aspirin only.  3. Hypertension.  This is a 80 year old white female who presents to the hospital after having a fall. The patient tells me that she's been having issues fallen at home. The patient has severe dysarthria which limits the evaluation. She does not report any problems at this time. It appears that she has had a stroke in the past as a cause of her dysarthria. The review of systems is very limited due to the severe dysarthria but unremarkable. There is some suggestion that she may have lost consciousness/syncope but this is not clear.  GENERAL: This is a pleasant female in no acute distress.  HEENT: Supple. Atraumatic normocephalic.   ABDOMEN: soft  EXTREMITIES: Mild pitting edema of the feet and ankles. There is bruising of the legs bilaterally. There is marked arthritic changes of the hands, knees and ankles.  BACK: Normal.  SKIN: Normal by inspection.    MENTAL STATUS: She is awake and alert. She has severe Dysarthria that speech is mostly incomprehensible. She does follow commands briskly however.  CRANIAL NERVES: The left pupil/I is clouded. The right is large and irregularly shaped and minimally reactive.The extra ocular movements  are full, there is no significant nystagmus; visual fields are full; upper and lower facial muscles are normal in strength and symmetric, there is no flattening of the nasolabial folds; tongue is midline; uvula is midline; shoulder elevation is normal.  MOTOR: Normal tone, bulk and strength; no pronator drift.  COORDINATION: Left finger to nose is normal, right finger to nose is normal, No rest tremor; no intention tremor; no postural tremor; no bradykinesia.  REFLEXES: Deep tendon reflexes are symmetrical and normal. Babinski reflexes are flexor bilaterally.   SENSATION: Normal to light touch.  Head CT scan is reviewed in person. There is severe confluent leukoencephalopathy consistent with chronic microvascular ischemic changes. There is a left frontal encephalomalacia consistent with an old cortical infarcts. There is moderate atrophy of the Cerebrum and cerebellum.   Past Medical History  Diagnosis Date  . Hypertension   . Blind left eye   . Glaucoma   . Hearing loss   . CVA (cerebral vascular accident)     speech problem    Past Surgical History  Procedure Laterality Date  . Shoulder surgery      RIGHT shoulder open treatment internal fixation with locking plate. Date of surgery November 25, 2010  . Orif humerus fracture  11/25/2010    Procedure: OPEN REDUCTION INTERNAL FIXATION (ORIF) PROXIMAL HUMERUS FRACTURE;  Surgeon: Arther Abbott, MD;  Location: [facility];  Service: Orthopedics;  Laterality: Right;  . Eye surgery      left    Family History  Problem Relation Age of Onset  . Heart disease      Social  History:  reports that she has never smoked. She does not have any smokeless tobacco history on file. She reports that she does not drink alcohol or use illicit drugs.  Allergies: No Known Allergies  Medications: Prior to Admission medications   Medication Sig Start Date End Date Taking? Authorizing Provider  alendronate (FOSAMAX) 70 MG tablet Take 70 mg by mouth once  a week. 01/29/13  Yes Historical Provider, MD  amLODipine (NORVASC) 5 MG tablet Take 5 mg by mouth daily.   Yes Historical Provider, MD  aspirin EC 81 MG tablet Take 81 mg by mouth daily.     Yes Historical Provider, MD  clopidogrel (PLAVIX) 75 MG tablet Take 75 mg by mouth daily.     Yes Historical Provider, MD  furosemide (LASIX) 40 MG tablet Take 40 mg by mouth daily. 01/29/13  Yes Historical Provider, MD  gabapentin (NEURONTIN) 100 MG capsule Take 1 capsule (100 mg total) by mouth 3 (three) times daily. 07/17/13  Yes Carole Civil, MD  losartan (COZAAR) 100 MG tablet Take 100 mg by mouth daily. 01/29/13  Yes Historical Provider, MD  metoprolol tartrate (LOPRESSOR) 25 MG tablet Take 25 mg by mouth 2 (two) times daily.     Yes Historical Provider, MD  moxifloxacin (VIGAMOX) 0.5 % ophthalmic solution Place 1 drop into the left eye 2 (two) times daily.    Yes Historical Provider, MD  PROAIR HFA 108 (90 BASE) MCG/ACT inhaler Inhale 2 puffs into the lungs every 6 (six) hours as needed for wheezing or shortness of breath.  01/04/13  Yes Historical Provider, MD  timolol (TIMOPTIC) 0.25 % ophthalmic solution Place 1 drop into the right eye 2 (two) times daily.    Yes Historical Provider, MD    Scheduled Meds: . amLODipine  5 mg Oral Daily  . aspirin EC  81 mg Oral Daily  . clopidogrel  75 mg Oral Daily  . enoxaparin (LOVENOX) injection  30 mg Subcutaneous Q24H  . furosemide  40 mg Oral Daily  . gabapentin  100 mg Oral TID  . gatifloxacin  1 drop Both Eyes QID  . losartan  100 mg Oral Daily  . metoprolol tartrate  25 mg Oral BID  . timolol  1 drop Right Eye BID   Continuous Infusions:  PRN Meds:.albuterol   Blood pressure 114/58, pulse 77, temperature 98.9 F (37.2 C), temperature source Oral, resp. rate 18, height 5' 3"  (1.6 m), weight 63.9 kg (140 lb 14 oz), SpO2 96.00%.   Results for orders placed during the hospital encounter of 10/07/13 (from the past 48 hour(s))  CBC WITH  DIFFERENTIAL     Status: Abnormal   Collection Time    10/07/13 11:55 AM      Result Value Ref Range   WBC 10.4  4.0 - 10.5 K/uL   RBC 3.31 (*) 3.87 - 5.11 MIL/uL   Hemoglobin 10.5 (*) 12.0 - 15.0 g/dL   HCT 31.8 (*) 36.0 - 46.0 %   MCV 96.1  78.0 - 100.0 fL   MCH 31.7  26.0 - 34.0 pg   MCHC 33.0  30.0 - 36.0 g/dL   RDW 14.1  11.5 - 15.5 %   Platelets 312  150 - 400 K/uL   Neutrophils Relative % 68  43 - 77 %   Neutro Abs 7.1  1.7 - 7.7 K/uL   Lymphocytes Relative 20  12 - 46 %   Lymphs Abs 2.1  0.7 - 4.0 K/uL   Monocytes  Relative 8  3 - 12 %   Monocytes Absolute 0.9  0.1 - 1.0 K/uL   Eosinophils Relative 3  0 - 5 %   Eosinophils Absolute 0.3  0.0 - 0.7 K/uL   Basophils Relative 1  0 - 1 %   Basophils Absolute 0.1  0.0 - 0.1 K/uL  BASIC METABOLIC PANEL     Status: Abnormal   Collection Time    10/07/13 11:55 AM      Result Value Ref Range   Sodium 139  137 - 147 mEq/L   Potassium 4.0  3.7 - 5.3 mEq/L   Chloride 100  96 - 112 mEq/L   CO2 27  19 - 32 mEq/L   Glucose, Bld 146 (*) 70 - 99 mg/dL   BUN 35 (*) 6 - 23 mg/dL   Creatinine, Ser 1.40 (*) 0.50 - 1.10 mg/dL   Calcium 8.8  8.4 - 10.5 mg/dL   GFR calc non Af Amer 32 (*) >90 mL/min   GFR calc Af Amer 37 (*) >90 mL/min   Comment: (NOTE)     The eGFR has been calculated using the CKD EPI equation.     This calculation has not been validated in all clinical situations.     eGFR's persistently <90 mL/min signify possible Chronic Kidney     Disease.   Anion gap 12  5 - 15  TROPONIN I     Status: None   Collection Time    10/07/13 11:55 AM      Result Value Ref Range   Troponin I <0.30  <0.30 ng/mL   Comment:            Due to the release kinetics of cTnI,     a negative result within the first hours     of the onset of symptoms does not rule out     myocardial infarction with certainty.     If myocardial infarction is still suspected,     repeat the test at appropriate intervals.  URINALYSIS, ROUTINE W REFLEX  MICROSCOPIC     Status: None   Collection Time    10/07/13  1:25 PM      Result Value Ref Range   Color, Urine YELLOW  YELLOW   APPearance CLEAR  CLEAR   Specific Gravity, Urine 1.010  1.005 - 1.030   pH 6.0  5.0 - 8.0   Glucose, UA NEGATIVE  NEGATIVE mg/dL   Hgb urine dipstick NEGATIVE  NEGATIVE   Bilirubin Urine NEGATIVE  NEGATIVE   Ketones, ur NEGATIVE  NEGATIVE mg/dL   Protein, ur NEGATIVE  NEGATIVE mg/dL   Urobilinogen, UA 0.2  0.0 - 1.0 mg/dL   Nitrite NEGATIVE  NEGATIVE   Leukocytes, UA NEGATIVE  NEGATIVE   Comment: MICROSCOPIC NOT DONE ON URINES WITH NEGATIVE PROTEIN, BLOOD, LEUKOCYTES, NITRITE, OR GLUCOSE <1000 mg/dL.  CBC     Status: Abnormal   Collection Time    10/08/13  6:59 AM      Result Value Ref Range   WBC 8.6  4.0 - 10.5 K/uL   RBC 2.96 (*) 3.87 - 5.11 MIL/uL   Hemoglobin 9.0 (*) 12.0 - 15.0 g/dL   HCT 27.9 (*) 36.0 - 46.0 %   MCV 94.3  78.0 - 100.0 fL   MCH 30.4  26.0 - 34.0 pg   MCHC 32.3  30.0 - 36.0 g/dL   RDW 14.4  11.5 - 15.5 %   Platelets 274  150 - 400  K/uL  BASIC METABOLIC PANEL     Status: Abnormal   Collection Time    10/08/13  6:59 AM      Result Value Ref Range   Sodium 143  137 - 147 mEq/L   Potassium 4.0  3.7 - 5.3 mEq/L   Chloride 105  96 - 112 mEq/L   CO2 27  19 - 32 mEq/L   Glucose, Bld 103 (*) 70 - 99 mg/dL   BUN 28 (*) 6 - 23 mg/dL   Creatinine, Ser 1.26 (*) 0.50 - 1.10 mg/dL   Calcium 8.4  8.4 - 10.5 mg/dL   GFR calc non Af Amer 36 (*) >90 mL/min   GFR calc Af Amer 42 (*) >90 mL/min   Comment: (NOTE)     The eGFR has been calculated using the CKD EPI equation.     This calculation has not been validated in all clinical situations.     eGFR's persistently <90 mL/min signify possible Chronic Kidney     Disease.   Anion gap 11  5 - 15    Ct Head Wo Contrast  10/07/2013   CLINICAL DATA:  Syncope.  Hit back of head.  EXAM: CT HEAD WITHOUT CONTRAST  TECHNIQUE: Contiguous axial images were obtained from the base of the skull  through the vertex without intravenous contrast.  COMPARISON:  11/20/2010  FINDINGS: Ventricles are normal in configuration. There is ventricular and sulcal enlargement reflecting moderate atrophy. There are old infarcts, 1 along the left lateral frontal lobe another involving the right parietal lobe, stable. Other patchy areas of white matter hypoattenuation noted consistent with moderate chronic microvascular ischemic change. An old white matter lacune infarct is noted in the posterior right frontal lobe.  There are no parenchymal masses or mass effect. No evidence of a recent cortical infarct.  There are no extra-axial masses or abnormal fluid collections.  No intracranial hemorrhage.  No skull fracture. Small fluid level in the posterior left maxillary sinus. Sinuses otherwise essentially clear. Clear mastoid air cells.  IMPRESSION: 1. No acute intracranial abnormalities. 2. Atrophy, chronic microvascular ischemic change and old infarcts.   Electronically Signed   By: Lajean Manes M.D.   On: 10/07/2013 15:23   US Carotid Bilateral  10/08/2013   CLINICAL DATA:  Syncope  EXAM: BILATERAL CAROTID DUPLEX ULTRASOUND  TECHNIQUE: Pearline Cables scale imaging, color Doppler and duplex ultrasound were performed of bilateral carotid and vertebral arteries in the neck.  COMPARISON:  None.  FINDINGS: Criteria: Quantification of carotid stenosis is based on velocity parameters that correlate the residual internal carotid diameter with NASCET-based stenosis levels, using the diameter of the distal internal carotid lumen as the denominator for stenosis measurement.  The following velocity measurements were obtained:  RIGHT  ICA:  133 cm/sec  CCA:  84 cm/sec  SYSTOLIC ICA/CCA RATIO:  1.6  DIASTOLIC ICA/CCA RATIO:  ECA:  183 cm/sec  LEFT  ICA:  102 cm/sec  CCA:  75 cm/sec  SYSTOLIC ICA/CCA RATIO:  1.4  DIASTOLIC ICA/CCA RATIO:  ECA:  97 cm/sec  RIGHT CAROTID ARTERY: Moderate calcified plaque in the bulb. Low resistance internal carotid  Doppler pattern.  RIGHT VERTEBRAL ARTERY:  Antegrade.  Low resistance Doppler pattern.  LEFT CAROTID ARTERY: Mild plaque in the bulb. Low resistance internal carotid Doppler pattern.  LEFT VERTEBRAL ARTERY:  Antegrade.  Normal Doppler pattern.  IMPRESSION: 50-69% stenosis in the right internal carotid artery.  Less than 50% stenosis in the left internal carotid artery.   Electronically  Signed   By: Maryclare Bean M.D.   On: 10/08/2013 10:29   Dg Chest Portable 1 View  10/07/2013   CLINICAL DATA:  Arm pain.  Fell today.  EXAM: PORTABLE CHEST - 1 VIEW  COMPARISON:  11/23/2010  FINDINGS: Cardiac silhouette is normal in size. Normal mediastinal and hilar contours.  Clear lungs.  No pleural effusion or pneumothorax.  Old fracture of the right humerus has been reduced with external fixation. It is healed.  IMPRESSION: No acute cardiopulmonary disease   Electronically Signed   By: Lajean Manes M.D.   On: 10/07/2013 12:13        Catie Chiao A. Merlene Laughter, M.D.  Diplomate, Tax adviser of Psychiatry and Neurology ( Neurology). 10/09/2013, 10:05 AM

## 2013-10-09 NOTE — Progress Notes (Signed)
Patient with orders to be discharge home with home health. Discharge instructions given, patient and caregiver verbalized understanding. Patient stable. Patient left with caregiver in private vehicle.

## 2013-10-09 NOTE — Progress Notes (Signed)
She says she feels okay. She is crying and emotionally upset and says she wants to go home. She has not had any orthostatic changes documented. She has not had any presyncopal episodes and I think it probably was a vasovagal. No cardiac arrhythmias. Carotid study does not show a surgical lesion but she does have some blockage. Her echocardiogram does not show heart failure and no significant aortic stenosis  I think she's okay to be discharged and followed as an outpatient.

## 2013-10-09 NOTE — Progress Notes (Signed)
ANTICOAGULATION CONSULT NOTE - follow up  Pharmacy Consult for Lovenox Indication: VTE prophylaxis  No Known Allergies  Patient Measurements: Height: 5\' 3"  (160 cm) Weight: 140 lb 14 oz (63.9 kg) IBW/kg (Calculated) : 52.4  Vital Signs: Temp: 98.9 F (37.2 C) (07/21 0519) Temp src: Oral (07/21 0519) BP: 114/58 mmHg (07/21 0519) Pulse Rate: 77 (07/21 0519)  Labs:  Recent Labs  10/07/13 1155 10/08/13 0659  HGB 10.5* 9.0*  HCT 31.8* 27.9*  PLT 312 274  CREATININE 1.40* 1.26*  TROPONINI <0.30  --    Estimated Creatinine Clearance: 26.7 ml/min (by C-G formula based on Cr of 1.26).  Medical History: Past Medical History  Diagnosis Date  . Hypertension   . Blind left eye   . Glaucoma   . Hearing loss   . CVA (cerebral vascular accident)     speech problem   Medications:  Scheduled:  . amLODipine  5 mg Oral Daily  . aspirin EC  81 mg Oral Daily  . clopidogrel  75 mg Oral Daily  . enoxaparin (LOVENOX) injection  30 mg Subcutaneous Q24H  . furosemide  40 mg Oral Daily  . gabapentin  100 mg Oral TID  . gatifloxacin  1 drop Both Eyes QID  . losartan  100 mg Oral Daily  . metoprolol tartrate  25 mg Oral BID  . timolol  1 drop Right Eye BID  Assessment: 89 yoF admitted s/p fall. CBC reviewed.  No bleeding noted.  Scr elevated above patient's baseline.  Estimated CrCl ~ 20-9025ml/min.   Goal of Therapy:  Monitor platelets by anticoagulation protocol: Yes   Plan:  Lovenox 30mg  sq daily Monitor renal fxn & CBC Pharmacy will sign off.  Margo AyeHall, Jannifer Fischler A 10/09/2013,10:07 AM

## 2013-10-11 DIAGNOSIS — Z9181 History of falling: Secondary | ICD-10-CM | POA: Diagnosis not present

## 2013-10-11 DIAGNOSIS — S50909A Unspecified superficial injury of unspecified elbow, initial encounter: Secondary | ICD-10-CM | POA: Diagnosis not present

## 2013-10-11 DIAGNOSIS — I10 Essential (primary) hypertension: Secondary | ICD-10-CM | POA: Diagnosis not present

## 2013-10-11 DIAGNOSIS — S70919A Unspecified superficial injury of unspecified hip, initial encounter: Secondary | ICD-10-CM | POA: Diagnosis not present

## 2013-10-11 DIAGNOSIS — R269 Unspecified abnormalities of gait and mobility: Secondary | ICD-10-CM | POA: Diagnosis not present

## 2013-10-11 DIAGNOSIS — S80929A Unspecified superficial injury of unspecified lower leg, initial encounter: Secondary | ICD-10-CM | POA: Diagnosis not present

## 2013-10-11 DIAGNOSIS — H5440 Blindness, one eye, unspecified eye: Secondary | ICD-10-CM | POA: Diagnosis not present

## 2013-10-11 DIAGNOSIS — S60919A Unspecified superficial injury of unspecified wrist, initial encounter: Secondary | ICD-10-CM | POA: Diagnosis not present

## 2013-10-11 NOTE — Progress Notes (Signed)
UR chart review completed.  

## 2013-10-12 DIAGNOSIS — S70919A Unspecified superficial injury of unspecified hip, initial encounter: Secondary | ICD-10-CM | POA: Diagnosis not present

## 2013-10-12 DIAGNOSIS — Z9181 History of falling: Secondary | ICD-10-CM | POA: Diagnosis not present

## 2013-10-12 DIAGNOSIS — H5440 Blindness, one eye, unspecified eye: Secondary | ICD-10-CM | POA: Diagnosis not present

## 2013-10-12 DIAGNOSIS — R269 Unspecified abnormalities of gait and mobility: Secondary | ICD-10-CM | POA: Diagnosis not present

## 2013-10-12 DIAGNOSIS — I10 Essential (primary) hypertension: Secondary | ICD-10-CM | POA: Diagnosis not present

## 2013-10-12 DIAGNOSIS — S50909A Unspecified superficial injury of unspecified elbow, initial encounter: Secondary | ICD-10-CM | POA: Diagnosis not present

## 2013-10-13 DIAGNOSIS — Z9181 History of falling: Secondary | ICD-10-CM | POA: Diagnosis not present

## 2013-10-13 DIAGNOSIS — S70919A Unspecified superficial injury of unspecified hip, initial encounter: Secondary | ICD-10-CM | POA: Diagnosis not present

## 2013-10-13 DIAGNOSIS — I10 Essential (primary) hypertension: Secondary | ICD-10-CM | POA: Diagnosis not present

## 2013-10-13 DIAGNOSIS — H5440 Blindness, one eye, unspecified eye: Secondary | ICD-10-CM | POA: Diagnosis not present

## 2013-10-13 DIAGNOSIS — R269 Unspecified abnormalities of gait and mobility: Secondary | ICD-10-CM | POA: Diagnosis not present

## 2013-10-13 DIAGNOSIS — S50909A Unspecified superficial injury of unspecified elbow, initial encounter: Secondary | ICD-10-CM | POA: Diagnosis not present

## 2013-10-13 DIAGNOSIS — S60919A Unspecified superficial injury of unspecified wrist, initial encounter: Secondary | ICD-10-CM | POA: Diagnosis not present

## 2013-10-13 NOTE — Discharge Summary (Signed)
Physician Discharge Summary  Patient ID: Carla Bonilla MRN: [id number] DOB/AGE: 1920-07-11 80 y.o. Primary Care Physician:Alanny Rivers L, MD Admit date: 10/07/2013 Discharge date: 10/13/2013    Discharge Diagnoses:   Active Problems:   Syncope  hypertension Glaucoma Lumbar spinal stenosis Multiple falls History of stroke with residual effects Osteoporosis    Medication List         alendronate 70 MG tablet  Commonly known as:  FOSAMAX  Take 70 mg by mouth once a week.     amLODipine 5 MG tablet  Commonly known as:  NORVASC  Take 5 mg by mouth daily.     aspirin EC 81 MG tablet  Take 81 mg by mouth daily.     clopidogrel 75 MG tablet  Commonly known as:  PLAVIX  Take 75 mg by mouth daily.     furosemide 40 MG tablet  Commonly known as:  LASIX  Take 40 mg by mouth daily.     gabapentin 100 MG capsule  Commonly known as:  NEURONTIN  Take 1 capsule (100 mg total) by mouth 3 (three) times daily.     losartan 100 MG tablet  Commonly known as:  COZAAR  Take 100 mg by mouth daily.     metoprolol tartrate 25 MG tablet  Commonly known as:  LOPRESSOR  Take 25 mg by mouth 2 (two) times daily.     moxifloxacin 0.5 % ophthalmic solution  Commonly known as:  VIGAMOX  Place 1 drop into the left eye 2 (two) times daily.     PROAIR HFA 108 (90 BASE) MCG/ACT inhaler  Generic drug:  albuterol  Inhale 2 puffs into the lungs every 6 (six) hours as needed for wheezing or shortness of breath.     timolol 0.25 % ophthalmic solution  Commonly known as:  TIMOPTIC  Place 1 drop into the right eye 2 (two) times daily.        Discharged Condition: Improved    Consults: Cardiology/neurology  Significant Diagnostic Studies: Ct Head Wo Contrast  10/07/2013   CLINICAL DATA:  Syncope.  Hit back of head.  EXAM: CT HEAD WITHOUT CONTRAST  TECHNIQUE: Contiguous axial images were obtained from the base of the skull through the vertex without intravenous contrast.   COMPARISON:  11/20/2010  FINDINGS: Ventricles are normal in configuration. There is ventricular and sulcal enlargement reflecting moderate atrophy. There are old infarcts, 1 along the left lateral frontal lobe another involving the right parietal lobe, stable. Other patchy areas of white matter hypoattenuation noted consistent with moderate chronic microvascular ischemic change. An old white matter lacune infarct is noted in the posterior right frontal lobe.  There are no parenchymal masses or mass effect. No evidence of a recent cortical infarct.  There are no extra-axial masses or abnormal fluid collections.  No intracranial hemorrhage.  No skull fracture. Small fluid level in the posterior left maxillary sinus. Sinuses otherwise essentially clear. Clear mastoid air cells.  IMPRESSION: 1. No acute intracranial abnormalities. 2. Atrophy, chronic microvascular ischemic change and old infarcts.   Electronically Signed   By: Amie Portland M.D.   On: 10/07/2013 15:23   US Carotid Bilateral  10/08/2013   CLINICAL DATA:  Syncope  EXAM: BILATERAL CAROTID DUPLEX ULTRASOUND  TECHNIQUE: Wallace Cullens scale imaging, color Doppler and duplex ultrasound were performed of bilateral carotid and vertebral arteries in the neck.  COMPARISON:  None.  FINDINGS: Criteria: Quantification of carotid stenosis is based on velocity parameters that correlate the residual internal carotid  diameter with NASCET-based stenosis levels, using the diameter of the distal internal carotid lumen as the denominator for stenosis measurement.  The following velocity measurements were obtained:  RIGHT  ICA:  133 cm/sec  CCA:  84 cm/sec  SYSTOLIC ICA/CCA RATIO:  1.6  DIASTOLIC ICA/CCA RATIO:  ECA:  183 cm/sec  LEFT  ICA:  102 cm/sec  CCA:  75 cm/sec  SYSTOLIC ICA/CCA RATIO:  1.4  DIASTOLIC ICA/CCA RATIO:  ECA:  97 cm/sec  RIGHT CAROTID ARTERY: Moderate calcified plaque in the bulb. Low resistance internal carotid Doppler pattern.  RIGHT VERTEBRAL ARTERY:   Antegrade.  Low resistance Doppler pattern.  LEFT CAROTID ARTERY: Mild plaque in the bulb. Low resistance internal carotid Doppler pattern.  LEFT VERTEBRAL ARTERY:  Antegrade.  Normal Doppler pattern.  IMPRESSION: 50-69% stenosis in the right internal carotid artery.  Less than 50% stenosis in the left internal carotid artery.   Electronically Signed   By: Maryclare Bean M.D.   On: 10/08/2013 10:29   Dg Chest Portable 1 View  10/07/2013   CLINICAL DATA:  Arm pain.  Fell today.  EXAM: PORTABLE CHEST - 1 VIEW  COMPARISON:  11/23/2010  FINDINGS: Cardiac silhouette is normal in size. Normal mediastinal and hilar contours.  Clear lungs.  No pleural effusion or pneumothorax.  Old fracture of the right humerus has been reduced with external fixation. It is healed.  IMPRESSION: No acute cardiopulmonary disease   Electronically Signed   By: Amie Portland M.D.   On: 10/07/2013 12:13    Lab Results: Basic Metabolic Panel: No results found for this basename: NA, K, CL, CO2, GLUCOSE, BUN, CREATININE, CALCIUM, MG, PHOS,  in the last 72 hours Liver Function Tests: No results found for this basename: AST, ALT, ALKPHOS, BILITOT, PROT, ALBUMIN,  in the last 72 hours   CBC: No results found for this basename: WBC, NEUTROABS, HGB, HCT, MCV, PLT,  in the last 72 hours  No results found for this or any previous visit (from the past 240 hour(s)).   Hospital Course: This is a 80 year old who was at home in her usual state of health when she fell striking her leg. She then had a brief episode of unconsciousness. This appears to have been related to pain and bleeding. This was witnessed by EMS. She did not have a post ictal period when she awoke she was back to baseline. At her baseline she has had a severe stroke that has left her with significant difficulty with speech and difficulty communicating. She was treated with IV fluids. She had orthostatic vital signs checked which were negative for significant drop. She had CT of  the brain echocardiogram and carotid evaluation. She had cardiology and neurology consultations it was felt that she probably had a vasovagal episode causing her to have  Syncope. She was significantly uncomfortable being in the hospital and since her workup was negative she was discharged.  Discharge Exam: Blood pressure 113/70, pulse 70, temperature 98.1 F (36.7 C), temperature source Oral, resp. rate 18, height 5\' 3"  (1.6 m), weight 63.9 kg (140 lb 14 oz), SpO2 97.00%. Her speech is difficult to understand. Her chest is clear. Her heart is regular.  Disposition: Home with home health services      Discharge Instructions   Discharge patient    Complete by:  As directed      Face-to-face encounter (required for Medicare/Medicaid patients)    Complete by:  As directed   I Jacquita Mulhearn L certify that this  patient is under my care and that I, or a nurse practitioner or physician's assistant working with me, had a face-to-face encounter that meets the physician face-to-face encounter requirements with this patient on 10/09/2013. The encounter with the patient was in whole, or in part for the following medical condition(s) which is the primary reason for home health care (List medical condition): Syncope  The encounter with the patient was in whole, or in part, for the following medical condition, which is the primary reason for home health care:  Syncope  I certify that, based on my findings, the following services are medically necessary home health services:   Nursing Physical therapy    My clinical findings support the need for the above services:  Unable to leave home safely without assistance and/or assistive device  Further, I certify that my clinical findings support that this patient is homebound due to:  Unable to leave home safely without assistance  Reason for Medically Necessary Home Health Services:  Skilled Nursing- Change/Decline in Patient Status     Home Health    Complete by:   As directed   To provide the following care/treatments:   PT RN             Follow-up Information   Follow up with [facility].   Contact information:   [address] [place] [id number] [phone]       Signed: Fredirick MaudlinHAWKINS,Seanne Chirico L   10/13/2013, 8:32 AM

## 2013-10-15 DIAGNOSIS — S70919A Unspecified superficial injury of unspecified hip, initial encounter: Secondary | ICD-10-CM | POA: Diagnosis not present

## 2013-10-15 DIAGNOSIS — I10 Essential (primary) hypertension: Secondary | ICD-10-CM | POA: Diagnosis not present

## 2013-10-15 DIAGNOSIS — S50909A Unspecified superficial injury of unspecified elbow, initial encounter: Secondary | ICD-10-CM | POA: Diagnosis not present

## 2013-10-15 DIAGNOSIS — R269 Unspecified abnormalities of gait and mobility: Secondary | ICD-10-CM | POA: Diagnosis not present

## 2013-10-15 DIAGNOSIS — Z9181 History of falling: Secondary | ICD-10-CM | POA: Diagnosis not present

## 2013-10-15 DIAGNOSIS — H5440 Blindness, one eye, unspecified eye: Secondary | ICD-10-CM | POA: Diagnosis not present

## 2013-10-16 DIAGNOSIS — Z9181 History of falling: Secondary | ICD-10-CM | POA: Diagnosis not present

## 2013-10-16 DIAGNOSIS — I10 Essential (primary) hypertension: Secondary | ICD-10-CM | POA: Diagnosis not present

## 2013-10-16 DIAGNOSIS — S60919A Unspecified superficial injury of unspecified wrist, initial encounter: Secondary | ICD-10-CM | POA: Diagnosis not present

## 2013-10-16 DIAGNOSIS — R269 Unspecified abnormalities of gait and mobility: Secondary | ICD-10-CM | POA: Diagnosis not present

## 2013-10-16 DIAGNOSIS — S50909A Unspecified superficial injury of unspecified elbow, initial encounter: Secondary | ICD-10-CM | POA: Diagnosis not present

## 2013-10-16 DIAGNOSIS — H5440 Blindness, one eye, unspecified eye: Secondary | ICD-10-CM | POA: Diagnosis not present

## 2013-10-16 DIAGNOSIS — S70919A Unspecified superficial injury of unspecified hip, initial encounter: Secondary | ICD-10-CM | POA: Diagnosis not present

## 2013-10-18 DIAGNOSIS — S60919A Unspecified superficial injury of unspecified wrist, initial encounter: Secondary | ICD-10-CM | POA: Diagnosis not present

## 2013-10-18 DIAGNOSIS — S50909A Unspecified superficial injury of unspecified elbow, initial encounter: Secondary | ICD-10-CM | POA: Diagnosis not present

## 2013-10-18 DIAGNOSIS — Z9181 History of falling: Secondary | ICD-10-CM | POA: Diagnosis not present

## 2013-10-18 DIAGNOSIS — S70919A Unspecified superficial injury of unspecified hip, initial encounter: Secondary | ICD-10-CM | POA: Diagnosis not present

## 2013-10-18 DIAGNOSIS — R269 Unspecified abnormalities of gait and mobility: Secondary | ICD-10-CM | POA: Diagnosis not present

## 2013-10-18 DIAGNOSIS — I10 Essential (primary) hypertension: Secondary | ICD-10-CM | POA: Diagnosis not present

## 2013-10-18 DIAGNOSIS — H5440 Blindness, one eye, unspecified eye: Secondary | ICD-10-CM | POA: Diagnosis not present

## 2013-10-18 DIAGNOSIS — S70929A Unspecified superficial injury of unspecified thigh, initial encounter: Secondary | ICD-10-CM | POA: Diagnosis not present

## 2013-10-21 DIAGNOSIS — H5440 Blindness, one eye, unspecified eye: Secondary | ICD-10-CM | POA: Diagnosis not present

## 2013-10-21 DIAGNOSIS — Z9181 History of falling: Secondary | ICD-10-CM | POA: Diagnosis not present

## 2013-10-21 DIAGNOSIS — I10 Essential (primary) hypertension: Secondary | ICD-10-CM | POA: Diagnosis not present

## 2013-10-21 DIAGNOSIS — S70919A Unspecified superficial injury of unspecified hip, initial encounter: Secondary | ICD-10-CM | POA: Diagnosis not present

## 2013-10-21 DIAGNOSIS — R269 Unspecified abnormalities of gait and mobility: Secondary | ICD-10-CM | POA: Diagnosis not present

## 2013-10-21 DIAGNOSIS — S50909A Unspecified superficial injury of unspecified elbow, initial encounter: Secondary | ICD-10-CM | POA: Diagnosis not present

## 2013-10-22 DIAGNOSIS — R269 Unspecified abnormalities of gait and mobility: Secondary | ICD-10-CM | POA: Diagnosis not present

## 2013-10-22 DIAGNOSIS — S70919A Unspecified superficial injury of unspecified hip, initial encounter: Secondary | ICD-10-CM | POA: Diagnosis not present

## 2013-10-22 DIAGNOSIS — Z9181 History of falling: Secondary | ICD-10-CM | POA: Diagnosis not present

## 2013-10-22 DIAGNOSIS — S50909A Unspecified superficial injury of unspecified elbow, initial encounter: Secondary | ICD-10-CM | POA: Diagnosis not present

## 2013-10-22 DIAGNOSIS — H5440 Blindness, one eye, unspecified eye: Secondary | ICD-10-CM | POA: Diagnosis not present

## 2013-10-22 DIAGNOSIS — I10 Essential (primary) hypertension: Secondary | ICD-10-CM | POA: Diagnosis not present

## 2013-10-24 DIAGNOSIS — S70919A Unspecified superficial injury of unspecified hip, initial encounter: Secondary | ICD-10-CM | POA: Diagnosis not present

## 2013-10-24 DIAGNOSIS — Z9181 History of falling: Secondary | ICD-10-CM | POA: Diagnosis not present

## 2013-10-24 DIAGNOSIS — S90919A Unspecified superficial injury of unspecified ankle, initial encounter: Secondary | ICD-10-CM | POA: Diagnosis not present

## 2013-10-24 DIAGNOSIS — H5440 Blindness, one eye, unspecified eye: Secondary | ICD-10-CM | POA: Diagnosis not present

## 2013-10-24 DIAGNOSIS — R269 Unspecified abnormalities of gait and mobility: Secondary | ICD-10-CM | POA: Diagnosis not present

## 2013-10-24 DIAGNOSIS — I10 Essential (primary) hypertension: Secondary | ICD-10-CM | POA: Diagnosis not present

## 2013-10-24 DIAGNOSIS — S50909A Unspecified superficial injury of unspecified elbow, initial encounter: Secondary | ICD-10-CM | POA: Diagnosis not present

## 2013-10-25 DIAGNOSIS — I6789 Other cerebrovascular disease: Secondary | ICD-10-CM | POA: Diagnosis not present

## 2013-10-25 DIAGNOSIS — I10 Essential (primary) hypertension: Secondary | ICD-10-CM | POA: Diagnosis not present

## 2013-10-25 DIAGNOSIS — H409 Unspecified glaucoma: Secondary | ICD-10-CM | POA: Diagnosis not present

## 2013-10-26 DIAGNOSIS — I10 Essential (primary) hypertension: Secondary | ICD-10-CM | POA: Diagnosis not present

## 2013-10-26 DIAGNOSIS — H5440 Blindness, one eye, unspecified eye: Secondary | ICD-10-CM | POA: Diagnosis not present

## 2013-10-26 DIAGNOSIS — Z9181 History of falling: Secondary | ICD-10-CM | POA: Diagnosis not present

## 2013-10-26 DIAGNOSIS — S80929A Unspecified superficial injury of unspecified lower leg, initial encounter: Secondary | ICD-10-CM | POA: Diagnosis not present

## 2013-10-26 DIAGNOSIS — S50909A Unspecified superficial injury of unspecified elbow, initial encounter: Secondary | ICD-10-CM | POA: Diagnosis not present

## 2013-10-26 DIAGNOSIS — R269 Unspecified abnormalities of gait and mobility: Secondary | ICD-10-CM | POA: Diagnosis not present

## 2013-10-26 DIAGNOSIS — S70919A Unspecified superficial injury of unspecified hip, initial encounter: Secondary | ICD-10-CM | POA: Diagnosis not present

## 2013-10-29 DIAGNOSIS — Z9181 History of falling: Secondary | ICD-10-CM | POA: Diagnosis not present

## 2013-10-29 DIAGNOSIS — S80929A Unspecified superficial injury of unspecified lower leg, initial encounter: Secondary | ICD-10-CM | POA: Diagnosis not present

## 2013-10-29 DIAGNOSIS — S50919A Unspecified superficial injury of unspecified forearm, initial encounter: Secondary | ICD-10-CM | POA: Diagnosis not present

## 2013-10-29 DIAGNOSIS — S50909A Unspecified superficial injury of unspecified elbow, initial encounter: Secondary | ICD-10-CM | POA: Diagnosis not present

## 2013-10-29 DIAGNOSIS — R269 Unspecified abnormalities of gait and mobility: Secondary | ICD-10-CM | POA: Diagnosis not present

## 2013-10-29 DIAGNOSIS — S70919A Unspecified superficial injury of unspecified hip, initial encounter: Secondary | ICD-10-CM | POA: Diagnosis not present

## 2013-10-29 DIAGNOSIS — H5440 Blindness, one eye, unspecified eye: Secondary | ICD-10-CM | POA: Diagnosis not present

## 2013-10-29 DIAGNOSIS — I10 Essential (primary) hypertension: Secondary | ICD-10-CM | POA: Diagnosis not present

## 2013-10-30 DIAGNOSIS — R269 Unspecified abnormalities of gait and mobility: Secondary | ICD-10-CM | POA: Diagnosis not present

## 2013-10-30 DIAGNOSIS — I10 Essential (primary) hypertension: Secondary | ICD-10-CM | POA: Diagnosis not present

## 2013-10-30 DIAGNOSIS — Z9181 History of falling: Secondary | ICD-10-CM | POA: Diagnosis not present

## 2013-10-30 DIAGNOSIS — H5440 Blindness, one eye, unspecified eye: Secondary | ICD-10-CM | POA: Diagnosis not present

## 2013-10-30 DIAGNOSIS — S70919A Unspecified superficial injury of unspecified hip, initial encounter: Secondary | ICD-10-CM | POA: Diagnosis not present

## 2013-10-30 DIAGNOSIS — S50909A Unspecified superficial injury of unspecified elbow, initial encounter: Secondary | ICD-10-CM | POA: Diagnosis not present

## 2013-10-31 DIAGNOSIS — I69920 Aphasia following unspecified cerebrovascular disease: Secondary | ICD-10-CM | POA: Diagnosis not present

## 2013-10-31 DIAGNOSIS — Z7982 Long term (current) use of aspirin: Secondary | ICD-10-CM | POA: Diagnosis not present

## 2013-10-31 DIAGNOSIS — IMO0002 Reserved for concepts with insufficient information to code with codable children: Secondary | ICD-10-CM | POA: Diagnosis not present

## 2013-10-31 DIAGNOSIS — I69998 Other sequelae following unspecified cerebrovascular disease: Secondary | ICD-10-CM | POA: Diagnosis not present

## 2013-10-31 DIAGNOSIS — R490 Dysphonia: Secondary | ICD-10-CM | POA: Diagnosis not present

## 2013-10-31 DIAGNOSIS — L97809 Non-pressure chronic ulcer of other part of unspecified lower leg with unspecified severity: Secondary | ICD-10-CM | POA: Diagnosis not present

## 2013-10-31 DIAGNOSIS — Z79899 Other long term (current) drug therapy: Secondary | ICD-10-CM | POA: Diagnosis not present

## 2013-10-31 DIAGNOSIS — I83009 Varicose veins of unspecified lower extremity with ulcer of unspecified site: Secondary | ICD-10-CM | POA: Diagnosis not present

## 2013-11-02 DIAGNOSIS — R269 Unspecified abnormalities of gait and mobility: Secondary | ICD-10-CM | POA: Diagnosis not present

## 2013-11-02 DIAGNOSIS — L97809 Non-pressure chronic ulcer of other part of unspecified lower leg with unspecified severity: Secondary | ICD-10-CM | POA: Diagnosis not present

## 2013-11-02 DIAGNOSIS — L97209 Non-pressure chronic ulcer of unspecified calf with unspecified severity: Secondary | ICD-10-CM | POA: Diagnosis not present

## 2013-11-02 DIAGNOSIS — I10 Essential (primary) hypertension: Secondary | ICD-10-CM | POA: Diagnosis not present

## 2013-11-02 DIAGNOSIS — S50909A Unspecified superficial injury of unspecified elbow, initial encounter: Secondary | ICD-10-CM | POA: Diagnosis not present

## 2013-11-02 DIAGNOSIS — H5440 Blindness, one eye, unspecified eye: Secondary | ICD-10-CM | POA: Diagnosis not present

## 2013-11-02 DIAGNOSIS — I872 Venous insufficiency (chronic) (peripheral): Secondary | ICD-10-CM | POA: Diagnosis not present

## 2013-11-02 DIAGNOSIS — Z9181 History of falling: Secondary | ICD-10-CM | POA: Diagnosis not present

## 2013-11-02 DIAGNOSIS — I739 Peripheral vascular disease, unspecified: Secondary | ICD-10-CM | POA: Diagnosis not present

## 2013-11-02 DIAGNOSIS — S70919A Unspecified superficial injury of unspecified hip, initial encounter: Secondary | ICD-10-CM | POA: Diagnosis not present

## 2013-11-02 DIAGNOSIS — S60919A Unspecified superficial injury of unspecified wrist, initial encounter: Secondary | ICD-10-CM | POA: Diagnosis not present

## 2013-11-05 DIAGNOSIS — H5440 Blindness, one eye, unspecified eye: Secondary | ICD-10-CM | POA: Diagnosis not present

## 2013-11-05 DIAGNOSIS — R269 Unspecified abnormalities of gait and mobility: Secondary | ICD-10-CM | POA: Diagnosis not present

## 2013-11-05 DIAGNOSIS — S60919A Unspecified superficial injury of unspecified wrist, initial encounter: Secondary | ICD-10-CM | POA: Diagnosis not present

## 2013-11-05 DIAGNOSIS — S50909A Unspecified superficial injury of unspecified elbow, initial encounter: Secondary | ICD-10-CM | POA: Diagnosis not present

## 2013-11-05 DIAGNOSIS — S70919A Unspecified superficial injury of unspecified hip, initial encounter: Secondary | ICD-10-CM | POA: Diagnosis not present

## 2013-11-05 DIAGNOSIS — S90919A Unspecified superficial injury of unspecified ankle, initial encounter: Secondary | ICD-10-CM | POA: Diagnosis not present

## 2013-11-05 DIAGNOSIS — Z9181 History of falling: Secondary | ICD-10-CM | POA: Diagnosis not present

## 2013-11-05 DIAGNOSIS — I10 Essential (primary) hypertension: Secondary | ICD-10-CM | POA: Diagnosis not present

## 2013-11-06 DIAGNOSIS — S50909A Unspecified superficial injury of unspecified elbow, initial encounter: Secondary | ICD-10-CM | POA: Diagnosis not present

## 2013-11-06 DIAGNOSIS — S50919A Unspecified superficial injury of unspecified forearm, initial encounter: Secondary | ICD-10-CM | POA: Diagnosis not present

## 2013-11-06 DIAGNOSIS — H5440 Blindness, one eye, unspecified eye: Secondary | ICD-10-CM | POA: Diagnosis not present

## 2013-11-06 DIAGNOSIS — R269 Unspecified abnormalities of gait and mobility: Secondary | ICD-10-CM | POA: Diagnosis not present

## 2013-11-06 DIAGNOSIS — I10 Essential (primary) hypertension: Secondary | ICD-10-CM | POA: Diagnosis not present

## 2013-11-06 DIAGNOSIS — Z9181 History of falling: Secondary | ICD-10-CM | POA: Diagnosis not present

## 2013-11-06 DIAGNOSIS — S70919A Unspecified superficial injury of unspecified hip, initial encounter: Secondary | ICD-10-CM | POA: Diagnosis not present

## 2013-11-07 DIAGNOSIS — I83009 Varicose veins of unspecified lower extremity with ulcer of unspecified site: Secondary | ICD-10-CM | POA: Diagnosis not present

## 2013-11-07 DIAGNOSIS — L97909 Non-pressure chronic ulcer of unspecified part of unspecified lower leg with unspecified severity: Secondary | ICD-10-CM | POA: Diagnosis not present

## 2013-11-07 DIAGNOSIS — Z7982 Long term (current) use of aspirin: Secondary | ICD-10-CM | POA: Diagnosis not present

## 2013-11-07 DIAGNOSIS — IMO0002 Reserved for concepts with insufficient information to code with codable children: Secondary | ICD-10-CM | POA: Diagnosis not present

## 2013-11-07 DIAGNOSIS — I69998 Other sequelae following unspecified cerebrovascular disease: Secondary | ICD-10-CM | POA: Diagnosis not present

## 2013-11-07 DIAGNOSIS — I69920 Aphasia following unspecified cerebrovascular disease: Secondary | ICD-10-CM | POA: Diagnosis not present

## 2013-11-07 DIAGNOSIS — L97809 Non-pressure chronic ulcer of other part of unspecified lower leg with unspecified severity: Secondary | ICD-10-CM | POA: Diagnosis not present

## 2013-11-07 DIAGNOSIS — Z79899 Other long term (current) drug therapy: Secondary | ICD-10-CM | POA: Diagnosis not present

## 2013-11-07 DIAGNOSIS — R490 Dysphonia: Secondary | ICD-10-CM | POA: Diagnosis not present

## 2013-11-13 DIAGNOSIS — I10 Essential (primary) hypertension: Secondary | ICD-10-CM | POA: Diagnosis not present

## 2013-11-13 DIAGNOSIS — R269 Unspecified abnormalities of gait and mobility: Secondary | ICD-10-CM | POA: Diagnosis not present

## 2013-11-13 DIAGNOSIS — H5440 Blindness, one eye, unspecified eye: Secondary | ICD-10-CM | POA: Diagnosis not present

## 2013-11-13 DIAGNOSIS — S60919A Unspecified superficial injury of unspecified wrist, initial encounter: Secondary | ICD-10-CM | POA: Diagnosis not present

## 2013-11-13 DIAGNOSIS — S50909A Unspecified superficial injury of unspecified elbow, initial encounter: Secondary | ICD-10-CM | POA: Diagnosis not present

## 2013-11-13 DIAGNOSIS — Z9181 History of falling: Secondary | ICD-10-CM | POA: Diagnosis not present

## 2013-11-13 DIAGNOSIS — S70919A Unspecified superficial injury of unspecified hip, initial encounter: Secondary | ICD-10-CM | POA: Diagnosis not present

## 2013-11-14 DIAGNOSIS — L97909 Non-pressure chronic ulcer of unspecified part of unspecified lower leg with unspecified severity: Secondary | ICD-10-CM | POA: Diagnosis not present

## 2013-11-14 DIAGNOSIS — L97809 Non-pressure chronic ulcer of other part of unspecified lower leg with unspecified severity: Secondary | ICD-10-CM | POA: Diagnosis not present

## 2013-11-14 DIAGNOSIS — I83009 Varicose veins of unspecified lower extremity with ulcer of unspecified site: Secondary | ICD-10-CM | POA: Diagnosis not present

## 2013-11-14 DIAGNOSIS — Z7982 Long term (current) use of aspirin: Secondary | ICD-10-CM | POA: Diagnosis not present

## 2013-11-14 DIAGNOSIS — I69920 Aphasia following unspecified cerebrovascular disease: Secondary | ICD-10-CM | POA: Diagnosis not present

## 2013-11-14 DIAGNOSIS — R490 Dysphonia: Secondary | ICD-10-CM | POA: Diagnosis not present

## 2013-11-14 DIAGNOSIS — IMO0002 Reserved for concepts with insufficient information to code with codable children: Secondary | ICD-10-CM | POA: Diagnosis not present

## 2013-11-14 DIAGNOSIS — I69998 Other sequelae following unspecified cerebrovascular disease: Secondary | ICD-10-CM | POA: Diagnosis not present

## 2013-11-19 DIAGNOSIS — S70919A Unspecified superficial injury of unspecified hip, initial encounter: Secondary | ICD-10-CM | POA: Diagnosis not present

## 2013-11-19 DIAGNOSIS — S70929A Unspecified superficial injury of unspecified thigh, initial encounter: Secondary | ICD-10-CM | POA: Diagnosis not present

## 2013-11-19 DIAGNOSIS — S50909A Unspecified superficial injury of unspecified elbow, initial encounter: Secondary | ICD-10-CM | POA: Diagnosis not present

## 2013-11-19 DIAGNOSIS — H5440 Blindness, one eye, unspecified eye: Secondary | ICD-10-CM | POA: Diagnosis not present

## 2013-11-19 DIAGNOSIS — Z9181 History of falling: Secondary | ICD-10-CM | POA: Diagnosis not present

## 2013-11-19 DIAGNOSIS — I10 Essential (primary) hypertension: Secondary | ICD-10-CM | POA: Diagnosis not present

## 2013-11-19 DIAGNOSIS — R269 Unspecified abnormalities of gait and mobility: Secondary | ICD-10-CM | POA: Diagnosis not present

## 2013-11-20 DIAGNOSIS — S60919A Unspecified superficial injury of unspecified wrist, initial encounter: Secondary | ICD-10-CM | POA: Diagnosis not present

## 2013-11-20 DIAGNOSIS — I10 Essential (primary) hypertension: Secondary | ICD-10-CM | POA: Diagnosis not present

## 2013-11-20 DIAGNOSIS — R269 Unspecified abnormalities of gait and mobility: Secondary | ICD-10-CM | POA: Diagnosis not present

## 2013-11-20 DIAGNOSIS — H5440 Blindness, one eye, unspecified eye: Secondary | ICD-10-CM | POA: Diagnosis not present

## 2013-11-20 DIAGNOSIS — S50909A Unspecified superficial injury of unspecified elbow, initial encounter: Secondary | ICD-10-CM | POA: Diagnosis not present

## 2013-11-20 DIAGNOSIS — S70919A Unspecified superficial injury of unspecified hip, initial encounter: Secondary | ICD-10-CM | POA: Diagnosis not present

## 2013-11-20 DIAGNOSIS — Z9181 History of falling: Secondary | ICD-10-CM | POA: Diagnosis not present

## 2013-11-23 DIAGNOSIS — S60919A Unspecified superficial injury of unspecified wrist, initial encounter: Secondary | ICD-10-CM | POA: Diagnosis not present

## 2013-11-23 DIAGNOSIS — S50909A Unspecified superficial injury of unspecified elbow, initial encounter: Secondary | ICD-10-CM | POA: Diagnosis not present

## 2013-11-23 DIAGNOSIS — S70919A Unspecified superficial injury of unspecified hip, initial encounter: Secondary | ICD-10-CM | POA: Diagnosis not present

## 2013-11-23 DIAGNOSIS — S80929A Unspecified superficial injury of unspecified lower leg, initial encounter: Secondary | ICD-10-CM | POA: Diagnosis not present

## 2013-11-23 DIAGNOSIS — H5440 Blindness, one eye, unspecified eye: Secondary | ICD-10-CM | POA: Diagnosis not present

## 2013-11-23 DIAGNOSIS — R269 Unspecified abnormalities of gait and mobility: Secondary | ICD-10-CM | POA: Diagnosis not present

## 2013-11-23 DIAGNOSIS — I10 Essential (primary) hypertension: Secondary | ICD-10-CM | POA: Diagnosis not present

## 2013-11-23 DIAGNOSIS — Z9181 History of falling: Secondary | ICD-10-CM | POA: Diagnosis not present

## 2013-11-26 DIAGNOSIS — Z9181 History of falling: Secondary | ICD-10-CM | POA: Diagnosis not present

## 2013-11-26 DIAGNOSIS — I10 Essential (primary) hypertension: Secondary | ICD-10-CM | POA: Diagnosis not present

## 2013-11-26 DIAGNOSIS — S70929A Unspecified superficial injury of unspecified thigh, initial encounter: Secondary | ICD-10-CM | POA: Diagnosis not present

## 2013-11-26 DIAGNOSIS — S70919A Unspecified superficial injury of unspecified hip, initial encounter: Secondary | ICD-10-CM | POA: Diagnosis not present

## 2013-11-26 DIAGNOSIS — H5440 Blindness, one eye, unspecified eye: Secondary | ICD-10-CM | POA: Diagnosis not present

## 2013-11-26 DIAGNOSIS — S50909A Unspecified superficial injury of unspecified elbow, initial encounter: Secondary | ICD-10-CM | POA: Diagnosis not present

## 2013-11-26 DIAGNOSIS — R269 Unspecified abnormalities of gait and mobility: Secondary | ICD-10-CM | POA: Diagnosis not present

## 2013-11-28 DIAGNOSIS — S70919A Unspecified superficial injury of unspecified hip, initial encounter: Secondary | ICD-10-CM | POA: Diagnosis not present

## 2013-11-28 DIAGNOSIS — I10 Essential (primary) hypertension: Secondary | ICD-10-CM | POA: Diagnosis not present

## 2013-11-28 DIAGNOSIS — S60919A Unspecified superficial injury of unspecified wrist, initial encounter: Secondary | ICD-10-CM | POA: Diagnosis not present

## 2013-11-28 DIAGNOSIS — R269 Unspecified abnormalities of gait and mobility: Secondary | ICD-10-CM | POA: Diagnosis not present

## 2013-11-28 DIAGNOSIS — H5440 Blindness, one eye, unspecified eye: Secondary | ICD-10-CM | POA: Diagnosis not present

## 2013-11-28 DIAGNOSIS — Z9181 History of falling: Secondary | ICD-10-CM | POA: Diagnosis not present

## 2013-11-28 DIAGNOSIS — S50909A Unspecified superficial injury of unspecified elbow, initial encounter: Secondary | ICD-10-CM | POA: Diagnosis not present

## 2013-11-29 DIAGNOSIS — Z9181 History of falling: Secondary | ICD-10-CM | POA: Diagnosis not present

## 2013-11-29 DIAGNOSIS — H5440 Blindness, one eye, unspecified eye: Secondary | ICD-10-CM | POA: Diagnosis not present

## 2013-11-29 DIAGNOSIS — S50909A Unspecified superficial injury of unspecified elbow, initial encounter: Secondary | ICD-10-CM | POA: Diagnosis not present

## 2013-11-29 DIAGNOSIS — I10 Essential (primary) hypertension: Secondary | ICD-10-CM | POA: Diagnosis not present

## 2013-11-29 DIAGNOSIS — R269 Unspecified abnormalities of gait and mobility: Secondary | ICD-10-CM | POA: Diagnosis not present

## 2013-11-29 DIAGNOSIS — S70919A Unspecified superficial injury of unspecified hip, initial encounter: Secondary | ICD-10-CM | POA: Diagnosis not present

## 2013-11-30 DIAGNOSIS — S60919A Unspecified superficial injury of unspecified wrist, initial encounter: Secondary | ICD-10-CM | POA: Diagnosis not present

## 2013-11-30 DIAGNOSIS — R269 Unspecified abnormalities of gait and mobility: Secondary | ICD-10-CM | POA: Diagnosis not present

## 2013-11-30 DIAGNOSIS — S50909A Unspecified superficial injury of unspecified elbow, initial encounter: Secondary | ICD-10-CM | POA: Diagnosis not present

## 2013-11-30 DIAGNOSIS — S90919A Unspecified superficial injury of unspecified ankle, initial encounter: Secondary | ICD-10-CM | POA: Diagnosis not present

## 2013-11-30 DIAGNOSIS — Z9181 History of falling: Secondary | ICD-10-CM | POA: Diagnosis not present

## 2013-11-30 DIAGNOSIS — H5440 Blindness, one eye, unspecified eye: Secondary | ICD-10-CM | POA: Diagnosis not present

## 2013-11-30 DIAGNOSIS — I10 Essential (primary) hypertension: Secondary | ICD-10-CM | POA: Diagnosis not present

## 2013-11-30 DIAGNOSIS — S70919A Unspecified superficial injury of unspecified hip, initial encounter: Secondary | ICD-10-CM | POA: Diagnosis not present

## 2013-12-05 DIAGNOSIS — H4011X0 Primary open-angle glaucoma, stage unspecified: Secondary | ICD-10-CM | POA: Diagnosis not present

## 2013-12-05 DIAGNOSIS — H409 Unspecified glaucoma: Secondary | ICD-10-CM | POA: Diagnosis not present

## 2013-12-06 DIAGNOSIS — S70919A Unspecified superficial injury of unspecified hip, initial encounter: Secondary | ICD-10-CM | POA: Diagnosis not present

## 2013-12-06 DIAGNOSIS — H5440 Blindness, one eye, unspecified eye: Secondary | ICD-10-CM | POA: Diagnosis not present

## 2013-12-06 DIAGNOSIS — Z9181 History of falling: Secondary | ICD-10-CM | POA: Diagnosis not present

## 2013-12-06 DIAGNOSIS — R269 Unspecified abnormalities of gait and mobility: Secondary | ICD-10-CM | POA: Diagnosis not present

## 2013-12-06 DIAGNOSIS — I10 Essential (primary) hypertension: Secondary | ICD-10-CM | POA: Diagnosis not present

## 2013-12-06 DIAGNOSIS — S50909A Unspecified superficial injury of unspecified elbow, initial encounter: Secondary | ICD-10-CM | POA: Diagnosis not present

## 2013-12-07 DIAGNOSIS — S50909A Unspecified superficial injury of unspecified elbow, initial encounter: Secondary | ICD-10-CM | POA: Diagnosis not present

## 2013-12-07 DIAGNOSIS — S70919A Unspecified superficial injury of unspecified hip, initial encounter: Secondary | ICD-10-CM | POA: Diagnosis not present

## 2013-12-07 DIAGNOSIS — H5440 Blindness, one eye, unspecified eye: Secondary | ICD-10-CM | POA: Diagnosis not present

## 2013-12-07 DIAGNOSIS — S60919A Unspecified superficial injury of unspecified wrist, initial encounter: Secondary | ICD-10-CM | POA: Diagnosis not present

## 2013-12-07 DIAGNOSIS — Z9181 History of falling: Secondary | ICD-10-CM | POA: Diagnosis not present

## 2013-12-07 DIAGNOSIS — R269 Unspecified abnormalities of gait and mobility: Secondary | ICD-10-CM | POA: Diagnosis not present

## 2013-12-07 DIAGNOSIS — I10 Essential (primary) hypertension: Secondary | ICD-10-CM | POA: Diagnosis not present

## 2013-12-07 DIAGNOSIS — S80929A Unspecified superficial injury of unspecified lower leg, initial encounter: Secondary | ICD-10-CM | POA: Diagnosis not present

## 2013-12-10 DIAGNOSIS — H5440 Blindness, one eye, unspecified eye: Secondary | ICD-10-CM | POA: Diagnosis not present

## 2013-12-10 DIAGNOSIS — Z9181 History of falling: Secondary | ICD-10-CM | POA: Diagnosis not present

## 2013-12-10 DIAGNOSIS — R269 Unspecified abnormalities of gait and mobility: Secondary | ICD-10-CM | POA: Diagnosis not present

## 2013-12-10 DIAGNOSIS — I10 Essential (primary) hypertension: Secondary | ICD-10-CM | POA: Diagnosis not present

## 2013-12-10 DIAGNOSIS — R2681 Unsteadiness on feet: Secondary | ICD-10-CM | POA: Diagnosis not present

## 2013-12-10 DIAGNOSIS — S70919A Unspecified superficial injury of unspecified hip, initial encounter: Secondary | ICD-10-CM | POA: Diagnosis not present

## 2013-12-10 DIAGNOSIS — S80921D Unspecified superficial injury of right lower leg, subsequent encounter: Secondary | ICD-10-CM | POA: Diagnosis not present

## 2013-12-10 DIAGNOSIS — S80929A Unspecified superficial injury of unspecified lower leg, initial encounter: Secondary | ICD-10-CM | POA: Diagnosis not present

## 2013-12-25 DIAGNOSIS — Z09 Encounter for follow-up examination after completed treatment for conditions other than malignant neoplasm: Secondary | ICD-10-CM | POA: Diagnosis not present

## 2013-12-25 DIAGNOSIS — L97911 Non-pressure chronic ulcer of unspecified part of right lower leg limited to breakdown of skin: Secondary | ICD-10-CM | POA: Diagnosis not present

## 2013-12-25 DIAGNOSIS — Z872 Personal history of diseases of the skin and subcutaneous tissue: Secondary | ICD-10-CM | POA: Diagnosis not present

## 2013-12-25 DIAGNOSIS — I83019 Varicose veins of right lower extremity with ulcer of unspecified site: Secondary | ICD-10-CM | POA: Diagnosis not present

## 2013-12-31 DIAGNOSIS — H5010 Unspecified exotropia: Secondary | ICD-10-CM | POA: Diagnosis not present

## 2013-12-31 DIAGNOSIS — H1132 Conjunctival hemorrhage, left eye: Secondary | ICD-10-CM | POA: Diagnosis not present

## 2014-01-01 DIAGNOSIS — H1132 Conjunctival hemorrhage, left eye: Secondary | ICD-10-CM | POA: Diagnosis not present

## 2014-01-02 DIAGNOSIS — Z23 Encounter for immunization: Secondary | ICD-10-CM | POA: Diagnosis not present

## 2014-01-02 DIAGNOSIS — I6350 Cerebral infarction due to unspecified occlusion or stenosis of unspecified cerebral artery: Secondary | ICD-10-CM | POA: Diagnosis not present

## 2014-01-02 DIAGNOSIS — H11432 Conjunctival hyperemia, left eye: Secondary | ICD-10-CM | POA: Diagnosis not present

## 2014-01-02 DIAGNOSIS — I10 Essential (primary) hypertension: Secondary | ICD-10-CM | POA: Diagnosis not present

## 2014-01-09 DIAGNOSIS — R300 Dysuria: Secondary | ICD-10-CM | POA: Diagnosis not present

## 2014-04-04 DIAGNOSIS — F209 Schizophrenia, unspecified: Secondary | ICD-10-CM | POA: Diagnosis not present

## 2014-04-04 DIAGNOSIS — I6350 Cerebral infarction due to unspecified occlusion or stenosis of unspecified cerebral artery: Secondary | ICD-10-CM | POA: Diagnosis not present

## 2014-04-04 DIAGNOSIS — I10 Essential (primary) hypertension: Secondary | ICD-10-CM | POA: Diagnosis not present

## 2014-04-06 DIAGNOSIS — I10 Essential (primary) hypertension: Secondary | ICD-10-CM | POA: Diagnosis not present

## 2014-04-06 DIAGNOSIS — Z9181 History of falling: Secondary | ICD-10-CM | POA: Diagnosis not present

## 2014-04-06 DIAGNOSIS — M6281 Muscle weakness (generalized): Secondary | ICD-10-CM | POA: Diagnosis not present

## 2014-04-06 DIAGNOSIS — I69320 Aphasia following cerebral infarction: Secondary | ICD-10-CM | POA: Diagnosis not present

## 2014-04-08 DIAGNOSIS — M6281 Muscle weakness (generalized): Secondary | ICD-10-CM | POA: Diagnosis not present

## 2014-04-08 DIAGNOSIS — I10 Essential (primary) hypertension: Secondary | ICD-10-CM | POA: Diagnosis not present

## 2014-04-08 DIAGNOSIS — I69320 Aphasia following cerebral infarction: Secondary | ICD-10-CM | POA: Diagnosis not present

## 2014-04-08 DIAGNOSIS — Z9181 History of falling: Secondary | ICD-10-CM | POA: Diagnosis not present

## 2014-04-10 DIAGNOSIS — I69320 Aphasia following cerebral infarction: Secondary | ICD-10-CM | POA: Diagnosis not present

## 2014-04-10 DIAGNOSIS — M6281 Muscle weakness (generalized): Secondary | ICD-10-CM | POA: Diagnosis not present

## 2014-04-10 DIAGNOSIS — I10 Essential (primary) hypertension: Secondary | ICD-10-CM | POA: Diagnosis not present

## 2014-04-10 DIAGNOSIS — Z9181 History of falling: Secondary | ICD-10-CM | POA: Diagnosis not present

## 2014-04-15 DIAGNOSIS — M6281 Muscle weakness (generalized): Secondary | ICD-10-CM | POA: Diagnosis not present

## 2014-04-15 DIAGNOSIS — I69320 Aphasia following cerebral infarction: Secondary | ICD-10-CM | POA: Diagnosis not present

## 2014-04-15 DIAGNOSIS — Z9181 History of falling: Secondary | ICD-10-CM | POA: Diagnosis not present

## 2014-04-15 DIAGNOSIS — I10 Essential (primary) hypertension: Secondary | ICD-10-CM | POA: Diagnosis not present

## 2014-04-16 DIAGNOSIS — I69320 Aphasia following cerebral infarction: Secondary | ICD-10-CM | POA: Diagnosis not present

## 2014-04-16 DIAGNOSIS — I10 Essential (primary) hypertension: Secondary | ICD-10-CM | POA: Diagnosis not present

## 2014-04-16 DIAGNOSIS — Z9181 History of falling: Secondary | ICD-10-CM | POA: Diagnosis not present

## 2014-04-16 DIAGNOSIS — M6281 Muscle weakness (generalized): Secondary | ICD-10-CM | POA: Diagnosis not present

## 2014-04-18 DIAGNOSIS — I10 Essential (primary) hypertension: Secondary | ICD-10-CM | POA: Diagnosis not present

## 2014-04-18 DIAGNOSIS — M6281 Muscle weakness (generalized): Secondary | ICD-10-CM | POA: Diagnosis not present

## 2014-04-18 DIAGNOSIS — I69320 Aphasia following cerebral infarction: Secondary | ICD-10-CM | POA: Diagnosis not present

## 2014-04-18 DIAGNOSIS — Z9181 History of falling: Secondary | ICD-10-CM | POA: Diagnosis not present

## 2014-04-22 DIAGNOSIS — M6281 Muscle weakness (generalized): Secondary | ICD-10-CM | POA: Diagnosis not present

## 2014-04-22 DIAGNOSIS — I10 Essential (primary) hypertension: Secondary | ICD-10-CM | POA: Diagnosis not present

## 2014-04-22 DIAGNOSIS — Z9181 History of falling: Secondary | ICD-10-CM | POA: Diagnosis not present

## 2014-04-22 DIAGNOSIS — I69320 Aphasia following cerebral infarction: Secondary | ICD-10-CM | POA: Diagnosis not present

## 2014-04-24 DIAGNOSIS — I69320 Aphasia following cerebral infarction: Secondary | ICD-10-CM | POA: Diagnosis not present

## 2014-04-24 DIAGNOSIS — I10 Essential (primary) hypertension: Secondary | ICD-10-CM | POA: Diagnosis not present

## 2014-04-24 DIAGNOSIS — M6281 Muscle weakness (generalized): Secondary | ICD-10-CM | POA: Diagnosis not present

## 2014-04-24 DIAGNOSIS — Z9181 History of falling: Secondary | ICD-10-CM | POA: Diagnosis not present

## 2014-04-30 DIAGNOSIS — I69320 Aphasia following cerebral infarction: Secondary | ICD-10-CM | POA: Diagnosis not present

## 2014-04-30 DIAGNOSIS — Z9181 History of falling: Secondary | ICD-10-CM | POA: Diagnosis not present

## 2014-04-30 DIAGNOSIS — I10 Essential (primary) hypertension: Secondary | ICD-10-CM | POA: Diagnosis not present

## 2014-04-30 DIAGNOSIS — M6281 Muscle weakness (generalized): Secondary | ICD-10-CM | POA: Diagnosis not present

## 2014-05-03 DIAGNOSIS — I10 Essential (primary) hypertension: Secondary | ICD-10-CM | POA: Diagnosis not present

## 2014-05-03 DIAGNOSIS — Z9181 History of falling: Secondary | ICD-10-CM | POA: Diagnosis not present

## 2014-05-03 DIAGNOSIS — M6281 Muscle weakness (generalized): Secondary | ICD-10-CM | POA: Diagnosis not present

## 2014-05-03 DIAGNOSIS — I69320 Aphasia following cerebral infarction: Secondary | ICD-10-CM | POA: Diagnosis not present

## 2014-06-22 ENCOUNTER — Emergency Department (HOSPITAL_COMMUNITY): Payer: Medicare Other

## 2014-06-22 ENCOUNTER — Inpatient Hospital Stay (HOSPITAL_COMMUNITY)
Admission: EM | Admit: 2014-06-22 | Discharge: 2014-06-27 | DRG: 312 | Disposition: A | Discharge status: Home Health | Payer: Medicare Other | Attending: Pulmonary Disease | Admitting: Pulmonary Disease

## 2014-06-22 ENCOUNTER — Encounter (HOSPITAL_COMMUNITY): Payer: Self-pay | Admitting: Emergency Medicine

## 2014-06-22 DIAGNOSIS — H9190 Unspecified hearing loss, unspecified ear: Secondary | ICD-10-CM | POA: Diagnosis present

## 2014-06-22 DIAGNOSIS — I6930 Unspecified sequelae of cerebral infarction: Secondary | ICD-10-CM

## 2014-06-22 DIAGNOSIS — Y92238 Other place in hospital as the place of occurrence of the external cause: Secondary | ICD-10-CM | POA: Diagnosis not present

## 2014-06-22 DIAGNOSIS — M5416 Radiculopathy, lumbar region: Secondary | ICD-10-CM | POA: Diagnosis present

## 2014-06-22 DIAGNOSIS — R531 Weakness: Secondary | ICD-10-CM | POA: Diagnosis not present

## 2014-06-22 DIAGNOSIS — M79662 Pain in left lower leg: Secondary | ICD-10-CM | POA: Diagnosis not present

## 2014-06-22 DIAGNOSIS — E860 Dehydration: Secondary | ICD-10-CM | POA: Diagnosis present

## 2014-06-22 DIAGNOSIS — M4806 Spinal stenosis, lumbar region: Secondary | ICD-10-CM | POA: Diagnosis present

## 2014-06-22 DIAGNOSIS — I10 Essential (primary) hypertension: Secondary | ICD-10-CM | POA: Diagnosis present

## 2014-06-22 DIAGNOSIS — R569 Unspecified convulsions: Secondary | ICD-10-CM | POA: Diagnosis not present

## 2014-06-22 DIAGNOSIS — H409 Unspecified glaucoma: Secondary | ICD-10-CM | POA: Diagnosis present

## 2014-06-22 DIAGNOSIS — H5442 Blindness, left eye, normal vision right eye: Secondary | ICD-10-CM | POA: Diagnosis present

## 2014-06-22 DIAGNOSIS — R32 Unspecified urinary incontinence: Secondary | ICD-10-CM | POA: Diagnosis present

## 2014-06-22 DIAGNOSIS — R195 Other fecal abnormalities: Secondary | ICD-10-CM | POA: Clinically undetermined

## 2014-06-22 DIAGNOSIS — I639 Cerebral infarction, unspecified: Secondary | ICD-10-CM | POA: Diagnosis not present

## 2014-06-22 DIAGNOSIS — K922 Gastrointestinal hemorrhage, unspecified: Secondary | ICD-10-CM | POA: Diagnosis present

## 2014-06-22 DIAGNOSIS — W19XXXA Unspecified fall, initial encounter: Secondary | ICD-10-CM | POA: Diagnosis present

## 2014-06-22 DIAGNOSIS — R040 Epistaxis: Secondary | ICD-10-CM | POA: Diagnosis present

## 2014-06-22 DIAGNOSIS — Z7902 Long term (current) use of antithrombotics/antiplatelets: Secondary | ICD-10-CM

## 2014-06-22 DIAGNOSIS — D62 Acute posthemorrhagic anemia: Secondary | ICD-10-CM | POA: Diagnosis not present

## 2014-06-22 DIAGNOSIS — I69320 Aphasia following cerebral infarction: Secondary | ICD-10-CM

## 2014-06-22 DIAGNOSIS — Z7982 Long term (current) use of aspirin: Secondary | ICD-10-CM | POA: Diagnosis not present

## 2014-06-22 DIAGNOSIS — R4182 Altered mental status, unspecified: Secondary | ICD-10-CM | POA: Diagnosis not present

## 2014-06-22 DIAGNOSIS — S81812A Laceration without foreign body, left lower leg, initial encounter: Secondary | ICD-10-CM | POA: Diagnosis present

## 2014-06-22 DIAGNOSIS — Y92009 Unspecified place in unspecified non-institutional (private) residence as the place of occurrence of the external cause: Secondary | ICD-10-CM | POA: Diagnosis not present

## 2014-06-22 DIAGNOSIS — R55 Syncope and collapse: Secondary | ICD-10-CM | POA: Diagnosis present

## 2014-06-22 DIAGNOSIS — S8992XA Unspecified injury of left lower leg, initial encounter: Secondary | ICD-10-CM | POA: Diagnosis not present

## 2014-06-22 DIAGNOSIS — IMO0002 Reserved for concepts with insufficient information to code with codable children: Secondary | ICD-10-CM

## 2014-06-22 DIAGNOSIS — D508 Other iron deficiency anemias: Secondary | ICD-10-CM | POA: Diagnosis not present

## 2014-06-22 DIAGNOSIS — N179 Acute kidney failure, unspecified: Secondary | ICD-10-CM | POA: Diagnosis present

## 2014-06-22 DIAGNOSIS — M48061 Spinal stenosis, lumbar region without neurogenic claudication: Secondary | ICD-10-CM | POA: Diagnosis present

## 2014-06-22 LAB — I-STAT CHEM 8, ED
BUN: 36 mg/dL — AB (ref 6–23)
Calcium, Ion: 1.15 mmol/L (ref 1.13–1.30)
Chloride: 104 mmol/L (ref 96–112)
Creatinine, Ser: 1.5 mg/dL — ABNORMAL HIGH (ref 0.50–1.10)
Glucose, Bld: 120 mg/dL — ABNORMAL HIGH (ref 70–99)
HCT: 35 % — ABNORMAL LOW (ref 36.0–46.0)
HEMOGLOBIN: 11.9 g/dL — AB (ref 12.0–15.0)
Potassium: 4.2 mmol/L (ref 3.5–5.1)
SODIUM: 142 mmol/L (ref 135–145)
TCO2: 25 mmol/L (ref 0–100)

## 2014-06-22 NOTE — ED Notes (Signed)
Pt fell striking left leg. Leg bleeding at this time and per ems there was a significant amount of blood loss.

## 2014-06-22 NOTE — ED Provider Notes (Signed)
CSN: [id number]     Arrival date & time 06/22/14  2205 History  This chart was scribed for Carla Rhine, MD by Tonye Royalty, ED Scribe. This patient was seen in room APA02/APA02 and the patient's care was started at 11:24 PM.    LEVEL 5 CAVEAT: history limited by dysarthria  Chief Complaint  Patient presents with  . Fall   Patient is a 81 y.o. female presenting with fall. The history is provided by the patient. The history is limited by the condition of the patient. No language interpreter was used.  Fall This is a new problem. The current episode started 1 to 2 hours ago. Episode frequency: once. The problem has been gradually improving (bleeding controlled). Pertinent negatives include no chest pain, no abdominal pain and no headaches. Nothing aggravates the symptoms. Nothing relieves the symptoms.    HPI Comments: Carla Bonilla is a 81 y.o. female who presents to the Emergency Department complaining of fall 1 hour ago, wounding her left leg. She is normally in a wheelchair and rose from it to get to the refrigerator when she fell. She denies syncope or LOC. She denies striking her head. She lives at home but has a caretaker. She denies headache, neck pain, back pain, chest pain, or abdominal pain.  Past Medical History  Diagnosis Date  . Hypertension   . Blind left eye   . Glaucoma   . Hearing loss   . CVA (cerebral vascular accident)     speech problem   Past Surgical History  Procedure Laterality Date  . Shoulder surgery      RIGHT shoulder open treatment internal fixation with locking plate. Date of surgery November 25, 2010  . Orif humerus fracture  11/25/2010    Procedure: OPEN REDUCTION INTERNAL FIXATION (ORIF) PROXIMAL HUMERUS FRACTURE;  Surgeon: Fuller Canada, MD;  Location: [facility];  Service: Orthopedics;  Laterality: Right;  . Eye surgery      left   Family History  Problem Relation Age of Onset  . Heart disease     History  Substance Use Topics  . Smoking  status: Never Smoker   . Smokeless tobacco: Not on file  . Alcohol Use: No   OB History    No data available     Review of Systems  Cardiovascular: Negative for chest pain.  Gastrointestinal: Negative for abdominal pain.  Musculoskeletal: Negative for back pain and neck pain.  Skin: Positive for wound.  Neurological: Negative for syncope and headaches.  All other systems reviewed and are negative.     Allergies  Review of patient's allergies indicates no known allergies.  Home Medications   Prior to Admission medications   Medication Sig Start Date End Date Taking? Authorizing Provider  amLODipine (NORVASC) 5 MG tablet Take 5 mg by mouth daily.   Yes Historical Provider, MD  aspirin EC 81 MG tablet Take 81 mg by mouth daily.     Yes Historical Provider, MD  metoprolol tartrate (LOPRESSOR) 25 MG tablet Take 25 mg by mouth 2 (two) times daily.     Yes Historical Provider, MD  moxifloxacin (VIGAMOX) 0.5 % ophthalmic solution Place 1 drop into the left eye 2 (two) times daily.    Yes Historical Provider, MD  alendronate (FOSAMAX) 70 MG tablet Take 70 mg by mouth once a week. 01/29/13   Historical Provider, MD  clopidogrel (PLAVIX) 75 MG tablet Take 75 mg by mouth daily.      Historical Provider, MD  furosemide (  LASIX) 40 MG tablet Take 40 mg by mouth daily. 01/29/13   Historical Provider, MD  gabapentin (NEURONTIN) 100 MG capsule Take 1 capsule (100 mg total) by mouth 3 (three) times daily. 07/17/13   Vickki Hearing, MD  losartan (COZAAR) 100 MG tablet Take 100 mg by mouth daily. 01/29/13   Historical Provider, MD  PROAIR HFA 108 (90 BASE) MCG/ACT inhaler Inhale 2 puffs into the lungs every 6 (six) hours as needed for wheezing or shortness of breath.  01/04/13   Historical Provider, MD  timolol (TIMOPTIC) 0.25 % ophthalmic solution Place 1 drop into the right eye 2 (two) times daily.     Historical Provider, MD   BP 137/57 mmHg  Pulse 78  Temp(Src) 98.2 F (36.8 C)  Resp 18   Ht  (1.651 m)  Wt 140 lb (63.504 kg)  BMI 23.30 kg/m2  SpO2 99% Physical Exam  Nursing note and vitals reviewed.  CONSTITUTIONAL: Well developed/well nourished HEAD: Normocephalic/atraumatic ENMT: Mucous membranes moist NECK: supple no meningeal signs SPINE/BACK:entire spine nontender CV: S1/S2 noted, no murmurs/rubs/gallops noted LUNGS: Lungs are clear to auscultation bilaterally, no apparent distress ABDOMEN: soft, nontender, no rebound or guarding, bowel sounds noted throughout abdomen GU:no cva tenderness NEURO: Pt is awake/alert/appropriate, moves all extremitiesx4.  No facial droop.  Patient with dysarthria baseline. EXTREMITIES: pulses normal/equal, full ROM, large laceration to lateral aspect of left lower leg SKIN: warm, color normal PSYCH: no abnormalities of mood noted, alert and oriented to situation  ED Course  Procedures  LACERATION REPAIR Performed by: Joya Gaskins Consent: Verbal consent obtained. Risks and benefits: risks, benefits and alternatives were discussed Patient identity confirmed: provided demographic data Time out performed prior to procedure Prepped and Draped in normal sterile fashion Wound explored Laceration Location: left lower extremity Laceration Length: 5 cm No Foreign Bodies seen or palpated Anesthesia: local infiltration Local anesthetic: lidocaine 1% with epinephrine Anesthetic total: 10 ml Irrigation method: syringe Amount of cleaning: standard Skin closure: complex Number of sutures or staples: 4 sutures, 4 staples Technique: simple interrupted Patient tolerance: Patient tolerated the procedure well with no immediate complications.  DIAGNOSTIC STUDIES: Oxygen Saturation is 99% on room air, normal by my interpretation.    COORDINATION OF CARE: 11:29 PM Discussed treatment plan with patient at beside, including x-ray and laceration repair. The patient agrees with the plan and has no further questions at this time. At  time of discharge pt was awake/alert, no distress, at baseline, appropriate  2:53 AM After discharge while she was getting back in car, apparently she had episode of unreponsiveness and incontinence.  She is now awake Imaging/ekg ordered She is awake/alert, maex4.  No facial droop.  She has dysarthria at baseline There was concern for possible bloody stool, but no melena or blood noted on my exam with nurse present 4:27 AM Pt back to baseline CT head negative for acute disease Will admit for observation D/w dr Conley Rolls, will admit for dr Juanetta Gosling for monitoring of syncopal episode Will also need appropriate wound care for her left LE laceration Labs Review Labs Reviewed  I-STAT CHEM 8, ED - Abnormal; Notable for the following:    BUN 36 (*)    Creatinine, Ser 1.50 (*)    Glucose, Bld 120 (*)    Hemoglobin 11.9 (*)    HCT 35.0 (*)    All other components within normal limits    Imaging Review Ct Head Wo Contrast  06/23/2014   CLINICAL DATA:  Acute onset of  generalized weakness. Recent fall. Unresponsive. Initial encounter.  EXAM: CT HEAD WITHOUT CONTRAST  TECHNIQUE: Contiguous axial images were obtained from the base of the skull through the vertex without intravenous contrast.  COMPARISON:  CT of the head performed 10/07/2013, and MRI of the brain performed 10/05/2013  FINDINGS: There is no evidence of acute infarction, mass lesion, or intra- or extra-axial hemorrhage on CT.  Prominence of the ventricles and sulci reflects moderate cortical volume loss. A chronic infarct is noted at the left parietal lobe, with associated encephalomalacia. A smaller chronic infarct is also noted at the right posterior parietal lobe, with associated encephalomalacia. Chronic infarct is noted at the right cerebellar hemisphere. Cerebellar atrophy is noted. Scattered periventricular and subcortical white matter change likely reflects small vessel ischemic microangiopathy.  The brainstem and fourth ventricle are within  normal limits. No mass effect or midline shift is seen.  There is no evidence of fracture; visualized osseous structures are unremarkable in appearance. The orbits are within normal limits. There is partial opacification of the right mastoid air cells. The paranasal sinuses and left mastoid air cells are well-aerated. No significant soft tissue abnormalities are seen.  IMPRESSION: 1. No acute intracranial pathology seen on CT. 2. Moderate cortical volume loss and scattered small vessel ischemic microangiopathy. 3. Chronic infarct at the left parietal lobe, with associated encephalomalacia. Smaller chronic infarct at the right posterior parietal lobe, with associated encephalomalacia. Chronic infarct at the right cerebellar hemisphere. 4. Partial opacification of the right mastoid air cells.   Electronically Signed   By: Roanna RaiderJeffery  Chang M.D.   On: 06/23/2014 03:40   Dg Tibia/fibula Left Port  06/23/2014   ADDENDUM REPORT: 06/23/2014 01:10  ADDENDUM: Two views of the left lower leg are negative for fracture, dislocation or radiopaque foreign body. No acute soft tissue abnormalities are evident. Moderate arthritic changes are noted about the knee.  IMPRESSION: Negative for acute fracture or acute soft tissue abnormality.   Electronically Signed   By: Ellery Plunkaniel R Mitchell M.D.   On: 06/23/2014 01:10   06/23/2014   CLINICAL DATA:  Pain after falling 1 hr ago while getting out of her wheelchair.  EXAM: PORTABLE LEFT TIBIA AND FIBULA - 2 VIEW  COMPARISON:  None.  Electronically Signed: By: Ellery Plunkaniel R Mitchell M.D. On: 06/23/2014 00:20    EKG Interpretation  Date/Time:  Sunday June 23 2014 03:32:31 EDT Ventricular Rate:  64 PR Interval:  155 QRS Duration: 91 QT Interval:  416 QTC Calculation: 429 R Axis:   67 Text Interpretation:  Sinus rhythm artifact noted Non-specific ST-t changes No significant change since last tracing Confirmed by Bebe ShaggyWICKLINE  MD, Dorinda HillNALD ([id number]) on 06/23/2014 3:37:04 AM        MDM   Final  diagnoses:  Laceration  Laceration of left lower extremity, initial encounter  Syncope, unspecified syncope type    Nursing notes including past medical history and social history reviewed and considered in documentation xrays/imaging reviewed by myself and considered during evaluation Labs/vital reviewed myself and considered during evaluation    I personally performed the services described in this documentation, which was scribed in my presence. The recorded information has been reviewed and is accurate.      Carla Rhineonald Anja Neuzil, MD 06/23/14 [phone]

## 2014-06-23 ENCOUNTER — Emergency Department (HOSPITAL_COMMUNITY): Payer: Medicare Other

## 2014-06-23 DIAGNOSIS — T148 Other injury of unspecified body region: Secondary | ICD-10-CM | POA: Diagnosis not present

## 2014-06-23 DIAGNOSIS — I6930 Unspecified sequelae of cerebral infarction: Secondary | ICD-10-CM | POA: Diagnosis not present

## 2014-06-23 DIAGNOSIS — R531 Weakness: Secondary | ICD-10-CM | POA: Diagnosis not present

## 2014-06-23 DIAGNOSIS — R55 Syncope and collapse: Principal | ICD-10-CM

## 2014-06-23 DIAGNOSIS — I639 Cerebral infarction, unspecified: Secondary | ICD-10-CM | POA: Diagnosis not present

## 2014-06-23 DIAGNOSIS — M79662 Pain in left lower leg: Secondary | ICD-10-CM | POA: Diagnosis not present

## 2014-06-23 DIAGNOSIS — IMO0002 Reserved for concepts with insufficient information to code with codable children: Secondary | ICD-10-CM

## 2014-06-23 DIAGNOSIS — S8992XA Unspecified injury of left lower leg, initial encounter: Secondary | ICD-10-CM | POA: Diagnosis not present

## 2014-06-23 LAB — TROPONIN I
Troponin I: <0.03 ng/mL (ref <0.031)
Troponin I: <0.03 ng/mL (ref <0.031)

## 2014-06-23 LAB — CBC
HCT: 30.4 % — ABNORMAL LOW (ref 36.0–46.0)
Hemoglobin: 9.9 g/dL — ABNORMAL LOW (ref 12.0–15.0)
MCH: 31.5 pg (ref 26.0–34.0)
MCHC: 32.6 g/dL (ref 30.0–36.0)
MCV: 96.8 fL (ref 78.0–100.0)
PLATELETS: 304 10*3/uL (ref 150–400)
RBC: 3.14 MIL/uL — AB (ref 3.87–5.11)
RDW: 14.5 % (ref 11.5–15.5)
WBC: 12.8 10*3/uL — ABNORMAL HIGH (ref 4.0–10.5)

## 2014-06-23 LAB — CREATININE, SERUM
Creatinine, Ser: 1.4 mg/dL — ABNORMAL HIGH (ref 0.50–1.10)
GFR calc Af Amer: 37 mL/min — ABNORMAL LOW (ref >90)
GFR, EST NON AFRICAN AMERICAN: 32 mL/min — AB (ref >90)

## 2014-06-23 LAB — POC OCCULT BLOOD, ED: FECAL OCCULT BLD: POSITIVE — AB

## 2014-06-23 LAB — CBG MONITORING, ED: Glucose-Capillary: 177 mg/dL — ABNORMAL HIGH (ref 70–99)

## 2014-06-23 MED ORDER — SODIUM CHLORIDE 0.9 % IJ SOLN
3.0000 mL | Freq: Two times a day (BID) | INTRAMUSCULAR | Status: DC
  Administered 2014-06-23 – 2014-06-26 (×5): 3 mL via INTRAVENOUS

## 2014-06-23 MED ORDER — GATIFLOXACIN 0.5 % OP SOLN
1.0000 [drp] | Freq: Two times a day (BID) | OPHTHALMIC | Status: DC
  Administered 2014-06-23 – 2014-06-26 (×8): 1 [drp] via OPHTHALMIC
  Filled 2014-06-23: qty 2.5

## 2014-06-23 MED ORDER — HEPARIN SODIUM (PORCINE) 5000 UNIT/ML IJ SOLN
5000.0000 [IU] | Freq: Three times a day (TID) | INTRAMUSCULAR | Status: DC
  Administered 2014-06-23 – 2014-06-25 (×7): 5000 [IU] via SUBCUTANEOUS
  Filled 2014-06-23 (×7): qty 1

## 2014-06-23 MED ORDER — LIDOCAINE-EPINEPHRINE (PF) 2 %-1:200000 IJ SOLN
INTRAMUSCULAR | Status: AC
  Administered 2014-06-23: 04:00:00
  Filled 2014-06-23: qty 20

## 2014-06-23 MED ORDER — TIMOLOL MALEATE 0.25 % OP SOLN
1.0000 [drp] | Freq: Two times a day (BID) | OPHTHALMIC | Status: DC
  Administered 2014-06-23 – 2014-06-27 (×9): 1 [drp] via OPHTHALMIC
  Filled 2014-06-23 (×2): qty 5

## 2014-06-23 MED ORDER — AMLODIPINE BESYLATE 5 MG PO TABS
5.0000 mg | ORAL_TABLET | Freq: Every day | ORAL | Status: DC
  Administered 2014-06-23 – 2014-06-27 (×5): 5 mg via ORAL
  Filled 2014-06-23 (×5): qty 1

## 2014-06-23 MED ORDER — ASPIRIN EC 81 MG PO TBEC
81.0000 mg | DELAYED_RELEASE_TABLET | Freq: Every day | ORAL | Status: DC
  Administered 2014-06-23 – 2014-06-24 (×2): 81 mg via ORAL
  Filled 2014-06-23 (×2): qty 1

## 2014-06-23 MED ORDER — METOPROLOL TARTRATE 25 MG PO TABS
25.0000 mg | ORAL_TABLET | Freq: Two times a day (BID) | ORAL | Status: DC
  Administered 2014-06-23 – 2014-06-27 (×8): 25 mg via ORAL
  Filled 2014-06-23 (×8): qty 1

## 2014-06-23 MED ORDER — LOSARTAN POTASSIUM 50 MG PO TABS
100.0000 mg | ORAL_TABLET | Freq: Every day | ORAL | Status: DC
  Administered 2014-06-23 – 2014-06-27 (×5): 100 mg via ORAL
  Filled 2014-06-23 (×5): qty 2

## 2014-06-23 MED ORDER — CLOPIDOGREL BISULFATE 75 MG PO TABS
75.0000 mg | ORAL_TABLET | Freq: Every day | ORAL | Status: DC
  Administered 2014-06-23 – 2014-06-24 (×2): 75 mg via ORAL
  Filled 2014-06-23 (×2): qty 1

## 2014-06-23 MED ORDER — ALENDRONATE SODIUM 70 MG PO TABS: 70.0000 mg | ORAL_TABLET | ORAL | Status: DC

## 2014-06-23 MED ORDER — ALBUTEROL SULFATE HFA 108 (90 BASE) MCG/ACT IN AERS
2.0000 | INHALATION_SPRAY | Freq: Four times a day (QID) | RESPIRATORY_TRACT | Status: DC | PRN
  Filled 2014-06-23: qty 6.7

## 2014-06-23 MED ORDER — ALBUTEROL SULFATE (2.5 MG/3ML) 0.083% IN NEBU: 2.5000 mg | INHALATION_SOLUTION | Freq: Four times a day (QID) | RESPIRATORY_TRACT | Status: DC | PRN

## 2014-06-23 MED ORDER — GABAPENTIN 100 MG PO CAPS
100.0000 mg | ORAL_CAPSULE | Freq: Three times a day (TID) | ORAL | Status: DC
  Administered 2014-06-23 – 2014-06-27 (×13): 100 mg via ORAL
  Filled 2014-06-23 (×13): qty 1

## 2014-06-23 NOTE — Progress Notes (Signed)

## 2014-06-23 NOTE — Progress Notes (Signed)
Patient requesting eye drops.  Notified MD and received order to continue eye drops.

## 2014-06-23 NOTE — H&P (Signed)
[facility] History and Physical  [name]:[id number] DOB: [birth year]    PCP:   Fredirick Maudlin, MD   Chief Complaint: Syncope in the parking lot after discharge from the ED.  HPI: Carla Bonilla is an 81 y.o. female with hx of HTN, glaucoma, hearing loss, prior CVA with expressive aphasia, brought to the ER after she had a mechanical fall, striking her had and had a lower leg laceration.  She had a negative head CT, and work up was unremarkable at that time.  She had the laceration sutured, which will need close wound follow up, and was discharged from the ER.  As she was leaving, however, her caretaker brought her back as she had a sudden loss of consciousness, had bowel and bladder incontinence, but no seizure activity reported.  She was conversing, and had no clinical presentation suspicous for postictal.  She did not recall the event.  She is back to her baseline when I saw her.  No repeat blood work was obtained.  Hospitalist was asked to admit her for syncope.  Rewiew of Systems:  Constitutional: Negative for malaise, fever and chills. No significant weight loss or weight gain Eyes: Negative for eye pain, redness and discharge, diplopia, visual changes, or flashes of light. ENMT: Negative for ear pain, hoarseness, nasal congestion, sinus pressure and sore throat. No headaches; tinnitus, drooling, or problem swallowing. Cardiovascular: Negative for chest pain, palpitations, diaphoresis, dyspnea and peripheral edema. ; No orthopnea, PND Respiratory: Negative for cough, hemoptysis, wheezing and stridor. No pleuritic chestpain. Gastrointestinal: Negative for nausea, vomiting, diarrhea, constipation, abdominal pain, melena, blood in stool, hematemesis, jaundice and rectal bleeding.    Genitourinary: Negative for frequency, dysuria, incontinence,flank pain and hematuria; Musculoskeletal: Negative for back pain and neck pain. Negative for swelling and trauma.;  Skin: .  Negative for pruritus, rash, abrasions, bruising and skin lesion.; ulcerations Neuro: Negative for headache, lightheadedness and neck stiffness. Negative for weakness, altered level of consciousness , altered mental status, extremity weakness, burning feet, involuntary movement, seizure and syncope.  Psych: negative for anxiety, depression, insomnia, tearfulness, panic attacks, hallucinations, paranoia, suicidal or homicidal ideation    Past Medical History  Diagnosis Date  . Hypertension   . Blind left eye   . Glaucoma   . Hearing loss   . CVA (cerebral vascular accident)     speech problem    Past Surgical History  Procedure Laterality Date  . Shoulder surgery      RIGHT shoulder open treatment internal fixation with locking plate. Date of surgery November 25, 2010  . Orif humerus fracture  11/25/2010    Procedure: OPEN REDUCTION INTERNAL FIXATION (ORIF) PROXIMAL HUMERUS FRACTURE;  Surgeon: Fuller Canada, MD;  Location: [facility];  Service: Orthopedics;  Laterality: Right;  . Eye surgery      left    Medications:  HOME MEDS: Prior to Admission medications   Medication Sig Start Date End Date Taking? Authorizing Provider  amLODipine (NORVASC) 5 MG tablet Take 5 mg by mouth daily.   Yes Historical Provider, MD  aspirin EC 81 MG tablet Take 81 mg by mouth daily.     Yes Historical Provider, MD  metoprolol tartrate (LOPRESSOR) 25 MG tablet Take 25 mg by mouth 2 (two) times daily.     Yes Historical Provider, MD  moxifloxacin (VIGAMOX) 0.5 % ophthalmic solution Place 1 drop into the left eye 2 (two) times daily.    Yes Historical Provider, MD  alendronate (FOSAMAX) 70 MG tablet Take  70 mg by mouth once a week. 01/29/13   Historical Provider, MD  clopidogrel (PLAVIX) 75 MG tablet Take 75 mg by mouth daily.      Historical Provider, MD  furosemide (LASIX) 40 MG tablet Take 40 mg by mouth daily. 01/29/13   Historical Provider, MD  gabapentin (NEURONTIN) 100 MG capsule Take 1 capsule (100  mg total) by mouth 3 (three) times daily. 07/17/13   Vickki Hearing, MD  losartan (COZAAR) 100 MG tablet Take 100 mg by mouth daily. 01/29/13   Historical Provider, MD  PROAIR HFA 108 (90 BASE) MCG/ACT inhaler Inhale 2 puffs into the lungs every 6 (six) hours as needed for wheezing or shortness of breath.  01/04/13   Historical Provider, MD  timolol (TIMOPTIC) 0.25 % ophthalmic solution Place 1 drop into the right eye 2 (two) times daily.     Historical Provider, MD     Allergies:  No Known Allergies  Social History:   reports that she has never smoked. She does not have any smokeless tobacco history on file. She reports that she does not drink alcohol or use illicit drugs.  Family History: Family History  Problem Relation Age of Onset  . Heart disease       Physical Exam: Filed Vitals:   06/22/14 2330 06/23/14 0000 06/23/14 0026 06/23/14 0239  BP: 141/56 125/87 138/56 127/50  Pulse: 63 72 73 76  Temp:   98.2 F (36.8 C) 98.4 F (36.9 C)  TempSrc:   Oral Oral  Resp:   18 18  Height:      Weight:      SpO2: 96% 98% 97% 96%   Blood pressure 127/50, pulse 76, temperature 98.4 F (36.9 C), temperature source Oral, resp. rate 18, height  (1.651 m), weight 63.504 kg (140 lb), SpO2 96 %.  GEN:  Pleasant  patient lying in the stretcher in no acute distress; cooperative with exam. PSYCH:  alert and oriented x4; does not appear anxious or depressed; affect is appropriate. HEENT: Mucous membranes pink and anicteric; PERRLA; EOM intact; no cervical lymphadenopathy nor thyromegaly or carotid bruit; no JVD; There were no stridor. Neck is very supple. Breasts:: Not examined CHEST WALL: No tenderness CHEST: Normal respiration, clear to auscultation bilaterally.  HEART: Regular rate and rhythm.  There are no murmur, rub, or gallops.   BACK: No kyphosis or scoliosis; no CVA tenderness ABDOMEN: soft and non-tender; no masses, no organomegaly, normal abdominal bowel sounds; no  pannus; no intertriginous candida. There is no rebound and no distention. Rectal Exam: Not done EXTREMITIES: No bone or joint deformity; age-appropriate arthropathy of the hands and knees; no edema; no ulcerations.  There is no calf tenderness. Genitalia: not examined PULSES: 2+ and symmetric SKIN: laceration, dressing not removed.  CNS: Cranial nerves 2-12 grossly intact no focal lateralizing neurologic deficit.  Speech is aphasic, uvula elevated with phonation, facial symmetry and tongue midline. DTR are normal bilaterally, cerebella exam is intact, barbinski is negative and strengths are equaled bilaterally.  No sensory loss.   Labs on Admission:  Basic Metabolic Panel:  Recent Labs Lab 06/22/14 2351  NA 142  K 4.2  CL 104  GLUCOSE 120*  BUN 36*  CREATININE 1.50*   Liver Function Tests: No results for input(s): AST, ALT, ALKPHOS, BILITOT, PROT, ALBUMIN in the last 168 hours. No results for input(s): LIPASE, AMYLASE in the last 168 hours. No results for input(s): AMMONIA in the last 168 hours. CBC:  Recent Labs Lab  06/22/14 2351  HGB 11.9*  HCT 35.0*   Cardiac Enzymes: No results for input(s): CKTOTAL, CKMB, CKMBINDEX, TROPONINI in the last 168 hours.  CBG:  Recent Labs Lab 06/23/14 0255  GLUCAP 177*     Radiological Exams on Admission: Ct Head Wo Contrast  06/23/2014   CLINICAL DATA:  Acute onset of generalized weakness. Recent fall. Unresponsive. Initial encounter.  EXAM: CT HEAD WITHOUT CONTRAST  TECHNIQUE: Contiguous axial images were obtained from the base of the skull through the vertex without intravenous contrast.  COMPARISON:  CT of the head performed 10/07/2013, and MRI of the brain performed 10/05/2013  FINDINGS: There is no evidence of acute infarction, mass lesion, or intra- or extra-axial hemorrhage on CT.  Prominence of the ventricles and sulci reflects moderate cortical volume loss. A chronic infarct is noted at the left parietal lobe, with associated  encephalomalacia. A smaller chronic infarct is also noted at the right posterior parietal lobe, with associated encephalomalacia. Chronic infarct is noted at the right cerebellar hemisphere. Cerebellar atrophy is noted. Scattered periventricular and subcortical white matter change likely reflects small vessel ischemic microangiopathy.  The brainstem and fourth ventricle are within normal limits. No mass effect or midline shift is seen.  There is no evidence of fracture; visualized osseous structures are unremarkable in appearance. The orbits are within normal limits. There is partial opacification of the right mastoid air cells. The paranasal sinuses and left mastoid air cells are well-aerated. No significant soft tissue abnormalities are seen.  IMPRESSION: 1. No acute intracranial pathology seen on CT. 2. Moderate cortical volume loss and scattered small vessel ischemic microangiopathy. 3. Chronic infarct at the left parietal lobe, with associated encephalomalacia. Smaller chronic infarct at the right posterior parietal lobe, with associated encephalomalacia. Chronic infarct at the right cerebellar hemisphere. 4. Partial opacification of the right mastoid air cells.   Electronically Signed   By: Roanna RaiderJeffery  Chang M.D.   On: 06/23/2014 03:40   Dg Tibia/fibula Left Port  06/23/2014   ADDENDUM REPORT: 06/23/2014 01:10  ADDENDUM: Two views of the left lower leg are negative for fracture, dislocation or radiopaque foreign body. No acute soft tissue abnormalities are evident. Moderate arthritic changes are noted about the knee.  IMPRESSION: Negative for acute fracture or acute soft tissue abnormality.   Electronically Signed   By: Ellery Plunkaniel R Mitchell M.D.   On: 06/23/2014 01:10   06/23/2014   CLINICAL DATA:  Pain after falling 1 hr ago while getting out of her wheelchair.  EXAM: PORTABLE LEFT TIBIA AND FIBULA - 2 VIEW  COMPARISON:  None.  Electronically Signed: By: Ellery Plunkaniel R Mitchell M.D. On: 06/23/2014 00:20    EKG:  Independently reviewed. NSR no acute ST T changes.   (this EKG was done after the sycopal event.)   Assessment/Plan Present on Admission:  . Syncope and collapse . Spinal stenosis of lumbar region with radiculopathy   PLAN:  I suspect she has a vasovagal syncope.  Will admit her OBS under Dr Juanetta GoslingHawkins service as per prior arrangement.  Its already 5 am at this time, so she likely would stay the day today, and if no further events, can be discharged tomorrow.  In the interim, I will cycle her troponins (low clinical suspicion), and repeat an H and H in the am.  She reportedly had UTD with her tetanus shot.  I will continue her BP meds, as her SBP at this time is 140.  I will hold her Lasix today.  Thank you for allowing  me to participate in the care of your patient, Dr Juanetta Gosling.   Other plans as per orders.  Code Status:  FULL Unk Lightning, MD. [facility] Pager [phone] 7pm to 7am.  06/23/2014, 4:50 AM

## 2014-06-23 NOTE — Progress Notes (Signed)
UR completed 

## 2014-06-23 NOTE — Progress Notes (Signed)
This is an assumption of care note. This is a 81 year old who had been to the emergency department with a mechanical fall. She was evaluated in the emergency room and discharged but while she was in the parking lot of the emergency room she had syncope. She was brought back to the emergency department and admitted from there after syncope. She has a history of strokes which has left her with severe dysarthria which makes her history difficult to obtain. She denies chest pain. She has not had any palpitations. No other symptoms.  She is awake and alert. She is severely dysarthric. Her chest is clear. Her heart is regular.  She had syncope. She will be worked up for that with cardiac monitoring serial troponins orthostatic vital signs.

## 2014-06-23 NOTE — ED Notes (Signed)
Patient and caregiver verbalize understanding of discharge instructions, home care and follow up care. Patient out of department at this time. 

## 2014-06-23 NOTE — ED Notes (Signed)
Patient has a history of CVA in the past. Patient has aphasia history

## 2014-06-23 NOTE — ED Notes (Signed)
Patient wound cleaned at this time.

## 2014-06-23 NOTE — ED Notes (Signed)
Pt was discharged and had unresponsive episode in car. Pt also had episode of incontinence.

## 2014-06-24 DIAGNOSIS — H5442 Blindness, left eye, normal vision right eye: Secondary | ICD-10-CM | POA: Diagnosis present

## 2014-06-24 DIAGNOSIS — I10 Essential (primary) hypertension: Secondary | ICD-10-CM | POA: Diagnosis present

## 2014-06-24 DIAGNOSIS — M4806 Spinal stenosis, lumbar region: Secondary | ICD-10-CM | POA: Diagnosis present

## 2014-06-24 DIAGNOSIS — K922 Gastrointestinal hemorrhage, unspecified: Secondary | ICD-10-CM | POA: Diagnosis not present

## 2014-06-24 DIAGNOSIS — R569 Unspecified convulsions: Secondary | ICD-10-CM | POA: Diagnosis not present

## 2014-06-24 DIAGNOSIS — W19XXXA Unspecified fall, initial encounter: Secondary | ICD-10-CM | POA: Diagnosis present

## 2014-06-24 DIAGNOSIS — Z7902 Long term (current) use of antithrombotics/antiplatelets: Secondary | ICD-10-CM | POA: Diagnosis not present

## 2014-06-24 DIAGNOSIS — R040 Epistaxis: Secondary | ICD-10-CM | POA: Diagnosis present

## 2014-06-24 DIAGNOSIS — H9190 Unspecified hearing loss, unspecified ear: Secondary | ICD-10-CM | POA: Diagnosis present

## 2014-06-24 DIAGNOSIS — Y92238 Other place in hospital as the place of occurrence of the external cause: Secondary | ICD-10-CM | POA: Diagnosis not present

## 2014-06-24 DIAGNOSIS — Y92009 Unspecified place in unspecified non-institutional (private) residence as the place of occurrence of the external cause: Secondary | ICD-10-CM | POA: Diagnosis not present

## 2014-06-24 DIAGNOSIS — D62 Acute posthemorrhagic anemia: Secondary | ICD-10-CM | POA: Diagnosis not present

## 2014-06-24 DIAGNOSIS — R32 Unspecified urinary incontinence: Secondary | ICD-10-CM | POA: Diagnosis present

## 2014-06-24 DIAGNOSIS — R4182 Altered mental status, unspecified: Secondary | ICD-10-CM | POA: Diagnosis not present

## 2014-06-24 DIAGNOSIS — D508 Other iron deficiency anemias: Secondary | ICD-10-CM | POA: Diagnosis not present

## 2014-06-24 DIAGNOSIS — Z7982 Long term (current) use of aspirin: Secondary | ICD-10-CM | POA: Diagnosis not present

## 2014-06-24 DIAGNOSIS — E860 Dehydration: Secondary | ICD-10-CM | POA: Diagnosis present

## 2014-06-24 DIAGNOSIS — S81812A Laceration without foreign body, left lower leg, initial encounter: Secondary | ICD-10-CM | POA: Diagnosis present

## 2014-06-24 DIAGNOSIS — R55 Syncope and collapse: Secondary | ICD-10-CM | POA: Diagnosis not present

## 2014-06-24 DIAGNOSIS — N179 Acute kidney failure, unspecified: Secondary | ICD-10-CM | POA: Diagnosis present

## 2014-06-24 DIAGNOSIS — R195 Other fecal abnormalities: Secondary | ICD-10-CM | POA: Diagnosis not present

## 2014-06-24 DIAGNOSIS — I6930 Unspecified sequelae of cerebral infarction: Secondary | ICD-10-CM | POA: Diagnosis not present

## 2014-06-24 DIAGNOSIS — H409 Unspecified glaucoma: Secondary | ICD-10-CM | POA: Diagnosis present

## 2014-06-24 DIAGNOSIS — I69320 Aphasia following cerebral infarction: Secondary | ICD-10-CM | POA: Diagnosis not present

## 2014-06-24 DIAGNOSIS — M5416 Radiculopathy, lumbar region: Secondary | ICD-10-CM | POA: Diagnosis present

## 2014-06-24 LAB — TSH: TSH: 3.104 u[IU]/mL (ref 0.350–4.500)

## 2014-06-24 LAB — RETICULOCYTES
RBC.: 2.9 MIL/uL — ABNORMAL LOW (ref 3.87–5.11)
RETIC COUNT ABSOLUTE: 49.3 10*3/uL (ref 19.0–186.0)
RETIC CT PCT: 1.7 % (ref 0.4–3.1)

## 2014-06-24 LAB — CBC
HCT: 24 % — ABNORMAL LOW (ref 36.0–46.0)
Hemoglobin: 8 g/dL — ABNORMAL LOW (ref 12.0–15.0)
MCH: 32.3 pg (ref 26.0–34.0)
MCHC: 33.3 g/dL (ref 30.0–36.0)
MCV: 96.8 fL (ref 78.0–100.0)
PLATELETS: 229 10*3/uL (ref 150–400)
RBC: 2.48 MIL/uL — AB (ref 3.87–5.11)
RDW: 15 % (ref 11.5–15.5)
WBC: 10.3 10*3/uL (ref 4.0–10.5)

## 2014-06-24 LAB — BASIC METABOLIC PANEL
ANION GAP: 7 (ref 5–15)
BUN: 44 mg/dL — AB (ref 6–23)
CO2: 27 mmol/L (ref 19–32)
Calcium: 8.3 mg/dL — ABNORMAL LOW (ref 8.4–10.5)
Chloride: 108 mmol/L (ref 96–112)
Creatinine, Ser: 1.72 mg/dL — ABNORMAL HIGH (ref 0.50–1.10)
GFR calc Af Amer: 29 mL/min — ABNORMAL LOW (ref >90)
GFR calc non Af Amer: 25 mL/min — ABNORMAL LOW (ref >90)
Glucose, Bld: 98 mg/dL (ref 70–99)
POTASSIUM: 3.8 mmol/L (ref 3.5–5.1)
Sodium: 142 mmol/L (ref 135–145)

## 2014-06-24 LAB — VITAMIN B12: Vitamin B-12: 519 pg/mL (ref 211–911)

## 2014-06-24 LAB — IRON AND TIBC
IRON: 65 ug/dL (ref 42–145)
SATURATION RATIOS: 25 % (ref 20–55)
TIBC: 257 ug/dL (ref 250–470)
UIBC: 192 ug/dL (ref 125–400)

## 2014-06-24 LAB — FOLATE: Folate: >20 ng/mL

## 2014-06-24 LAB — FERRITIN: Ferritin: 154 ng/mL (ref 10–291)

## 2014-06-24 MED ORDER — SODIUM CHLORIDE 0.9 % IV SOLN
INTRAVENOUS | Status: DC
  Administered 2014-06-24 – 2014-06-25 (×2): via INTRAVENOUS
  Administered 2014-06-26: 50 mL/h via INTRAVENOUS
  Administered 2014-06-27: 07:00:00 via INTRAVENOUS

## 2014-06-24 MED ORDER — MIDODRINE HCL 5 MG PO TABS
2.5000 mg | ORAL_TABLET | Freq: Two times a day (BID) | ORAL | Status: DC
  Administered 2014-06-24 – 2014-06-27 (×6): 2.5 mg via ORAL
  Filled 2014-06-24 (×6): qty 1

## 2014-06-24 NOTE — Plan of Care (Signed)
Problem: Phase I Progression Outcomes Goal: OOB as tolerated unless otherwise ordered Outcome: Completed/Met Date Met:  06/24/14 Patient up to chair today.

## 2014-06-24 NOTE — Evaluation (Signed)
Physical Therapy Evaluation Patient Details Name: Carla MajorJean M Wasser MRN: [id number] DOB: [birth year] Today's Date: 06/24/2014   History of Present Illness  Carla Bonilla is an 81 y.o. female with hx of HTN, glaucoma, hearing loss, prior CVA with expressive aphasia, brought to the ER after she had a mechanical fall, striking her had and had a lower leg laceration. She had a negative head CT, and work up was unremarkable at that time. She had the laceration sutured, which will need close wound follow up, and was discharged from the ER. As she was leaving, however, her caretaker brought her back as she had a sudden loss of consciousness, had bowel and bladder incontinence, but no seizure activity reported. She was conversing, and had no clinical presentation suspicous for postictal. She did not recall the event. She is back to her baseline when I saw her. No repeat blood work was obtained. Hospitalist was asked to admit her for syncope.  Clinical Impression   Pt is seen for evaluation and found to be at functional baseline.  Her speech is extremely difficult to understand but in piecing together her history she is noted to have full time CG and she is not functionally ambulatory.  This is probably due to poor balance from old CVA.  She has mod to severe expressive aphasia.    Currently, she is independent in transfers and bed mobility.  She is alert and feeling well, very pleasant.  No further PT should be needed.    Follow Up Recommendations No PT follow up    Equipment Recommendations  None recommended by PT    Recommendations for Other Services   none    Precautions / Restrictions Precautions Precautions: Fall Restrictions Weight Bearing Restrictions: No      Mobility  Bed Mobility Overal bed mobility: Modified Independent                Transfers Overall transfer level: Modified independent Equipment used: None             General transfer comment: transfers  independently from bed to chair  Ambulation/Gait Ambulation/Gait assistance:  (not tested)              Stairs            Wheelchair Mobility    Modified Rankin (Stroke Patients Only)       Balance Overall balance assessment: Needs assistance;History of Falls Sitting-balance support: No upper extremity supported;Feet supported Sitting balance-Leahy Scale: Normal     Standing balance support: Bilateral upper extremity supported Standing balance-Leahy Scale: Fair                               Pertinent Vitals/Pain Pain Assessment: No/denies pain    Home Living Family/patient expects to be discharged to:: Private residence Living Arrangements: Non-relatives/Friends Available Help at Discharge: Personal care attendant;Available 24 hours/day Type of Home: House Home Access: Level entry     Home Layout: One level Home Equipment: Walker - 2 wheels;Bedside commode;Grab bars - tub/shower      Prior Function Level of Independence: Needs assistance   Gait / Transfers Assistance Needed: pt transfers independently and is not ambulatory  ADL's / Homemaking Assistance Needed: full assist with all ADLs        Hand Dominance        Extremity/Trunk Assessment   Upper Extremity Assessment: Overall WFL for tasks assessed  Lower Extremity Assessment: Overall WFL for tasks assessed      Cervical / Trunk Assessment: Kyphotic  Communication   Communication: Expressive difficulties  Cognition Arousal/Alertness: Awake/alert Behavior During Therapy: WFL for tasks assessed/performed Overall Cognitive Status: Within Functional Limits for tasks assessed                      General Comments      Exercises        Assessment/Plan    PT Assessment Patent does not need any further PT services  PT Diagnosis     PT Problem List    PT Treatment Interventions     PT Goals (Current goals can be found in the Care Plan section)  Acute Rehab PT Goals PT Goal Formulation: All assessment and education complete, DC therapy    Frequency     Barriers to discharge  none      Co-evaluation               End of Session Equipment Utilized During Treatment: Gait belt Activity Tolerance: Patient tolerated treatment well Patient left: in chair;with chair alarm set      Functional Assessment Tool Used: clinical judgement Functional Limitation: Mobility: Walking and moving around Mobility: Walking and Moving Around Current Status (Z6109): At least 60 percent but less than 80 percent impaired, limited or restricted Mobility: Walking and Moving Around Goal Status [phone]): At least 60 percent but less than 80 percent impaired, limited or restricted Mobility: Walking and Moving Around Discharge Status [phone]): At least 60 percent but less than 80 percent impaired, limited or restricted    Time: 0832-0901 PT Time Calculation (min) (ACUTE ONLY): 29 min   Charges:   PT Evaluation $Initial PT Evaluation Tier I: 1 Procedure     PT G Codes:   PT G-Codes **NOT FOR INPATIENT CLASS** Functional Assessment Tool Used: clinical judgement Functional Limitation: Mobility: Walking and moving around Mobility: Walking and Moving Around Current Status (B1478): At least 60 percent but less than 80 percent impaired, limited or restricted Mobility: Walking and Moving Around Goal Status [phone]): At least 60 percent but less than 80 percent impaired, limited or restricted Mobility: Walking and Moving Around Discharge Status [phone]): At least 60 percent but less than 80 percent impaired, limited or restricted    Konrad Penta 06/24/2014, 9:13 AM

## 2014-06-24 NOTE — Care Management Note (Addendum)
    Page 1 of 2   06/27/2014     10:25:37 AM CARE MANAGEMENT NOTE 06/27/2014  Patient:  Carla Bonilla,Carla Bonilla   Account Number:  [id number]  Date Initiated:  06/24/2014  Documentation initiated by:  Sharrie RothmanBLACKWELL,Mekayla Soman C  Subjective/Objective Assessment:   Pt admitted from home with syncopal episode. Pt lives alone and has 2 caregivers who are with pt most of the time. Pt has a cane and walker for home use. Pt has severe dysarthria so it is very hard to understand pt     Action/Plan:   PT recommends no follow up at discharge. Anticipate discharge within 24-48 hours.   Anticipated DC Date:  06/26/2014   Anticipated DC Plan:  HOME/SELF CARE      DC Planning Services  CM consult      [facility]   Choice offered to / List presented to:  C-1 Patient        HH arranged  HH-1 RN  HH-2 PT      HH agency  [facility]   Status of service:  Completed, signed off Medicare Important Message given?  YES (If response is "NO", the following Medicare IM given date fields will be blank) Date Medicare IM given:  06/27/2014 Medicare IM given by:  Sharrie RothmanBLACKWELL,Coralyn Roselli C Date Additional Medicare IM given:   Additional Medicare IM given by:    Discharge Disposition:  HOME W HOME HEALTH SERVICES  Per UR Regulation:    If discussed at Long Length of Stay Meetings, dates discussed:   06/27/2014    Comments:  06/27/14 1020 Arlyss Queenammy Sailor Hevia, RN BSN CM [facility]ts [facility]). Alroy BailiffLInda Lothian [facility] is aware and will collect the pts information from the chart. HH services to start within 48 hours of discharge. No DME needs noted. Pt and pts nurse aware of discharge arrangements.  06/24/14 1115 Arlyss Queenammy Luberta Grabinski, RN BSN CM

## 2014-06-24 NOTE — Progress Notes (Signed)
UR completed 

## 2014-06-24 NOTE — Consult Note (Signed)
Carla A. Merlene Laughter, MD     [email or web address]          Carla Bonilla is an 80 y.o. female.   ASSESSMENT/PLAN: 1. Episode of loss of consciousness/syncope associated with amnesia. Given the patient's previous large vessel cortical infarct, there is an increased risk of post stroke seizures. 2. Brief episode of acute confusion this afternoon possibly related to awakening from sleep. 3. Baseline marked global aphasia due to left parietal infarct in 2009. 4. Left eye blindness. 5. Hypertension. 6. Fall/gait impairment.  RECOMMENDATION: EEG  The patient is a 81 year old white female who lives by herself and is apparently sneezing a wheelchair per the chart. The patient has significant globally fascia at baseline after stroke in 2009 and this severely limits the history. It appears that the patient got up from her wheelchair to use the bathroom and fell. She reports per the chart that she did not lose consciousness. It does not appear that the event was witnessed however. She was in the emergency room because of laceration and interest her left lower extremity. In the parking lot, appears the patient had a syncopal episode which appears to be relatively brief although was associated with amnesia and loss of consciousness along with bladder or bowel incontinence. No tonic-clonic seizure activity was observed however. In the patient's room today is her neighbor. The neighbor reports that she seemed to be very irritated and confused when she walked in this a while ago. She is complaining about being confused as far as where she is located that is the patient. The patient is usually not this irritated her confused per the neighbor. I did discuss this with the nurse he reports that she was sleeping and probably awoken and was confused. It appears that after reorienting the patient's she calmed down and was less irritated. The review of system is very limited due to the severe  aphasia. The neighbor reports that the patient stays by herself but has but has a sitter who comes in 5 days a week about 4 hours. Family members are in [place].  GENERAL: The patient initially was quite irritated and the seems somewhat confused was seen to calm down after reorienting to the patient.   HEENT: Neck is supple. Head is normocephalic atraumatic.  ABDOMEN: soft  EXTREMITIES: No edema   BACK:Unremarkable  SKIN: Normal by inspection.    MENTAL STATUS: Alert. She has a moderately severe global aphasia. She does follow commands with prompting. Fluency is decent but mostly incomprehensible. Comprehension is better but impaired.  CRANIAL NERVES: The right pupil is about 7 mm and sluggishly reactive. It is somewhat spherically shaped. The left is 6 mm also sluggishly reactive. Extraocular movements are intact.There is no significant nystagmus; visual fields are appears to be full; upper and lower facial muscles are normal in strength and symmetric, there is no flattening of the nasolabial folds; tongue is midline.  MOTOR: The examination is limited due to impaired comprehension and a fascia but she does not appear to have focal weakness. Strength is at least 4/5 in upper and lower extremities. Bulk and tone are normal.  COORDINATION: Left finger to nose is normal, right finger to nose is normal, No rest tremor; no intention tremor; no postural tremor; no bradykinesia.  REFLEXES: Deep tendon reflexes are symmetrical and normal.   SENSATION: Unreliable.     Blood pressure 96/40, pulse 69, temperature 97.6 F (36.4 C), temperature source Oral, resp. rate 16, height 5' (1.524 m), weight 63.1  kg (139 lb 1.8 oz), SpO2 99 %.  Past Medical History  Diagnosis Date  . Hypertension   . Blind left eye   . Glaucoma   . Hearing loss   . CVA (cerebral vascular accident)     speech problem    Past Surgical History  Procedure Laterality Date  . Shoulder surgery      RIGHT shoulder open  treatment internal fixation with locking plate. Date of surgery November 25, 2010  . Orif humerus fracture  11/25/2010    Procedure: OPEN REDUCTION INTERNAL FIXATION (ORIF) PROXIMAL HUMERUS FRACTURE;  Surgeon: Arther Abbott, MD;  Location: [facility];  Service: Orthopedics;  Laterality: Right;  . Eye surgery      left    Family History  Problem Relation Age of Onset  . Heart disease      Social History:  reports that she has never smoked. She does not have any smokeless tobacco history on file. She reports that she does not drink alcohol or use illicit drugs.  Allergies: No Known Allergies  Medications: Prior to Admission medications   Medication Sig Start Date End Date Taking? Authorizing Provider  amLODipine (NORVASC) 5 MG tablet Take 5 mg by mouth daily.   Yes Historical Provider, MD  aspirin EC 81 MG tablet Take 81 mg by mouth daily.     Yes Historical Provider, MD  clopidogrel (PLAVIX) 75 MG tablet Take 75 mg by mouth daily.     Yes Historical Provider, MD  furosemide (LASIX) 40 MG tablet Take 40 mg by mouth daily. 01/29/13  Yes Historical Provider, MD  gabapentin (NEURONTIN) 100 MG capsule Take 1 capsule (100 mg total) by mouth 3 (three) times daily. 07/17/13  Yes Carole Civil, MD  losartan (COZAAR) 100 MG tablet Take 100 mg by mouth daily. 01/29/13  Yes Historical Provider, MD  metoprolol tartrate (LOPRESSOR) 25 MG tablet Take 25 mg by mouth 2 (two) times daily.     Yes Historical Provider, MD  moxifloxacin (VIGAMOX) 0.5 % ophthalmic solution Place 1 drop into the left eye 2 (two) times daily.    Yes Historical Provider, MD  PROAIR HFA 108 (90 BASE) MCG/ACT inhaler Inhale 2 puffs into the lungs every 6 (six) hours as needed for wheezing or shortness of breath.  01/04/13  Yes Historical Provider, MD  timolol (TIMOPTIC) 0.25 % ophthalmic solution Place 1 drop into the right eye 2 (two) times daily.    Yes Historical Provider, MD    Scheduled Meds: . amLODipine  5 mg Oral Daily    . aspirin EC  81 mg Oral Daily  . clopidogrel  75 mg Oral Daily  . gabapentin  100 mg Oral TID  . gatifloxacin  1 drop Left Eye BID  . heparin  5,000 Units Subcutaneous 3 times per day  . losartan  100 mg Oral Daily  . metoprolol tartrate  25 mg Oral BID  . midodrine  2.5 mg Oral BID WC  . sodium chloride  3 mL Intravenous Q12H  . timolol  1 drop Right Eye BID   Continuous Infusions: . sodium chloride 50 mL/hr at 06/24/14 1014   PRN Meds:.albuterol     Results for orders placed or performed during the hospital encounter of 06/22/14 (from the past 48 hour(s))  I-stat chem 8, ed     Status: Abnormal   Collection Time: 06/22/14 11:51 PM  Result Value Ref Range   Sodium 142 135 - 145 mmol/L   Potassium 4.2 3.5 -  5.1 mmol/L   Chloride 104 96 - 112 mmol/L   BUN 36 (H) 6 - 23 mg/dL   Creatinine, Ser 1.50 (H) 0.50 - 1.10 mg/dL   Glucose, Bld 120 (H) 70 - 99 mg/dL   Calcium, Ion 1.15 1.13 - 1.30 mmol/L   TCO2 25 0 - 100 mmol/L   Hemoglobin 11.9 (L) 12.0 - 15.0 g/dL   HCT 35.0 (L) 36.0 - 46.0 %  CBG monitoring, ED     Status: Abnormal   Collection Time: 06/23/14  2:55 AM  Result Value Ref Range   Glucose-Capillary 177 (H) 70 - 99 mg/dL  POC occult blood, ED     Status: Abnormal   Collection Time: 06/23/14  3:04 AM  Result Value Ref Range   Fecal Occult Bld POSITIVE (A) NEGATIVE  CBC     Status: Abnormal   Collection Time: 06/23/14  5:44 AM  Result Value Ref Range   WBC 12.8 (H) 4.0 - 10.5 K/uL   RBC 3.14 (L) 3.87 - 5.11 MIL/uL   Hemoglobin 9.9 (L) 12.0 - 15.0 g/dL    Comment: DELTA CHECK NOTED RESULT REPEATED AND VERIFIED    HCT 30.4 (L) 36.0 - 46.0 %   MCV 96.8 78.0 - 100.0 fL   MCH 31.5 26.0 - 34.0 pg   MCHC 32.6 30.0 - 36.0 g/dL   RDW 14.5 11.5 - 15.5 %   Platelets 304 150 - 400 K/uL  Creatinine, serum     Status: Abnormal   Collection Time: 06/23/14  7:30 AM  Result Value Ref Range   Creatinine, Ser 1.40 (H) 0.50 - 1.10 mg/dL   GFR calc non Af Amer 32 (L) >90  mL/min   GFR calc Af Amer 37 (L) >90 mL/min    Comment: (NOTE) The eGFR has been calculated using the CKD EPI equation. This calculation has not been validated in all clinical situations. eGFR's persistently <90 mL/min signify possible Chronic Kidney Disease.   Troponin I     Status: None   Collection Time: 06/23/14  7:30 AM  Result Value Ref Range   Troponin I <0.03 <0.031 ng/mL    Comment:        NO INDICATION OF MYOCARDIAL INJURY.   Troponin I     Status: None   Collection Time: 06/23/14 11:23 AM  Result Value Ref Range   Troponin I <0.03 <0.031 ng/mL    Comment:        NO INDICATION OF MYOCARDIAL INJURY.   Troponin I     Status: None   Collection Time: 06/23/14  6:57 PM  Result Value Ref Range   Troponin I <0.03 <0.031 ng/mL    Comment:        NO INDICATION OF MYOCARDIAL INJURY.   CBC     Status: Abnormal   Collection Time: 06/24/14  4:51 AM  Result Value Ref Range   WBC 10.3 4.0 - 10.5 K/uL   RBC 2.48 (L) 3.87 - 5.11 MIL/uL   Hemoglobin 8.0 (L) 12.0 - 15.0 g/dL    Comment: DELTA CHECK NOTED RESULT REPEATED AND VERIFIED    HCT 24.0 (L) 36.0 - 46.0 %   MCV 96.8 78.0 - 100.0 fL   MCH 32.3 26.0 - 34.0 pg   MCHC 33.3 30.0 - 36.0 g/dL   RDW 15.0 11.5 - 15.5 %   Platelets 229 150 - 400 K/uL    Comment: DELTA CHECK NOTED  Basic metabolic panel     Status: Abnormal  Collection Time: 06/24/14  4:51 AM  Result Value Ref Range   Sodium 142 135 - 145 mmol/L   Potassium 3.8 3.5 - 5.1 mmol/L   Chloride 108 96 - 112 mmol/L   CO2 27 19 - 32 mmol/L   Glucose, Bld 98 70 - 99 mg/dL   BUN 44 (H) 6 - 23 mg/dL   Creatinine, Ser 1.72 (H) 0.50 - 1.10 mg/dL   Calcium 8.3 (L) 8.4 - 10.5 mg/dL   GFR calc non Af Amer 25 (L) >90 mL/min   GFR calc Af Amer 29 (L) >90 mL/min    Comment: (NOTE) The eGFR has been calculated using the CKD EPI equation. This calculation has not been validated in all clinical situations. eGFR's persistently <90 mL/min signify possible Chronic  Kidney Disease.    Anion gap 7 5 - 15  Vitamin B12     Status: None   Collection Time: 06/24/14  9:07 AM  Result Value Ref Range   Vitamin B-12 519 211 - 911 pg/mL    Comment: Performed at [facility]  Folate     Status: None   Collection Time: 06/24/14  9:07 AM  Result Value Ref Range   Folate >20.0 ng/mL    Comment: (NOTE) Reference Ranges        Deficient:       0.4 - 3.3 ng/mL        Indeterminate:   3.4 - 5.4 ng/mL        Normal:              > 5.4 ng/mL Performed at [facility]   Iron and TIBC     Status: None   Collection Time: 06/24/14  9:07 AM  Result Value Ref Range   Iron 65 42 - 145 ug/dL   TIBC 257 250 - 470 ug/dL   Saturation Ratios 25 20 - 55 %   UIBC 192 125 - 400 ug/dL    Comment: Performed at [facility]  Ferritin     Status: None   Collection Time: 06/24/14  9:07 AM  Result Value Ref Range   Ferritin 154 10 - 291 ng/mL    Comment: Performed at [place]     Status: Abnormal   Collection Time: 06/24/14  9:07 AM  Result Value Ref Range   Retic Ct Pct 1.7 0.4 - 3.1 %   RBC. 2.90 (L) 3.87 - 5.11 MIL/uL   Retic Count, Manual 49.3 19.0 - 186.0 K/uL    Studies/Results: BRAIN MRI 2009 IMPRESSION: Acute infarct in the left frontal parietal cortex. In addition there are small areas of diffusion restriction in the occipital lobes bilaterally which may be acute or subacute small embolic infarcts in the posterior circulation.    BRAIN/CSPINE CT Clinical Data: Fall. Head and neck pain.  CT HEAD WITHOUT CONTRAST CT CERVICAL SPINE WITHOUT CONTRAST  Technique: Multidetector CT imaging of the head and cervical spine was performed following the standard protocol without intravenous contrast. Multiplanar CT image reconstructions of the cervical spine were also generated.  Comparison: 05/07/2008 [facility] hospital.  CT HEAD  Findings: No skull fracture or intracranial  hemorrhage.  Remote infarct with encephalomalacia left frontal lobe and right parietal lobe. Prominent small vessel disease type changes. No CT evidence of large acute infarct. Small acute infarct cannot be excluded by CT.  Age related atrophy without hydrocephalus. Small vessel disease type changes.  No intracranial mass lesion detected on this unenhanced  exam.  Vascular calcifications.  Tiny radiopaque structure posterior aspect of the left globe unchanged. Exophthalmos.  IMPRESSION: No skull fracture or intracranial hemorrhage.  Remote infarcts and small vessel disease type changes as detailed above.  CT CERVICAL SPINE  Findings: No cervical spine fracture. Anterior slip of C3 as noted previously. Cervical spondylotic changes with various degrees of spinal stenosis and foraminal narrowing most prominent on the right at the C5-6 level. No abnormal prevertebral soft tissue swelling.  IMPRESSION: No cervical spine fracture.  Cervical spondylotic changes as noted above.   Nilson Tabora A. Merlene Bonilla, M.D.  Diplomate, Tax adviser of Psychiatry and Neurology ( Neurology). 06/24/2014, 7:01 PM

## 2014-06-24 NOTE — Progress Notes (Signed)
Subjective: She has no new complaints. She is very difficult to understand.  her hemoglobin level has dropped and her creatinine has gone up.  Objective: Vital signs in last 24 hours: Temp:  [97.8 F (36.6 C)-98.9 F (37.2 C)] 98.9 F (37.2 C) (04/04 0649) Pulse Rate:  [64-97] 64 (04/04 0649) Resp:  [18] 18 (04/03 2225) BP: (102-155)/(40-95) 155/43 mmHg (04/04 0649) SpO2:  [100 %] 100 % (04/04 0649) Weight change:  Last BM Date: 06/23/14  Intake/Output from previous day: 04/03 0701 - 04/04 0700 In: 630 [P.O.:630] Out: 750 [Urine:750]  PHYSICAL EXAM General appearance: alert, cooperative, no distress and Dysarthric Resp: clear to auscultation bilaterally Cardio: regular rate and rhythm, S1, S2 normal, no murmur, click, rub or gallop GI: soft, non-tender; bowel sounds normal; no masses,  no organomegaly Extremities: extremities normal, atraumatic, no cyanosis or edema  Lab Results:  Results for orders placed or performed during the hospital encounter of 06/22/14 (from the past 48 hour(s))  I-stat chem 8, ed     Status: Abnormal   Collection Time: 06/22/14 11:51 PM  Result Value Ref Range   Sodium 142 135 - 145 mmol/L   Potassium 4.2 3.5 - 5.1 mmol/L   Chloride 104 96 - 112 mmol/L   BUN 36 (H) 6 - 23 mg/dL   Creatinine, Ser 1.50 (H) 0.50 - 1.10 mg/dL   Glucose, Bld 120 (H) 70 - 99 mg/dL   Calcium, Ion 1.15 1.13 - 1.30 mmol/L   TCO2 25 0 - 100 mmol/L   Hemoglobin 11.9 (L) 12.0 - 15.0 g/dL   HCT 35.0 (L) 36.0 - 46.0 %  CBG monitoring, ED     Status: Abnormal   Collection Time: 06/23/14  2:55 AM  Result Value Ref Range   Glucose-Capillary 177 (H) 70 - 99 mg/dL  POC occult blood, ED     Status: Abnormal   Collection Time: 06/23/14  3:04 AM  Result Value Ref Range   Fecal Occult Bld POSITIVE (A) NEGATIVE  CBC     Status: Abnormal   Collection Time: 06/23/14  5:44 AM  Result Value Ref Range   WBC 12.8 (H) 4.0 - 10.5 K/uL   RBC 3.14 (L) 3.87 - 5.11 MIL/uL   Hemoglobin  9.9 (L) 12.0 - 15.0 g/dL    Comment: DELTA CHECK NOTED RESULT REPEATED AND VERIFIED    HCT 30.4 (L) 36.0 - 46.0 %   MCV 96.8 78.0 - 100.0 fL   MCH 31.5 26.0 - 34.0 pg   MCHC 32.6 30.0 - 36.0 g/dL   RDW 14.5 11.5 - 15.5 %   Platelets 304 150 - 400 K/uL  Creatinine, serum     Status: Abnormal   Collection Time: 06/23/14  7:30 AM  Result Value Ref Range   Creatinine, Ser 1.40 (H) 0.50 - 1.10 mg/dL   GFR calc non Af Amer 32 (L) >90 mL/min   GFR calc Af Amer 37 (L) >90 mL/min    Comment: (NOTE) The eGFR has been calculated using the CKD EPI equation. This calculation has not been validated in all clinical situations. eGFR's persistently <90 mL/min signify possible Chronic Kidney Disease.   Troponin I     Status: None   Collection Time: 06/23/14  7:30 AM  Result Value Ref Range   Troponin I <0.03 <0.031 ng/mL    Comment:        NO INDICATION OF MYOCARDIAL INJURY.   Troponin I     Status: None   Collection Time: 06/23/14  11:23 AM  Result Value Ref Range   Troponin I <0.03 <0.031 ng/mL    Comment:        NO INDICATION OF MYOCARDIAL INJURY.   Troponin I     Status: None   Collection Time: 06/23/14  6:57 PM  Result Value Ref Range   Troponin I <0.03 <0.031 ng/mL    Comment:        NO INDICATION OF MYOCARDIAL INJURY.   CBC     Status: Abnormal   Collection Time: 06/24/14  4:51 AM  Result Value Ref Range   WBC 10.3 4.0 - 10.5 K/uL   RBC 2.48 (L) 3.87 - 5.11 MIL/uL   Hemoglobin 8.0 (L) 12.0 - 15.0 g/dL    Comment: DELTA CHECK NOTED RESULT REPEATED AND VERIFIED    HCT 24.0 (L) 36.0 - 46.0 %   MCV 96.8 78.0 - 100.0 fL   MCH 32.3 26.0 - 34.0 pg   MCHC 33.3 30.0 - 36.0 g/dL   RDW 15.0 11.5 - 15.5 %   Platelets 229 150 - 400 K/uL    Comment: DELTA CHECK NOTED  Basic metabolic panel     Status: Abnormal   Collection Time: 06/24/14  4:51 AM  Result Value Ref Range   Sodium 142 135 - 145 mmol/L   Potassium 3.8 3.5 - 5.1 mmol/L   Chloride 108 96 - 112 mmol/L   CO2 27 19  - 32 mmol/L   Glucose, Bld 98 70 - 99 mg/dL   BUN 44 (H) 6 - 23 mg/dL   Creatinine, Ser 1.72 (H) 0.50 - 1.10 mg/dL   Calcium 8.3 (L) 8.4 - 10.5 mg/dL   GFR calc non Af Amer 25 (L) >90 mL/min   GFR calc Af Amer 29 (L) >90 mL/min    Comment: (NOTE) The eGFR has been calculated using the CKD EPI equation. This calculation has not been validated in all clinical situations. eGFR's persistently <90 mL/min signify possible Chronic Kidney Disease.    Anion gap 7 5 - 15    ABGS  Recent Labs  06/22/14 2351  TCO2 25   CULTURES No results found for this or any previous visit (from the past 240 hour(s)). Studies/Results: Ct Head Wo Contrast  06/23/2014   CLINICAL DATA:  Acute onset of generalized weakness. Recent fall. Unresponsive. Initial encounter.  EXAM: CT HEAD WITHOUT CONTRAST  TECHNIQUE: Contiguous axial images were obtained from the base of the skull through the vertex without intravenous contrast.  COMPARISON:  CT of the head performed 10/07/2013, and MRI of the brain performed 10/05/2013  FINDINGS: There is no evidence of acute infarction, mass lesion, or intra- or extra-axial hemorrhage on CT.  Prominence of the ventricles and sulci reflects moderate cortical volume loss. A chronic infarct is noted at the left parietal lobe, with associated encephalomalacia. A smaller chronic infarct is also noted at the right posterior parietal lobe, with associated encephalomalacia. Chronic infarct is noted at the right cerebellar hemisphere. Cerebellar atrophy is noted. Scattered periventricular and subcortical white matter change likely reflects small vessel ischemic microangiopathy.  The brainstem and fourth ventricle are within normal limits. No mass effect or midline shift is seen.  There is no evidence of fracture; visualized osseous structures are unremarkable in appearance. The orbits are within normal limits. There is partial opacification of the right mastoid air cells. The paranasal sinuses and  left mastoid air cells are well-aerated. No significant soft tissue abnormalities are seen.  IMPRESSION: 1. No acute intracranial pathology  seen on CT. 2. Moderate cortical volume loss and scattered small vessel ischemic microangiopathy. 3. Chronic infarct at the left parietal lobe, with associated encephalomalacia. Smaller chronic infarct at the right posterior parietal lobe, with associated encephalomalacia. Chronic infarct at the right cerebellar hemisphere. 4. Partial opacification of the right mastoid air cells.   Electronically Signed   By: Garald Balding M.D.   On: 06/23/2014 03:40   Dg Tibia/fibula Left Port  06/23/2014   ADDENDUM REPORT: 06/23/2014 01:10  ADDENDUM: Two views of the left lower leg are negative for fracture, dislocation or radiopaque foreign body. No acute soft tissue abnormalities are evident. Moderate arthritic changes are noted about the knee.  IMPRESSION: Negative for acute fracture or acute soft tissue abnormality.   Electronically Signed   By: Andreas Newport M.D.   On: 06/23/2014 01:10   06/23/2014   CLINICAL DATA:  Pain after falling 1 hr ago while getting out of her wheelchair.  EXAM: PORTABLE LEFT TIBIA AND FIBULA - 2 VIEW  COMPARISON:  None.  Electronically Signed: By: Andreas Newport M.D. On: 06/23/2014 00:20    Medications:  Prior to Admission:  Prescriptions prior to admission  Medication Sig Dispense Refill Last Dose  . amLODipine (NORVASC) 5 MG tablet Take 5 mg by mouth daily.   Past Month at Unknown time  . aspirin EC 81 MG tablet Take 81 mg by mouth daily.     06/22/2014 at Unknown time  . clopidogrel (PLAVIX) 75 MG tablet Take 75 mg by mouth daily.     06/22/2014 at Unknown time  . furosemide (LASIX) 40 MG tablet Take 40 mg by mouth daily.   06/22/2014 at Unknown time  . gabapentin (NEURONTIN) 100 MG capsule Take 1 capsule (100 mg total) by mouth 3 (three) times daily. 90 capsule 5 06/22/2014 at Unknown time  . losartan (COZAAR) 100 MG tablet Take 100 mg by mouth  daily.   06/22/2014 at Unknown time  . metoprolol tartrate (LOPRESSOR) 25 MG tablet Take 25 mg by mouth 2 (two) times daily.     06/22/2014 at 1900  . moxifloxacin (VIGAMOX) 0.5 % ophthalmic solution Place 1 drop into the left eye 2 (two) times daily.    06/22/2014 at Unknown time  . PROAIR HFA 108 (90 BASE) MCG/ACT inhaler Inhale 2 puffs into the lungs every 6 (six) hours as needed for wheezing or shortness of breath.    06/22/2014 at Unknown time  . timolol (TIMOPTIC) 0.25 % ophthalmic solution Place 1 drop into the right eye 2 (two) times daily.    06/22/2014 at Unknown time  . alendronate (FOSAMAX) 70 MG tablet Take 70 mg by mouth once a week.   unknown   Scheduled: . amLODipine  5 mg Oral Daily  . aspirin EC  81 mg Oral Daily  . clopidogrel  75 mg Oral Daily  . gabapentin  100 mg Oral TID  . gatifloxacin  1 drop Left Eye BID  . heparin  5,000 Units Subcutaneous 3 times per day  . losartan  100 mg Oral Daily  . metoprolol tartrate  25 mg Oral BID  . sodium chloride  3 mL Intravenous Q12H  . timolol  1 drop Right Eye BID   Continuous: . sodium chloride     GHW:[id number]  Assesment: She was admitted with syncope. She has acute kidney injury. She has had a severe stroke with residual defects which makes her history difficult. No seizure activity was noted when she had her syncopal  episode. She denies any chest pain and has ruled out for MI.  Her hemoglobin level has dropped and this is not delusional because she's not been receiving IV fluids. This will need workup.  Her creatinine has gone up and I think some of it may be that she's not getting enough oral fluids on going to put her on some IV fluids now. Principal Problem:   Syncope and collapse Active Problems:   H/O: stroke with residual effects   Spinal stenosis of lumbar region with radiculopathy   Syncope   Laceration    Plan: She will be admitted. Neurology consult. Start IV fluids. Stools for blood. Anemia profile.    LOS: 0  days   Amador Braddy L 06/24/2014, 8:14 AM

## 2014-06-25 ENCOUNTER — Encounter (HOSPITAL_COMMUNITY)
Admission: EM | Disposition: A | Discharge status: Home Health | Payer: Self-pay | Source: Home / Self Care | Attending: Pulmonary Disease

## 2014-06-25 ENCOUNTER — Inpatient Hospital Stay (HOSPITAL_COMMUNITY)
Admit: 2014-06-25 | Discharge: 2014-06-25 | Disposition: A | Discharge status: Home / Self Care | Payer: Medicare Other | Attending: Neurology | Admitting: Neurology

## 2014-06-25 ENCOUNTER — Encounter (HOSPITAL_COMMUNITY): Payer: Self-pay | Admitting: Gastroenterology

## 2014-06-25 DIAGNOSIS — R195 Other fecal abnormalities: Secondary | ICD-10-CM | POA: Clinically undetermined

## 2014-06-25 DIAGNOSIS — D62 Acute posthemorrhagic anemia: Secondary | ICD-10-CM | POA: Diagnosis not present

## 2014-06-25 LAB — CBC WITH DIFFERENTIAL/PLATELET
BASOS ABS: 0.1 10*3/uL (ref 0.0–0.1)
Basophils Relative: 1 % (ref 0–1)
Eosinophils Absolute: 0.5 10*3/uL (ref 0.0–0.7)
Eosinophils Relative: 5 % (ref 0–5)
HEMATOCRIT: 23 % — AB (ref 36.0–46.0)
Hemoglobin: 7.5 g/dL — ABNORMAL LOW (ref 12.0–15.0)
LYMPHS PCT: 37 % (ref 12–46)
Lymphs Abs: 3.5 10*3/uL (ref 0.7–4.0)
MCH: 31.6 pg (ref 26.0–34.0)
MCHC: 32.6 g/dL (ref 30.0–36.0)
MCV: 97 fL (ref 78.0–100.0)
Monocytes Absolute: 0.8 10*3/uL (ref 0.1–1.0)
Monocytes Relative: 8 % (ref 3–12)
Neutro Abs: 4.5 10*3/uL (ref 1.7–7.7)
Neutrophils Relative %: 49 % (ref 43–77)
Platelets: 245 10*3/uL (ref 150–400)
RBC: 2.37 MIL/uL — ABNORMAL LOW (ref 3.87–5.11)
RDW: 15 % (ref 11.5–15.5)
WBC: 9.4 10*3/uL (ref 4.0–10.5)

## 2014-06-25 LAB — BASIC METABOLIC PANEL
Anion gap: 6 (ref 5–15)
BUN: 41 mg/dL — ABNORMAL HIGH (ref 6–23)
CHLORIDE: 110 mmol/L (ref 96–112)
CO2: 25 mmol/L (ref 19–32)
Calcium: 8.1 mg/dL — ABNORMAL LOW (ref 8.4–10.5)
Creatinine, Ser: 1.42 mg/dL — ABNORMAL HIGH (ref 0.50–1.10)
GFR calc Af Amer: 36 mL/min — ABNORMAL LOW (ref >90)
GFR, EST NON AFRICAN AMERICAN: 31 mL/min — AB (ref >90)
GLUCOSE: 96 mg/dL (ref 70–99)
POTASSIUM: 4 mmol/L (ref 3.5–5.1)
Sodium: 141 mmol/L (ref 135–145)

## 2014-06-25 LAB — HEMOGLOBIN AND HEMATOCRIT, BLOOD
HCT: 25 % — ABNORMAL LOW (ref 36.0–46.0)
HEMOGLOBIN: 7.9 g/dL — AB (ref 12.0–15.0)

## 2014-06-25 LAB — VITAMIN B12: VITAMIN B 12: 696 pg/mL (ref 211–911)

## 2014-06-25 SURGERY — EGD (ESOPHAGOGASTRODUODENOSCOPY)
Anesthesia: Moderate Sedation

## 2014-06-25 MED ORDER — PANTOPRAZOLE SODIUM 40 MG PO TBEC
40.0000 mg | DELAYED_RELEASE_TABLET | Freq: Two times a day (BID) | ORAL | Status: DC
  Administered 2014-06-25 – 2014-06-27 (×5): 40 mg via ORAL
  Filled 2014-06-25 (×5): qty 1

## 2014-06-25 NOTE — Progress Notes (Signed)
Patient ID: Carla Bonilla, female   DOB: [birth year], 81 y.o.   MRN: [id number]  Alder A. Merlene Laughter, MD     [email or web address]          Carla Bonilla is an 81 y.o. female.   Assessment/Plan: 1. Episode of loss of consciousness/syncope associated with amnesia. Given the patient's previous large vessel cortical infarct, there is an increased risk of post stroke seizures. 2. Brief episode of acute confusion this afternoon possibly related to awakening from sleep. 3. Baseline marked global aphasia due to left parietal infarct in 2009. 4. Left eye blindness. 5. Hypertension. 6. Fall/gait impairment. 7. Recurrent nosebleeds on dual antiplatelet agents with positive guaiac stool.  RECOMMENDATION: We strongly recommend that the patient be taken no dual antiplatelet agents. A single agent is recommended once her EGD is completed. Plavix is my recommendation. No need for anticoagulation from my standpoint. Compression stockings can be used for DVT prophylaxis. Given the normal EEG, seizure medications are not recommended at this time.   The patient has had a drop in her hemoglobin. GI has seen her. She is on dual antiplatelet agents.   GENERAL: No complaints.   HEENT: Neck is supple. Head is normocephalic atraumatic.  ABDOMEN: soft  EXTREMITIES: No edema   BACK:Unremarkable  SKIN: Normal by inspection.   MENTAL STATUS: Alert. Much more responsive today. She has a moderately severe global aphasia. She does follow commands with prompting. Fluency is decent but still incomprehensible. Comprehension is better but impaired.  CRANIAL NERVES: The right pupil is about 7 mm and sluggishly reactive. It is somewhat spherically shaped. The left is 6 mm also sluggishly reactive. Extraocular movements are intact.There is no significant nystagmus; visual fields are appears to be full; upper and lower facial muscles are normal in strength and symmetric, there is no  flattening of the nasolabial folds; tongue is midline.  MOTOR: The examination is limited due to impaired comprehension and a fascia but she does not appear to have focal weakness. Strength is at least 4/5 in upper and lower extremities. Bulk and tone are normal.  COORDINATION: Left finger to nose is normal, right finger to nose is normal, No rest tremor; no intention tremor; no postural tremor; no bradykinesia.      Objective: Vital signs in last 24 hours: Temp:  [97.7 F (36.5 C)-98.4 F (36.9 C)] 98.3 F (36.8 C) (04/05 1420) Pulse Rate:  [65-84] 65 (04/05 1420) Resp:  [18] 18 (04/05 1420) BP: (100-176)/(37-94) 100/41 mmHg (04/05 1420) SpO2:  [98 %-100 %] 100 % (04/05 1420)  Intake/Output from previous day: 04/04 0701 - 04/05 0700 In: 1701.7 [P.O.:720; I.V.:981.7] Out: 1050 [Urine:1050] Intake/Output this shift: Total I/O In: 1086.7 [P.O.:480; I.V.:606.7] Out: 500 [Urine:500] Nutritional status: DIET DYS 3 Room service appropriate?: Yes; Fluid consistency:: Thin   Lab Results: Results for orders placed or performed during the hospital encounter of 06/22/14 (from the past 48 hour(s))  Troponin I     Status: None   Collection Time: 06/23/14  6:57 PM  Result Value Ref Range   Troponin I <0.03 <0.031 ng/mL    Comment:        NO INDICATION OF MYOCARDIAL INJURY.   CBC     Status: Abnormal   Collection Time: 06/24/14  4:51 AM  Result Value Ref Range   WBC 10.3 4.0 - 10.5 K/uL   RBC 2.48 (L) 3.87 - 5.11 MIL/uL   Hemoglobin 8.0 (L) 12.0 - 15.0 g/dL    Comment: DELTA  CHECK NOTED RESULT REPEATED AND VERIFIED    HCT 24.0 (L) 36.0 - 46.0 %   MCV 96.8 78.0 - 100.0 fL   MCH 32.3 26.0 - 34.0 pg   MCHC 33.3 30.0 - 36.0 g/dL   RDW 15.0 11.5 - 15.5 %   Platelets 229 150 - 400 K/uL    Comment: DELTA CHECK NOTED  Basic metabolic panel     Status: Abnormal   Collection Time: 06/24/14  4:51 AM  Result Value Ref Range   Sodium 142 135 - 145 mmol/L   Potassium 3.8 3.5 - 5.1  mmol/L   Chloride 108 96 - 112 mmol/L   CO2 27 19 - 32 mmol/L   Glucose, Bld 98 70 - 99 mg/dL   BUN 44 (H) 6 - 23 mg/dL   Creatinine, Ser 1.72 (H) 0.50 - 1.10 mg/dL   Calcium 8.3 (L) 8.4 - 10.5 mg/dL   GFR calc non Af Amer 25 (L) >90 mL/min   GFR calc Af Amer 29 (L) >90 mL/min    Comment: (NOTE) The eGFR has been calculated using the CKD EPI equation. This calculation has not been validated in all clinical situations. eGFR's persistently <90 mL/min signify possible Chronic Kidney Disease.    Anion gap 7 5 - 15  Vitamin B12     Status: None   Collection Time: 06/24/14  9:07 AM  Result Value Ref Range   Vitamin B-12 519 211 - 911 pg/mL    Comment: Performed at [facility]  Folate     Status: None   Collection Time: 06/24/14  9:07 AM  Result Value Ref Range   Folate >20.0 ng/mL    Comment: (NOTE) Reference Ranges        Deficient:       0.4 - 3.3 ng/mL        Indeterminate:   3.4 - 5.4 ng/mL        Normal:              > 5.4 ng/mL Performed at [facility]   Iron and TIBC     Status: None   Collection Time: 06/24/14  9:07 AM  Result Value Ref Range   Iron 65 42 - 145 ug/dL   TIBC 257 250 - 470 ug/dL   Saturation Ratios 25 20 - 55 %   UIBC 192 125 - 400 ug/dL    Comment: Performed at [facility]  Ferritin     Status: None   Collection Time: 06/24/14  9:07 AM  Result Value Ref Range   Ferritin 154 10 - 291 ng/mL    Comment: Performed at [facility]  Reticulocytes     Status: Abnormal   Collection Time: 06/24/14  9:07 AM  Result Value Ref Range   Retic Ct Pct 1.7 0.4 - 3.1 %   RBC. 2.90 (L) 3.87 - 5.11 MIL/uL   Retic Count, Manual 49.3 19.0 - 186.0 K/uL  CBC with Differential/Platelet     Status: Abnormal   Collection Time: 06/25/14  6:37 AM  Result Value Ref Range   WBC 9.4 4.0 - 10.5 K/uL   RBC 2.37 (L) 3.87 - 5.11 MIL/uL   Hemoglobin 7.5 (L) 12.0 - 15.0 g/dL   HCT 23.0 (L) 36.0 - 46.0 %   MCV 97.0 78.0 - 100.0 fL   MCH 31.6  26.0 - 34.0 pg   MCHC 32.6 30.0 - 36.0 g/dL   RDW 15.0 11.5 - 15.5 %  Platelets 245 150 - 400 K/uL   Neutrophils Relative % 49 43 - 77 %   Neutro Abs 4.5 1.7 - 7.7 K/uL   Lymphocytes Relative 37 12 - 46 %   Lymphs Abs 3.5 0.7 - 4.0 K/uL   Monocytes Relative 8 3 - 12 %   Monocytes Absolute 0.8 0.1 - 1.0 K/uL   Eosinophils Relative 5 0 - 5 %   Eosinophils Absolute 0.5 0.0 - 0.7 K/uL   Basophils Relative 1 0 - 1 %   Basophils Absolute 0.1 0.0 - 0.1 K/uL  Basic metabolic panel     Status: Abnormal   Collection Time: 06/25/14  6:37 AM  Result Value Ref Range   Sodium 141 135 - 145 mmol/L   Potassium 4.0 3.5 - 5.1 mmol/L   Chloride 110 96 - 112 mmol/L   CO2 25 19 - 32 mmol/L   Glucose, Bld 96 70 - 99 mg/dL   BUN 41 (H) 6 - 23 mg/dL   Creatinine, Ser 1.42 (H) 0.50 - 1.10 mg/dL   Calcium 8.1 (L) 8.4 - 10.5 mg/dL   GFR calc non Af Amer 31 (L) >90 mL/min   GFR calc Af Amer 36 (L) >90 mL/min    Comment: (NOTE) The eGFR has been calculated using the CKD EPI equation. This calculation has not been validated in all clinical situations. eGFR's persistently <90 mL/min signify possible Chronic Kidney Disease.    Anion gap 6 5 - 15  Vitamin B12     Status: None   Collection Time: 06/25/14  6:37 AM  Result Value Ref Range   Vitamin B-12 696 211 - 911 pg/mL    Comment: Performed at [facility]  Hemoglobin and hematocrit, blood     Status: Abnormal   Collection Time: 06/25/14  3:51 PM  Result Value Ref Range   Hemoglobin 7.9 (L) 12.0 - 15.0 g/dL   HCT 25.0 (L) 36.0 - 46.0 %    Lipid Panel No results for input(s): CHOL, TRIG, HDL, CHOLHDL, VLDL, LDLCALC in the last 72 hours.  Studies/Results:   Medications:  Scheduled Meds: . amLODipine  5 mg Oral Daily  . gabapentin  100 mg Oral TID  . gatifloxacin  1 drop Left Eye BID  . losartan  100 mg Oral Daily  . metoprolol tartrate  25 mg Oral BID  . midodrine  2.5 mg Oral BID WC  . pantoprazole  40 mg Oral BID AC  . sodium  chloride  3 mL Intravenous Q12H  . timolol  1 drop Right Eye BID   Continuous Infusions: . sodium chloride 50 mL/hr at 06/25/14 0628   PRN Meds:.albuterol     LOS: 1 day   Ishmail Mcmanamon A. Merlene Laughter, M.D.  Diplomate, Tax adviser of Psychiatry and Neurology ( Neurology).

## 2014-06-25 NOTE — Progress Notes (Signed)
Subjective: She says she feels okay. Her stool is positive for blood and her hemoglobin has dropped somewhat. Anemia profile is essentially normal.  Objective: Vital signs in last 24 hours: Temp:  [97.6 F (36.4 C)-98.4 F (36.9 C)] 98.4 F (36.9 C) (04/05 0610) Pulse Rate:  [63-84] 75 (04/05 0610) Resp:  [16-18] 18 (04/05 0610) BP: (92-176)/(36-94) 119/82 mmHg (04/05 0610) SpO2:  [99 %-100 %] 100 % (04/04 2229) Weight change:  Last BM Date: 06/23/14  Intake/Output from previous day: 04/04 0701 - 04/05 0700 In: 1701.7 [P.O.:720; I.V.:981.7] Out: 1050 [Urine:1050]  PHYSICAL EXAM General appearance: alert, cooperative, no distress and With severe aphasia Resp: clear to auscultation bilaterally Cardio: regular rate and rhythm, S1, S2 normal, no murmur, click, rub or gallop GI: soft, non-tender; bowel sounds normal; no masses,  no organomegaly Extremities: extremities normal, atraumatic, no cyanosis or edema  Lab Results:  Results for orders placed or performed during the hospital encounter of 06/22/14 (from the past 48 hour(s))  Troponin I     Status: None   Collection Time: 06/23/14 11:23 AM  Result Value Ref Range   Troponin I <0.03 <0.031 ng/mL    Comment:        NO INDICATION OF MYOCARDIAL INJURY.   Troponin I     Status: None   Collection Time: 06/23/14  6:57 PM  Result Value Ref Range   Troponin I <0.03 <0.031 ng/mL    Comment:        NO INDICATION OF MYOCARDIAL INJURY.   CBC     Status: Abnormal   Collection Time: 06/24/14  4:51 AM  Result Value Ref Range   WBC 10.3 4.0 - 10.5 K/uL   RBC 2.48 (L) 3.87 - 5.11 MIL/uL   Hemoglobin 8.0 (L) 12.0 - 15.0 g/dL    Comment: DELTA CHECK NOTED RESULT REPEATED AND VERIFIED    HCT 24.0 (L) 36.0 - 46.0 %   MCV 96.8 78.0 - 100.0 fL   MCH 32.3 26.0 - 34.0 pg   MCHC 33.3 30.0 - 36.0 g/dL   RDW 15.0 11.5 - 15.5 %   Platelets 229 150 - 400 K/uL    Comment: DELTA CHECK NOTED  Basic metabolic panel     Status: Abnormal    Collection Time: 06/24/14  4:51 AM  Result Value Ref Range   Sodium 142 135 - 145 mmol/L   Potassium 3.8 3.5 - 5.1 mmol/L   Chloride 108 96 - 112 mmol/L   CO2 27 19 - 32 mmol/L   Glucose, Bld 98 70 - 99 mg/dL   BUN 44 (H) 6 - 23 mg/dL   Creatinine, Ser 1.72 (H) 0.50 - 1.10 mg/dL   Calcium 8.3 (L) 8.4 - 10.5 mg/dL   GFR calc non Af Amer 25 (L) >90 mL/min   GFR calc Af Amer 29 (L) >90 mL/min    Comment: (NOTE) The eGFR has been calculated using the CKD EPI equation. This calculation has not been validated in all clinical situations. eGFR's persistently <90 mL/min signify possible Chronic Kidney Disease.    Anion gap 7 5 - 15  Vitamin B12     Status: None   Collection Time: 06/24/14  9:07 AM  Result Value Ref Range   Vitamin B-12 519 211 - 911 pg/mL    Comment: Performed at [facility]  Folate     Status: None   Collection Time: 06/24/14  9:07 AM  Result Value Ref Range   Folate >20.0 ng/mL  Comment: (NOTE) Reference Ranges        Deficient:       0.4 - 3.3 ng/mL        Indeterminate:   3.4 - 5.4 ng/mL        Normal:              > 5.4 ng/mL Performed at [facility]   Iron and TIBC     Status: None   Collection Time: 06/24/14  9:07 AM  Result Value Ref Range   Iron 65 42 - 145 ug/dL   TIBC 257 250 - 470 ug/dL   Saturation Ratios 25 20 - 55 %   UIBC 192 125 - 400 ug/dL    Comment: Performed at [facility]  Ferritin     Status: None   Collection Time: 06/24/14  9:07 AM  Result Value Ref Range   Ferritin 154 10 - 291 ng/mL    Comment: Performed at [facility]  Reticulocytes     Status: Abnormal   Collection Time: 06/24/14  9:07 AM  Result Value Ref Range   Retic Ct Pct 1.7 0.4 - 3.1 %   RBC. 2.90 (L) 3.87 - 5.11 MIL/uL   Retic Count, Manual 49.3 19.0 - 186.0 K/uL  CBC with Differential/Platelet     Status: Abnormal   Collection Time: 06/25/14  6:37 AM  Result Value Ref Range   WBC 9.4 4.0 - 10.5 K/uL   RBC 2.37 (L) 3.87 - 5.11  MIL/uL   Hemoglobin 7.5 (L) 12.0 - 15.0 g/dL   HCT 23.0 (L) 36.0 - 46.0 %   MCV 97.0 78.0 - 100.0 fL   MCH 31.6 26.0 - 34.0 pg   MCHC 32.6 30.0 - 36.0 g/dL   RDW 15.0 11.5 - 15.5 %   Platelets 245 150 - 400 K/uL   Neutrophils Relative % 49 43 - 77 %   Neutro Abs 4.5 1.7 - 7.7 K/uL   Lymphocytes Relative 37 12 - 46 %   Lymphs Abs 3.5 0.7 - 4.0 K/uL   Monocytes Relative 8 3 - 12 %   Monocytes Absolute 0.8 0.1 - 1.0 K/uL   Eosinophils Relative 5 0 - 5 %   Eosinophils Absolute 0.5 0.0 - 0.7 K/uL   Basophils Relative 1 0 - 1 %   Basophils Absolute 0.1 0.0 - 0.1 K/uL  Basic metabolic panel     Status: Abnormal   Collection Time: 06/25/14  6:37 AM  Result Value Ref Range   Sodium 141 135 - 145 mmol/L   Potassium 4.0 3.5 - 5.1 mmol/L   Chloride 110 96 - 112 mmol/L   CO2 25 19 - 32 mmol/L   Glucose, Bld 96 70 - 99 mg/dL   BUN 41 (H) 6 - 23 mg/dL   Creatinine, Ser 1.42 (H) 0.50 - 1.10 mg/dL   Calcium 8.1 (L) 8.4 - 10.5 mg/dL   GFR calc non Af Amer 31 (L) >90 mL/min   GFR calc Af Amer 36 (L) >90 mL/min    Comment: (NOTE) The eGFR has been calculated using the CKD EPI equation. This calculation has not been validated in all clinical situations. eGFR's persistently <90 mL/min signify possible Chronic Kidney Disease.    Anion gap 6 5 - 15    ABGS  Recent Labs  06/22/14 2351  TCO2 25   CULTURES No results found for this or any previous visit (from the past 240 hour(s)). Studies/Results: No results found.  Medications:  Prior to Admission:  Prescriptions prior to admission  Medication Sig Dispense Refill Last Dose  . amLODipine (NORVASC) 5 MG tablet Take 5 mg by mouth daily.   Past Month at Unknown time  . aspirin EC 81 MG tablet Take 81 mg by mouth daily.     06/22/2014 at Unknown time  . clopidogrel (PLAVIX) 75 MG tablet Take 75 mg by mouth daily.     06/22/2014 at Unknown time  . furosemide (LASIX) 40 MG tablet Take 40 mg by mouth daily.   06/22/2014 at Unknown time  .  gabapentin (NEURONTIN) 100 MG capsule Take 1 capsule (100 mg total) by mouth 3 (three) times daily. 90 capsule 5 06/22/2014 at Unknown time  . losartan (COZAAR) 100 MG tablet Take 100 mg by mouth daily.   06/22/2014 at Unknown time  . metoprolol tartrate (LOPRESSOR) 25 MG tablet Take 25 mg by mouth 2 (two) times daily.     06/22/2014 at 1900  . moxifloxacin (VIGAMOX) 0.5 % ophthalmic solution Place 1 drop into the left eye 2 (two) times daily.    06/22/2014 at Unknown time  . PROAIR HFA 108 (90 BASE) MCG/ACT inhaler Inhale 2 puffs into the lungs every 6 (six) hours as needed for wheezing or shortness of breath.    06/22/2014 at Unknown time  . timolol (TIMOPTIC) 0.25 % ophthalmic solution Place 1 drop into the right eye 2 (two) times daily.    06/22/2014 at Unknown time   Scheduled: . amLODipine  5 mg Oral Daily  . aspirin EC  81 mg Oral Daily  . clopidogrel  75 mg Oral Daily  . gabapentin  100 mg Oral TID  . gatifloxacin  1 drop Left Eye BID  . heparin  5,000 Units Subcutaneous 3 times per day  . losartan  100 mg Oral Daily  . metoprolol tartrate  25 mg Oral BID  . midodrine  2.5 mg Oral BID WC  . sodium chloride  3 mL Intravenous Q12H  . timolol  1 drop Right Eye BID   Continuous: . sodium chloride 50 mL/hr at 06/25/14 2763   REV:[id number]  Assesment: She had syncope. She does have orthostatic blood pressure changes which are better since she has started midday drain. Some of this may be related to GI bleeding because she has a hemoglobin now of 7.5 and heme positive stool. Principal Problem:   Syncope and collapse Active Problems:   H/O: stroke with residual effects   Spinal stenosis of lumbar region with radiculopathy   Syncope   Laceration    Plan: GI consultation she doesn't need a blood transfusion at this point but will recheck her hemoglobin and hematocrit later today.    LOS: 1 day   Eziah Negro L 06/25/2014, 8:36 AM

## 2014-06-25 NOTE — Progress Notes (Signed)
EEG Completed; Results Pending  

## 2014-06-25 NOTE — Progress Notes (Signed)
Patient having nose bleeds.  Notified MD and received verbal order to stop Aspirin at this time.  Will continue to monitor patient.

## 2014-06-25 NOTE — Consult Note (Signed)
Referring Provider: Kari Baars, MD Primary Care Physician:  Fredirick Maudlin, MD Primary Gastroenterologist:  Jonette Eva, MD  Reason for Consultation:  Anemia, heme+stool  HPI: Carla Bonilla is a 81 y.o. female with h/o HTN, glaucoma, prior CVA with expressive aphasia who has been having syncopal episodes and falls. Neurology evaluating. She has been on Plavix and ASA. She has been noted to have drop in Hgb this admission and is heme positive.   Patient reports minor nosebleeds intermittently for several weeks at least. Denies abdominal pain, heartburn, dysphagia, vomiting, anorexia, diarrhea, melena. ?brbpr at times. Occasional constipation. Denies NSAIDs. No prior EGD/colonoscopy.   Review of EPIC shows Hgb of 9 in 09/2013. On admission (3 days ago) her Hgb was 11.9. Has dropped daily and currently 7.5. No reported melena. She is heme +. Iron, B12, folate all normal. BUN 41, Creatinine 1.42.   Prior to Admission medications   Medication Sig Start Date End Date Taking? Authorizing Provider  amLODipine (NORVASC) 5 MG tablet Take 5 mg by mouth daily.   Yes Historical Provider, MD  aspirin EC 81 MG tablet Take 81 mg by mouth daily.     Yes Historical Provider, MD  clopidogrel (PLAVIX) 75 MG tablet Take 75 mg by mouth daily.     Yes Historical Provider, MD  furosemide (LASIX) 40 MG tablet Take 40 mg by mouth daily. 01/29/13  Yes Historical Provider, MD  gabapentin (NEURONTIN) 100 MG capsule Take 1 capsule (100 mg total) by mouth 3 (three) times daily. 07/17/13  Yes Vickki Hearing, MD  losartan (COZAAR) 100 MG tablet Take 100 mg by mouth daily. 01/29/13  Yes Historical Provider, MD  metoprolol tartrate (LOPRESSOR) 25 MG tablet Take 25 mg by mouth 2 (two) times daily.     Yes Historical Provider, MD  moxifloxacin (VIGAMOX) 0.5 % ophthalmic solution Place 1 drop into the left eye 2 (two) times daily.    Yes Historical Provider, MD  PROAIR HFA 108 (90 BASE) MCG/ACT inhaler Inhale 2 puffs  into the lungs every 6 (six) hours as needed for wheezing or shortness of breath.  01/04/13  Yes Historical Provider, MD  timolol (TIMOPTIC) 0.25 % ophthalmic solution Place 1 drop into the right eye 2 (two) times daily.    Yes Historical Provider, MD    Current Facility-Administered Medications  Medication Dose Route Frequency Provider Last Rate Last Dose  . 0.9 %  sodium chloride infusion   Intravenous Continuous Kari Baars, MD 50 mL/hr at 06/25/14 [phone]    . albuterol (PROVENTIL) (2.5 MG/3ML) 0.083% nebulizer solution 2.5 mg  2.5 mg Nebulization Q6H PRN Kari Baars, MD      . amLODipine (NORVASC) tablet 5 mg  5 mg Oral Daily Houston Siren, MD   5 mg at 06/24/14 1013  . aspirin EC tablet 81 mg  81 mg Oral Daily Houston Siren, MD   81 mg at 06/24/14 1013  . gabapentin (NEURONTIN) capsule 100 mg  100 mg Oral TID Houston Siren, MD   100 mg at 06/24/14 2117  . gatifloxacin (ZYMAXID) 0.5 % ophthalmic drops 1 drop  1 drop Left Eye BID Avon Gully, MD   1 drop at 06/24/14 2117  . heparin injection 5,000 Units  5,000 Units Subcutaneous 3 times per day Houston Siren, MD   5,000 Units at 06/25/14 0543  . losartan (COZAAR) tablet 100 mg  100 mg Oral Daily Houston Siren, MD   100 mg at 06/24/14 1012  . metoprolol tartrate (LOPRESSOR) tablet 25  mg  25 mg Oral BID Houston SirenPeter Le, MD   25 mg at 06/24/14 2117  . midodrine (PROAMATINE) tablet 2.5 mg  2.5 mg Oral BID WC Kari BaarsEdward Hawkins, MD   2.5 mg at 06/24/14 1709  . sodium chloride 0.9 % injection 3 mL  3 mL Intravenous Q12H Houston SirenPeter Le, MD   3 mL at 06/24/14 1012  . timolol (TIMOPTIC) 0.25 % ophthalmic solution 1 drop  1 drop Right Eye BID Avon Gullyesfaye Fanta, MD   1 drop at 06/24/14 2117    Allergies as of 06/22/2014  . (No Known Allergies)    Past Medical History  Diagnosis Date  . Hypertension   . Blind left eye   . Glaucoma   . Hearing loss   . CVA (cerebral vascular accident)     speech problem    Past Surgical History  Procedure Laterality Date  . Shoulder surgery       RIGHT shoulder open treatment internal fixation with locking plate. Date of surgery November 25, 2010  . Orif humerus fracture  11/25/2010    Procedure: OPEN REDUCTION INTERNAL FIXATION (ORIF) PROXIMAL HUMERUS FRACTURE;  Surgeon: Fuller CanadaStanley Harrison, MD;  Location: [facility];  Service: Orthopedics;  Laterality: Right;  . Eye surgery      left    Family History  Problem Relation Age of Onset  . Heart disease    . Colon cancer Neg Hx     History   Social History  . Marital Status: Widowed    Spouse Name: N/A  . Number of Children: N/A  . Years of Education: 12+   Occupational History  .     Social History Main Topics  . Smoking status: Never Smoker   . Smokeless tobacco: Not on file  . Alcohol Use: No  . Drug Use: No  . Sexual Activity: Not on file   Other Topics Concern  . Not on file   Social History Narrative     ROS:  General: Negative for anorexia, weight loss, fever, chills, fatigue, weakness. Eyes: Negative for vision changes. Blind left eye. ENT: Negative for hoarseness, difficulty swallowing , nasal congestion. C/o nosebleed, minor CV: Negative for chest pain, angina, palpitations, dyspnea on exertion, peripheral edema.  Respiratory: Negative for dyspnea at rest, dyspnea on exertion, cough, sputum, wheezing.  GI: See history of present illness. GU:  Negative for dysuria, hematuria, urinary incontinence, urinary frequency, nocturnal urination.  MS: Negative for joint pain, low back pain.  Derm: Negative for rash or itching.  Neuro: see hpi Psych: Negative for anxiety, depression, suicidal ideation, hallucinations.  Endo: Negative for unusual weight change.  Heme: Negative for bruising or bleeding. Allergy: Negative for rash or hives.       Physical Examination: Vital signs in last 24 hours: Temp:  [97.6 F (36.4 C)-98.4 F (36.9 C)] 98.4 F (36.9 C) (04/05 0610) Pulse Rate:  [63-84] 75 (04/05 0610) Resp:  [16-18] 18 (04/05 0610) BP: (92-176)/(36-94) 119/82  mmHg (04/05 0610) SpO2:  [99 %-100 %] 100 % (04/04 2229) Last BM Date: 06/23/14  General: Well-nourished, well-developed in no acute distress. Difficult to understand speech but with time she is able to communicate effectively to me. Uses pen and pad as well. Nosebleed active when I entered the room. No further bleeding after some pressure.  Head: Normocephalic, atraumatic.   Eyes: Conjunctiva pink, no icterus. Mouth: Oropharyngeal mucosa moist and pink , no lesions erythema or exudate. Neck: Supple without thyromegaly, masses, or lymphadenopathy.  Lungs: Clear  to auscultation bilaterally.  Heart: Regular rate and rhythm, no murmurs rubs or gallops.  Abdomen: Bowel sounds are normal, nontender, nondistended, no hepatosplenomegaly or masses, no abdominal bruits or    hernia , no rebound or guarding.   Rectal: not performed Extremities: No lower extremity edema, clubbing, deformity.  Neuro: Alert and oriented x 4 , grossly normal neurologically.  Skin: Warm and dry, no rash or jaundice.   Psych: Alert and cooperative, normal mood and affect.        Intake/Output from previous day: 04/04 0701 - 04/05 0700 In: 1701.7 [P.O.:720; I.V.:981.7] Out: 1050 [Urine:1050] Intake/Output this shift:    Lab Results: CBC  Recent Labs  06/23/14 0544 06/24/14 0451 06/25/14 0637  WBC 12.8* 10.3 9.4  HGB 9.9* 8.0* 7.5*  HCT 30.4* 24.0* 23.0*  MCV 96.8 96.8 97.0  PLT 304 229 245   BMET  Recent Labs  06/22/14 2351 06/23/14 0730 06/24/14 0451 06/25/14 0637  NA 142  --  142 141  K 4.2  --  3.8 4.0  CL 104  --  108 110  CO2  --   --  27 25  GLUCOSE 120*  --  98 96  BUN 36*  --  44* 41*  CREATININE 1.50* 1.40* 1.72* 1.42*  CALCIUM  --   --  8.3* 8.1*   LFT No results for input(s): BILITOT, BILIDIR, IBILI, ALKPHOS, AST, ALT, PROT, ALBUMIN in the last 72 hours.  Lipase No results for input(s): LIPASE in the last 72 hours.  PT/INR No results for input(s): LABPROT, INR in the last 72  hours.    Imaging Studies: Ct Head Wo Contrast  06/23/2014   CLINICAL DATA:  Acute onset of generalized weakness. Recent fall. Unresponsive. Initial encounter.  EXAM: CT HEAD WITHOUT CONTRAST  TECHNIQUE: Contiguous axial images were obtained from the base of the skull through the vertex without intravenous contrast.  COMPARISON:  CT of the head performed 10/07/2013, and MRI of the brain performed 10/05/2013  FINDINGS: There is no evidence of acute infarction, mass lesion, or intra- or extra-axial hemorrhage on CT.  Prominence of the ventricles and sulci reflects moderate cortical volume loss. A chronic infarct is noted at the left parietal lobe, with associated encephalomalacia. A smaller chronic infarct is also noted at the right posterior parietal lobe, with associated encephalomalacia. Chronic infarct is noted at the right cerebellar hemisphere. Cerebellar atrophy is noted. Scattered periventricular and subcortical white matter change likely reflects small vessel ischemic microangiopathy.  The brainstem and fourth ventricle are within normal limits. No mass effect or midline shift is seen.  There is no evidence of fracture; visualized osseous structures are unremarkable in appearance. The orbits are within normal limits. There is partial opacification of the right mastoid air cells. The paranasal sinuses and left mastoid air cells are well-aerated. No significant soft tissue abnormalities are seen.  IMPRESSION: 1. No acute intracranial pathology seen on CT. 2. Moderate cortical volume loss and scattered small vessel ischemic microangiopathy. 3. Chronic infarct at the left parietal lobe, with associated encephalomalacia. Smaller chronic infarct at the right posterior parietal lobe, with associated encephalomalacia. Chronic infarct at the right cerebellar hemisphere. 4. Partial opacification of the right mastoid air cells.   Electronically Signed   By: Roanna Raider M.D.   On: 06/23/2014 03:40   Dg  Tibia/fibula Left Port  06/23/2014   ADDENDUM REPORT: 06/23/2014 01:10  ADDENDUM: Two views of the left lower leg are negative for fracture, dislocation or radiopaque foreign  body. No acute soft tissue abnormalities are evident. Moderate arthritic changes are noted about the knee.  IMPRESSION: Negative for acute fracture or acute soft tissue abnormality.   Electronically Signed   By: Ellery Plunk M.D.   On: 06/23/2014 01:10   06/23/2014   CLINICAL DATA:  Pain after falling 1 hr ago while getting out of her wheelchair.  EXAM: PORTABLE LEFT TIBIA AND FIBULA - 2 VIEW  COMPARISON:  None.  Electronically Signed: By: Ellery Plunk M.D. On: 06/23/2014 00:20  [4 week]   Impression: 81 y/o female with history of prior CVA and significant expressive aphasia. She seemed to answer questions appropriately. Complains of intermittent nosebleeds since February. Having one today. States they usually are minor. Since admission she has dropped her hemoglobin from 11.9 to 7.5 over three days. No overt melena, brbpr. She is heme +. Suspect drop in Hgb in part dilutional but still with significant change. Patient on plavix and ASA due to h/o CVA although currently held as of today due to Hgb and active nosebleed.   Heme + stool possibly secondary to ingested blood from nosebleeds. She denies significant/chronic bleeding and none displayed as inpatient to explain drop in Hgb. She may require EGD to rule out source for bleeding. Patient agrees to this.  I have discussed the risks, alternatives, benefits with regards to but not limited to the risk of reaction to medication, bleeding, infection, perforation and the patient is agreeable to proceed. Written consent to be obtained. Cannot exclude possibility of need for colonoscopy in the future but would consider EGD initially.   Plan: -Consider EGD this admission. Currently Plavix and ASA on hold due to active nosebleed and drop in Hgb per attending. Patient is receiving  heparin 5,000 units [place] q8H. Consider stopping and provide ASA instead given h/o CVA, to discuss with Dr. Darrick Penna.  -Add PPI.  We would like to thank you for the opportunity to participate in the care of LIZANDRA ZAKRZEWSKI.  Leanna Battles. Dixon Boos [facility] [phone] 4/5/201610:15 AM     LOS: 1 day     Addendum: Discussed with Dr. Darrick Penna. EGD later today. I will DC Heparin for now, consider resuming anticoagulation based on EGD findings.   Leanna Battles. Dixon Boos [facility] [phone] 4/5/201611:36 AM

## 2014-06-25 NOTE — Procedures (Signed)
  [facility] Carla Fitch A. Gerilyn Pilgrimoonquah, MD     [email or web address]           HISTORY: The patient is a 81 year old female who presents with an episode of altered mental status, confusion and amnesia suspicious for seizure.  MEDICATIONS: Scheduled Meds: . amLODipine  5 mg Oral Daily  . gabapentin  100 mg Oral TID  . gatifloxacin  1 drop Left Eye BID  . losartan  100 mg Oral Daily  . metoprolol tartrate  25 mg Oral BID  . midodrine  2.5 mg Oral BID WC  . pantoprazole  40 mg Oral BID AC  . sodium chloride  3 mL Intravenous Q12H  . timolol  1 drop Right Eye BID   Continuous Infusions: . sodium chloride 50 mL/hr at 06/25/14 0628   PRN Meds:.albuterol  Prior to Admission medications   Medication Sig Start Date End Date Taking? Authorizing Provider  amLODipine (NORVASC) 5 MG tablet Take 5 mg by mouth daily.   Yes Historical Provider, MD  aspirin EC 81 MG tablet Take 81 mg by mouth daily.     Yes Historical Provider, MD  clopidogrel (PLAVIX) 75 MG tablet Take 75 mg by mouth daily.     Yes Historical Provider, MD  furosemide (LASIX) 40 MG tablet Take 40 mg by mouth daily. 01/29/13  Yes Historical Provider, MD  gabapentin (NEURONTIN) 100 MG capsule Take 1 capsule (100 mg total) by mouth 3 (three) times daily. 07/17/13  Yes Vickki HearingStanley E Harrison, MD  losartan (COZAAR) 100 MG tablet Take 100 mg by mouth daily. 01/29/13  Yes Historical Provider, MD  metoprolol tartrate (LOPRESSOR) 25 MG tablet Take 25 mg by mouth 2 (two) times daily.     Yes Historical Provider, MD  moxifloxacin (VIGAMOX) 0.5 % ophthalmic solution Place 1 drop into the left eye 2 (two) times daily.    Yes Historical Provider, MD  PROAIR HFA 108 (90 BASE) MCG/ACT inhaler Inhale 2 puffs into the lungs every 6 (six) hours as needed for wheezing or shortness of breath.  01/04/13  Yes Historical Provider, MD  timolol (TIMOPTIC) 0.25 % ophthalmic solution Place 1 drop into the right eye 2 (two) times daily.    Yes Historical  Provider, MD      ANALYSIS: A 16 channel recording using standard 10 20 measurements is conducted for 21 minutes. There is a well-formed posterior dominant rhythm of 9.5-10 Hz which attenuates with eye opening. There is beta activity observed in the frontal areas. Awake and drowsy activities are observed. Photic stimulation and hyperventilation are not carried out. There are no focal or lateral slowing. There is no epileptiform activity observed.   IMPRESSION: 1. This a normal recording of awake and drowsy states.      Giles Currie A. Gerilyn Bonilla, M.D.  Diplomate, Biomedical engineerAmerican Board of Psychiatry and Neurology ( Neurology).

## 2014-06-25 NOTE — Progress Notes (Signed)
Patient refused to sign consent at this time for an EGD and states that she thinks the bleeding is due to the blood thinners.  MD made aware that patient is refusing at EGD at this time.  Will continue to monitor patient.

## 2014-06-26 LAB — CBC
HCT: 21.6 % — ABNORMAL LOW (ref 36.0–46.0)
Hemoglobin: 7.1 g/dL — ABNORMAL LOW (ref 12.0–15.0)
MCH: 32.3 pg (ref 26.0–34.0)
MCHC: 32.9 g/dL (ref 30.0–36.0)
MCV: 98.2 fL (ref 78.0–100.0)
PLATELETS: 242 10*3/uL (ref 150–400)
RBC: 2.2 MIL/uL — ABNORMAL LOW (ref 3.87–5.11)
RDW: 15 % (ref 11.5–15.5)
WBC: 10.1 10*3/uL (ref 4.0–10.5)

## 2014-06-26 LAB — PREPARE RBC (CROSSMATCH)

## 2014-06-26 MED ORDER — SODIUM CHLORIDE 0.9 % IV SOLN
Freq: Once | INTRAVENOUS | Status: AC
  Administered 2014-06-26: 15:00:00 via INTRAVENOUS

## 2014-06-26 MED ORDER — DIPHENHYDRAMINE HCL 25 MG PO CAPS
25.0000 mg | ORAL_CAPSULE | Freq: Once | ORAL | Status: AC
  Administered 2014-06-26: 25 mg via ORAL
  Filled 2014-06-26: qty 1

## 2014-06-26 MED ORDER — ACETAMINOPHEN 325 MG PO TABS
650.0000 mg | ORAL_TABLET | Freq: Once | ORAL | Status: AC
  Administered 2014-06-26: 650 mg via ORAL
  Filled 2014-06-26: qty 2

## 2014-06-26 MED ORDER — FUROSEMIDE 10 MG/ML IJ SOLN
20.0000 mg | Freq: Once | INTRAMUSCULAR | Status: AC
  Administered 2014-06-26: 20 mg via INTRAVENOUS
  Filled 2014-06-26: qty 2

## 2014-06-26 NOTE — Progress Notes (Signed)
Discussed with Dr. Juanetta GoslingHawkins. Patient still refusing EGD this morning. Plans to transfuse, hold anticoagulation and follow. He will call if patient changes her mind.

## 2014-06-26 NOTE — Progress Notes (Signed)
Subjective: She says she feels okay. She was being scheduled for EGD but has refused. Her hemoglobin level has dropped to 7.1. No other new complaints  Objective: Vital signs in last 24 hours: Temp:  [97.8 F (36.6 C)-98.7 F (37.1 C)] 97.8 F (36.6 C) (04/06 0700) Pulse Rate:  [65-79] 75 (04/06 0700) Resp:  [18-20] 18 (04/06 0700) BP: (100-130)/(37-62) 114/62 mmHg (04/06 0700) SpO2:  [98 %-100 %] 100 % (04/06 0700) Weight change:  Last BM Date: 06/25/14  Intake/Output from previous day: 04/05 0701 - 04/06 0700 In: 1917.5 [P.O.:720; I.V.:1197.5] Out: 1250 [Urine:1250]  PHYSICAL EXAM General appearance: alert, no distress and Difficult to understand her speech Resp: clear to auscultation bilaterally Cardio: regular rate and rhythm, S1, S2 normal, no murmur, click, rub or gallop GI: soft, non-tender; bowel sounds normal; no masses,  no organomegaly Extremities: extremities normal, atraumatic, no cyanosis or edema  Lab Results:  Results for orders placed or performed during the hospital encounter of 06/22/14 (from the past 48 hour(s))  Vitamin B12     Status: None   Collection Time: 06/24/14  9:07 AM  Result Value Ref Range   Vitamin B-12 519 211 - 911 pg/mL    Comment: Performed at [facility]  Folate     Status: None   Collection Time: 06/24/14  9:07 AM  Result Value Ref Range   Folate >20.0 ng/mL    Comment: (NOTE) Reference Ranges        Deficient:       0.4 - 3.3 ng/mL        Indeterminate:   3.4 - 5.4 ng/mL        Normal:              > 5.4 ng/mL Performed at [facility]   Iron and TIBC     Status: None   Collection Time: 06/24/14  9:07 AM  Result Value Ref Range   Iron 65 42 - 145 ug/dL   TIBC 257 250 - 470 ug/dL   Saturation Ratios 25 20 - 55 %   UIBC 192 125 - 400 ug/dL    Comment: Performed at [facility]  Ferritin     Status: None   Collection Time: 06/24/14  9:07 AM  Result Value Ref Range   Ferritin 154 10 - 291 ng/mL   Comment: Performed at [facility]  Reticulocytes     Status: Abnormal   Collection Time: 06/24/14  9:07 AM  Result Value Ref Range   Retic Ct Pct 1.7 0.4 - 3.1 %   RBC. 2.90 (L) 3.87 - 5.11 MIL/uL   Retic Count, Manual 49.3 19.0 - 186.0 K/uL  CBC with Differential/Platelet     Status: Abnormal   Collection Time: 06/25/14  6:37 AM  Result Value Ref Range   WBC 9.4 4.0 - 10.5 K/uL   RBC 2.37 (L) 3.87 - 5.11 MIL/uL   Hemoglobin 7.5 (L) 12.0 - 15.0 g/dL   HCT 23.0 (L) 36.0 - 46.0 %   MCV 97.0 78.0 - 100.0 fL   MCH 31.6 26.0 - 34.0 pg   MCHC 32.6 30.0 - 36.0 g/dL   RDW 15.0 11.5 - 15.5 %   Platelets 245 150 - 400 K/uL   Neutrophils Relative % 49 43 - 77 %   Neutro Abs 4.5 1.7 - 7.7 K/uL   Lymphocytes Relative 37 12 - 46 %   Lymphs Abs 3.5 0.7 - 4.0 K/uL   Monocytes Relative  8 3 - 12 %   Monocytes Absolute 0.8 0.1 - 1.0 K/uL   Eosinophils Relative 5 0 - 5 %   Eosinophils Absolute 0.5 0.0 - 0.7 K/uL   Basophils Relative 1 0 - 1 %   Basophils Absolute 0.1 0.0 - 0.1 K/uL  Basic metabolic panel     Status: Abnormal   Collection Time: 06/25/14  6:37 AM  Result Value Ref Range   Sodium 141 135 - 145 mmol/L   Potassium 4.0 3.5 - 5.1 mmol/L   Chloride 110 96 - 112 mmol/L   CO2 25 19 - 32 mmol/L   Glucose, Bld 96 70 - 99 mg/dL   BUN 41 (H) 6 - 23 mg/dL   Creatinine, Ser 1.42 (H) 0.50 - 1.10 mg/dL   Calcium 8.1 (L) 8.4 - 10.5 mg/dL   GFR calc non Af Amer 31 (L) >90 mL/min   GFR calc Af Amer 36 (L) >90 mL/min    Comment: (NOTE) The eGFR has been calculated using the CKD EPI equation. This calculation has not been validated in all clinical situations. eGFR's persistently <90 mL/min signify possible Chronic Kidney Disease.    Anion gap 6 5 - 15  Vitamin B12     Status: None   Collection Time: 06/25/14  6:37 AM  Result Value Ref Range   Vitamin B-12 696 211 - 911 pg/mL    Comment: Performed at [facility]  Hemoglobin and hematocrit, blood     Status: Abnormal    Collection Time: 06/25/14  3:51 PM  Result Value Ref Range   Hemoglobin 7.9 (L) 12.0 - 15.0 g/dL   HCT 25.0 (L) 36.0 - 46.0 %  CBC     Status: Abnormal   Collection Time: 06/26/14  5:54 AM  Result Value Ref Range   WBC 10.1 4.0 - 10.5 K/uL   RBC 2.20 (L) 3.87 - 5.11 MIL/uL   Hemoglobin 7.1 (L) 12.0 - 15.0 g/dL   HCT 21.6 (L) 36.0 - 46.0 %   MCV 98.2 78.0 - 100.0 fL   MCH 32.3 26.0 - 34.0 pg   MCHC 32.9 30.0 - 36.0 g/dL   RDW 15.0 11.5 - 15.5 %   Platelets 242 150 - 400 K/uL    ABGS No results for input(s): PHART, PO2ART, TCO2, HCO3 in the last 72 hours.  Invalid input(s): PCO2 CULTURES No results found for this or any previous visit (from the past 240 hour(s)). Studies/Results: No results found.  Medications:  Prior to Admission:  Prescriptions prior to admission  Medication Sig Dispense Refill Last Dose  . amLODipine (NORVASC) 5 MG tablet Take 5 mg by mouth daily.   Past Month at Unknown time  . aspirin EC 81 MG tablet Take 81 mg by mouth daily.     06/22/2014 at Unknown time  . clopidogrel (PLAVIX) 75 MG tablet Take 75 mg by mouth daily.     06/22/2014 at Unknown time  . furosemide (LASIX) 40 MG tablet Take 40 mg by mouth daily.   06/22/2014 at Unknown time  . gabapentin (NEURONTIN) 100 MG capsule Take 1 capsule (100 mg total) by mouth 3 (three) times daily. 90 capsule 5 06/22/2014 at Unknown time  . losartan (COZAAR) 100 MG tablet Take 100 mg by mouth daily.   06/22/2014 at Unknown time  . metoprolol tartrate (LOPRESSOR) 25 MG tablet Take 25 mg by mouth 2 (two) times daily.     06/22/2014 at 1900  . moxifloxacin (VIGAMOX) 0.5 % ophthalmic  solution Place 1 drop into the left eye 2 (two) times daily.    06/22/2014 at Unknown time  . PROAIR HFA 108 (90 BASE) MCG/ACT inhaler Inhale 2 puffs into the lungs every 6 (six) hours as needed for wheezing or shortness of breath.    06/22/2014 at Unknown time  . timolol (TIMOPTIC) 0.25 % ophthalmic solution Place 1 drop into the right eye 2 (two) times  daily.    06/22/2014 at Unknown time   Scheduled: . amLODipine  5 mg Oral Daily  . gabapentin  100 mg Oral TID  . gatifloxacin  1 drop Left Eye BID  . losartan  100 mg Oral Daily  . metoprolol tartrate  25 mg Oral BID  . midodrine  2.5 mg Oral BID WC  . pantoprazole  40 mg Oral BID AC  . sodium chloride  3 mL Intravenous Q12H  . timolol  1 drop Right Eye BID   Continuous: . sodium chloride 50 mL/hr (06/26/14 0423)   EXO:[id number]  Assesment: She has GI bleeding. GI consultation is noted and appreciated but she has refused EGD. She is off anticoagulation now. She is not able to do any DVT prophylaxis because she has problems with her leg and can't wear SCDs and is bleeding and can't take any sort of anticoagulation.  She has acute blood loss anemia and I'm going to go ahead and transfuse her.  She had a previous stroke.  She had a syncopal episode and is not quite clear what that is from. Principal Problem:   Syncope and collapse Active Problems:   H/O: stroke with residual effects   Spinal stenosis of lumbar region with radiculopathy   Syncope   Laceration   Heme positive stool   Acute blood loss anemia    Plan: Transfuse 2 units packed red blood cells.    LOS: 2 days   Li Bobo L 06/26/2014, 8:19 AM

## 2014-06-26 NOTE — Progress Notes (Signed)
Patient ID: Carla Bonilla, female   DOB: Feb 07, 1921, 81 y.o.   MRN: [id number]  Carla Seco A. Merlene Laughter, MD     [email or web address]          Carla Bonilla is an 81 y.o. female.   Assessment/Plan: 1. Episode of loss of consciousness/syncope associated with amnesia. Given the patient's previous large vessel cortical infarct, there is an increased risk of post stroke seizures. 2. Brief episode of acute confusion this afternoon possibly related to awakening from sleep. 3. Baseline marked global aphasia due to left parietal infarct in 2009. 4. Left eye blindness. 5. Hypertension. 6. Fall/gait impairment. 7. Recurrent nosebleeds on dual antiplatelet agents with positive guaiac stool.  RECOMMENDATION: We strongly recommend that the patient be taken no dual antiplatelet agents. A single agent is recommended once her EGD is completed. Plavix is my recommendation. No need for anticoagulation from my standpoint. Compression stockings can be used for DVT prophylaxis. Given the normal EEG, seizure medications are not recommended at this time. I will signed off for now. Consults as needed.   The patient has had a drop in her hemoglobin. GI has seen her. She is on dual antiplatelet agents. Apparently, the patient is refusing to have EGD testing done. No complaints reported.   GENERAL: No complaints.   HEENT: Neck is supple. Head is normocephalic atraumatic.  ABDOMEN: soft  EXTREMITIES: No edema   BACK:Unremarkable  SKIN: Normal by inspection.   MENTAL STATUS: Alert. Much more responsive today. She has a moderately severe global aphasia. Fluency is decent but still incomprehensible. Comprehension is better but impaired. She does follow commands and knows that she in the hospital.  CRANIAL NERVES: The right pupil is about 7 mm and sluggishly reactive. It is somewhat spherically shaped. The left is 6 mm also sluggishly reactive. Extraocular movements are intact.There  is no significant nystagmus; visual fields are appears to be full; upper and lower facial muscles are normal in strength and symmetric, there is no flattening of the nasolabial folds; tongue is midline.  MOTOR: The examination is limited due to impaired comprehension and a fascia but she does not appear to have focal weakness. Strength is at least 4/5 in upper and lower extremities. Bulk and tone are normal.  COORDINATION: Left finger to nose is normal, right finger to nose is normal, No rest tremor; no intention tremor; no postural tremor; no bradykinesia.      Objective: Vital signs in last 24 hours: Temp:  [97.8 F (36.6 C)-99.2 F (37.3 C)] 98.4 F (36.9 C) (04/06 2145) Pulse Rate:  [67-78] 73 (04/06 2145) Resp:  [18-20] 20 (04/06 2145) BP: (99-120)/(35-88) 120/50 mmHg (04/06 2145) SpO2:  [98 %-100 %] 100 % (04/06 2145)  Intake/Output from previous day: 04/05 0701 - 04/06 0700 In: 1917.5 [P.O.:720; I.V.:1197.5] Out: 1250 [Urine:1250] Intake/Output this shift:   Nutritional status: DIET DYS 3 Room service appropriate?: Yes; Fluid consistency:: Thin   Lab Results: Results for orders placed or performed during the hospital encounter of 06/22/14 (from the past 48 hour(s))  CBC with Differential/Platelet     Status: Abnormal   Collection Time: 06/25/14  6:37 AM  Result Value Ref Range   WBC 9.4 4.0 - 10.5 K/uL   RBC 2.37 (L) 3.87 - 5.11 MIL/uL   Hemoglobin 7.5 (L) 12.0 - 15.0 g/dL   HCT 23.0 (L) 36.0 - 46.0 %   MCV 97.0 78.0 - 100.0 fL   MCH 31.6 26.0 - 34.0 pg   MCHC 32.6  30.0 - 36.0 g/dL   RDW 15.0 11.5 - 15.5 %   Platelets 245 150 - 400 K/uL   Neutrophils Relative % 49 43 - 77 %   Neutro Abs 4.5 1.7 - 7.7 K/uL   Lymphocytes Relative 37 12 - 46 %   Lymphs Abs 3.5 0.7 - 4.0 K/uL   Monocytes Relative 8 3 - 12 %   Monocytes Absolute 0.8 0.1 - 1.0 K/uL   Eosinophils Relative 5 0 - 5 %   Eosinophils Absolute 0.5 0.0 - 0.7 K/uL   Basophils Relative 1 0 - 1 %    Basophils Absolute 0.1 0.0 - 0.1 K/uL  Basic metabolic panel     Status: Abnormal   Collection Time: 06/25/14  6:37 AM  Result Value Ref Range   Sodium 141 135 - 145 mmol/L   Potassium 4.0 3.5 - 5.1 mmol/L   Chloride 110 96 - 112 mmol/L   CO2 25 19 - 32 mmol/L   Glucose, Bld 96 70 - 99 mg/dL   BUN 41 (H) 6 - 23 mg/dL   Creatinine, Ser 1.42 (H) 0.50 - 1.10 mg/dL   Calcium 8.1 (L) 8.4 - 10.5 mg/dL   GFR calc non Af Amer 31 (L) >90 mL/min   GFR calc Af Amer 36 (L) >90 mL/min    Comment: (NOTE) The eGFR has been calculated using the CKD EPI equation. This calculation has not been validated in all clinical situations. eGFR's persistently <90 mL/min signify possible Chronic Kidney Disease.    Anion gap 6 5 - 15  Vitamin B12     Status: None   Collection Time: 06/25/14  6:37 AM  Result Value Ref Range   Vitamin B-12 696 211 - 911 pg/mL    Comment: Performed at [facility]  Hemoglobin and hematocrit, blood     Status: Abnormal   Collection Time: 06/25/14  3:51 PM  Result Value Ref Range   Hemoglobin 7.9 (L) 12.0 - 15.0 g/dL   HCT 25.0 (L) 36.0 - 46.0 %  CBC     Status: Abnormal   Collection Time: 06/26/14  5:54 AM  Result Value Ref Range   WBC 10.1 4.0 - 10.5 K/uL   RBC 2.20 (L) 3.87 - 5.11 MIL/uL   Hemoglobin 7.1 (L) 12.0 - 15.0 g/dL   HCT 21.6 (L) 36.0 - 46.0 %   MCV 98.2 78.0 - 100.0 fL   MCH 32.3 26.0 - 34.0 pg   MCHC 32.9 30.0 - 36.0 g/dL   RDW 15.0 11.5 - 15.5 %   Platelets 242 150 - 400 K/uL  Type and screen     Status: None (Preliminary result)   Collection Time: 06/26/14  8:57 AM  Result Value Ref Range   ABO/RH(D) B POS    Antibody Screen POS    Sample Expiration 06/29/2014    DAT, IgG NEG    PT AG Type NEGATIVE FOR M ANTIGEN    Antibody Identification ANTI M    Unit Number [id number]    Blood Component Type RED CELLS,LR    Unit division 00    Status of Unit ALLOCATED    Donor AG Type NEGATIVE FOR M ANTIGEN    Transfusion Status OK TO TRANSFUSE      Crossmatch Result COMPATIBLE    Unit Number [id number]    Blood Component Type RED CELLS,LR    Unit division 00    Status of Unit ISSUED    Donor AG Type NEGATIVE FOR M  ANTIGEN    Transfusion Status OK TO TRANSFUSE    Crossmatch Result COMPATIBLE   Prepare RBC     Status: None   Collection Time: 06/26/14  8:57 AM  Result Value Ref Range   Order Confirmation ORDER PROCESSED BY BLOOD BANK     Lipid Panel No results for input(s): CHOL, TRIG, HDL, CHOLHDL, VLDL, LDLCALC in the last 72 hours.  Studies/Results:   Medications:  Scheduled Meds: . amLODipine  5 mg Oral Daily  . gabapentin  100 mg Oral TID  . gatifloxacin  1 drop Left Eye BID  . losartan  100 mg Oral Daily  . metoprolol tartrate  25 mg Oral BID  . midodrine  2.5 mg Oral BID WC  . pantoprazole  40 mg Oral BID AC  . sodium chloride  3 mL Intravenous Q12H  . timolol  1 drop Right Eye BID   Continuous Infusions: . sodium chloride 50 mL/hr (06/26/14 0423)   PRN Meds:.albuterol     LOS: 2 days   Jasiah Elsen A. Merlene Bonilla, M.D.  Diplomate, Tax adviser of Psychiatry and Neurology ( Neurology).

## 2014-06-27 DIAGNOSIS — K922 Gastrointestinal hemorrhage, unspecified: Secondary | ICD-10-CM | POA: Diagnosis present

## 2014-06-27 LAB — TYPE AND SCREEN
ABO/RH(D): B POS
ANTIBODY SCREEN: POSITIVE
DAT, IgG: NEGATIVE
DONOR AG TYPE: NEGATIVE
Donor AG Type: NEGATIVE
PT AG Type: NEGATIVE
UNIT DIVISION: 0
Unit division: 0

## 2014-06-27 LAB — CBC WITH DIFFERENTIAL/PLATELET
BASOS PCT: 1 % (ref 0–1)
Basophils Absolute: 0.1 10*3/uL (ref 0.0–0.1)
EOS PCT: 3 % (ref 0–5)
Eosinophils Absolute: 0.4 10*3/uL (ref 0.0–0.7)
HCT: 30.6 % — ABNORMAL LOW (ref 36.0–46.0)
Hemoglobin: 10 g/dL — ABNORMAL LOW (ref 12.0–15.0)
LYMPHS PCT: 19 % (ref 12–46)
Lymphs Abs: 2.1 10*3/uL (ref 0.7–4.0)
MCH: 29.2 pg (ref 26.0–34.0)
MCHC: 32.7 g/dL (ref 30.0–36.0)
MCV: 89.2 fL (ref 78.0–100.0)
Monocytes Absolute: 1.2 10*3/uL — ABNORMAL HIGH (ref 0.1–1.0)
Monocytes Relative: 11 % (ref 3–12)
Neutro Abs: 7.3 10*3/uL (ref 1.7–7.7)
Neutrophils Relative %: 67 % (ref 43–77)
PLATELETS: 230 10*3/uL (ref 150–400)
RBC: 3.43 MIL/uL — ABNORMAL LOW (ref 3.87–5.11)
RDW: 21.8 % — AB (ref 11.5–15.5)
WBC: 11 10*3/uL — ABNORMAL HIGH (ref 4.0–10.5)

## 2014-06-27 MED ORDER — MIDODRINE HCL 2.5 MG PO TABS: 2.5000 mg | ORAL_TABLET | Freq: Two times a day (BID) | ORAL | Status: DC

## 2014-06-27 MED ORDER — FERROUS SULFATE 325 (65 FE) MG PO TABS: 325.0000 mg | ORAL_TABLET | Freq: Every day | ORAL | Status: DC

## 2014-06-27 NOTE — Progress Notes (Signed)
Subjective: She says she feels well and wants to go home. She has no new complaints. The help from consultants as noted and appreciated. She has refused EGD.  Objective: Vital signs in last 24 hours: Temp:  [97.8 F (36.6 C)-99.2 F (37.3 C)] 98.9 F (37.2 C) (04/07 0424) Pulse Rate:  [67-78] 75 (04/07 0424) Resp:  [18-20] 18 (04/07 0130) BP: (99-125)/(35-88) 119/35 mmHg (04/07 0130) SpO2:  [95 %-100 %] 97 % (04/07 0424) Weight change:  Last BM Date: 06/25/14  Intake/Output from previous day: 04/06 0701 - 04/07 0700 In: 1611.3 [P.O.:360; I.V.:581.3; Blood:670] Out: 1000 [Urine:1000]  PHYSICAL EXAM General appearance: alert, cooperative, no distress and Severe speech abnormality Resp: clear to auscultation bilaterally Cardio: regular rate and rhythm, S1, S2 normal, no murmur, click, rub or gallop GI: soft, non-tender; bowel sounds normal; no masses,  no organomegaly Extremities: extremities normal, atraumatic, no cyanosis or edema  Lab Results:  Results for orders placed or performed during the hospital encounter of 06/22/14 (from the past 48 hour(s))  Hemoglobin and hematocrit, blood     Status: Abnormal   Collection Time: 06/25/14  3:51 PM  Result Value Ref Range   Hemoglobin 7.9 (L) 12.0 - 15.0 g/dL   HCT 16.1 (L) 09.6 - 04.5 %  CBC     Status: Abnormal   Collection Time: 06/26/14  5:54 AM  Result Value Ref Range   WBC 10.1 4.0 - 10.5 K/uL   RBC 2.20 (L) 3.87 - 5.11 MIL/uL   Hemoglobin 7.1 (L) 12.0 - 15.0 g/dL   HCT 40.9 (L) 81.1 - 91.4 %   MCV 98.2 78.0 - 100.0 fL   MCH 32.3 26.0 - 34.0 pg   MCHC 32.9 30.0 - 36.0 g/dL   RDW 78.2 95.6 - 21.3 %   Platelets 242 150 - 400 K/uL  Type and screen     Status: None (Preliminary result)   Collection Time: 06/26/14  8:57 AM  Result Value Ref Range   ABO/RH(D) B POS    Antibody Screen POS    Sample Expiration 06/29/2014    DAT, IgG NEG    PT AG Type NEGATIVE FOR M ANTIGEN    Antibody Identification ANTI M    Unit  Number [id number]    Blood Component Type RED CELLS,LR    Unit division 00    Status of Unit ISSUED    Donor AG Type NEGATIVE FOR M ANTIGEN    Transfusion Status OK TO TRANSFUSE    Crossmatch Result COMPATIBLE    Unit Number [id number]    Blood Component Type RED CELLS,LR    Unit division 00    Status of Unit ISSUED    Donor AG Type NEGATIVE FOR M ANTIGEN    Transfusion Status OK TO TRANSFUSE    Crossmatch Result COMPATIBLE   Prepare RBC     Status: None   Collection Time: 06/26/14  8:57 AM  Result Value Ref Range   Order Confirmation ORDER PROCESSED BY BLOOD BANK   CBC with Differential/Platelet     Status: Abnormal   Collection Time: 06/27/14  5:54 AM  Result Value Ref Range   WBC 11.0 (H) 4.0 - 10.5 K/uL   RBC 3.43 (L) 3.87 - 5.11 MIL/uL   Hemoglobin 10.0 (L) 12.0 - 15.0 g/dL    Comment: DELTA CHECK NOTED   HCT 30.6 (L) 36.0 - 46.0 %   MCV 89.2 78.0 - 100.0 fL    Comment: DELTA CHECK NOTED   MCH 29.2  26.0 - 34.0 pg   MCHC 32.7 30.0 - 36.0 g/dL   RDW 40.9 (H) 81.1 - 91.4 %   Platelets 230 150 - 400 K/uL   Neutrophils Relative % 67 43 - 77 %   Neutro Abs 7.3 1.7 - 7.7 K/uL   Lymphocytes Relative 19 12 - 46 %   Lymphs Abs 2.1 0.7 - 4.0 K/uL   Monocytes Relative 11 3 - 12 %   Monocytes Absolute 1.2 (H) 0.1 - 1.0 K/uL   Eosinophils Relative 3 0 - 5 %   Eosinophils Absolute 0.4 0.0 - 0.7 K/uL   Basophils Relative 1 0 - 1 %   Basophils Absolute 0.1 0.0 - 0.1 K/uL    ABGS No results for input(s): PHART, PO2ART, TCO2, HCO3 in the last 72 hours.  Invalid input(s): PCO2 CULTURES No results found for this or any previous visit (from the past 240 hour(s)). Studies/Results: No results found.  Medications:  Prior to Admission:  Prescriptions prior to admission  Medication Sig Dispense Refill Last Dose  . amLODipine (NORVASC) 5 MG tablet Take 5 mg by mouth daily.   Past Month at Unknown time  . aspirin EC 81 MG tablet Take 81 mg by mouth daily.     06/22/2014 at  Unknown time  . clopidogrel (PLAVIX) 75 MG tablet Take 75 mg by mouth daily.     06/22/2014 at Unknown time  . furosemide (LASIX) 40 MG tablet Take 40 mg by mouth daily.   06/22/2014 at Unknown time  . gabapentin (NEURONTIN) 100 MG capsule Take 1 capsule (100 mg total) by mouth 3 (three) times daily. 90 capsule 5 06/22/2014 at Unknown time  . losartan (COZAAR) 100 MG tablet Take 100 mg by mouth daily.   06/22/2014 at Unknown time  . metoprolol tartrate (LOPRESSOR) 25 MG tablet Take 25 mg by mouth 2 (two) times daily.     06/22/2014 at 1900  . moxifloxacin (VIGAMOX) 0.5 % ophthalmic solution Place 1 drop into the left eye 2 (two) times daily.    06/22/2014 at Unknown time  . PROAIR HFA 108 (90 BASE) MCG/ACT inhaler Inhale 2 puffs into the lungs every 6 (six) hours as needed for wheezing or shortness of breath.    06/22/2014 at Unknown time  . timolol (TIMOPTIC) 0.25 % ophthalmic solution Place 1 drop into the right eye 2 (two) times daily.    06/22/2014 at Unknown time   Scheduled: . amLODipine  5 mg Oral Daily  . gabapentin  100 mg Oral TID  . gatifloxacin  1 drop Left Eye BID  . losartan  100 mg Oral Daily  . metoprolol tartrate  25 mg Oral BID  . midodrine  2.5 mg Oral BID WC  . pantoprazole  40 mg Oral BID AC  . sodium chloride  3 mL Intravenous Q12H  . timolol  1 drop Right Eye BID   Continuous: . sodium chloride 50 mL/hr at 06/27/14 0651   NWG:[id number]  Assesment: She was admitted with syncope and collapse. She had acute blood loss anemia with heme positive stool. She has GI bleeding. She refused EGD so we don't have a source of the GI bleeding. She says she wants to go home. Principal Problem:   Syncope and collapse Active Problems:   H/O: stroke with residual effects   Spinal stenosis of lumbar region with radiculopathy   Syncope   Laceration   Heme positive stool   Acute blood loss anemia    Plan: Continue  current treatments. She will remain off any anticoagulants for now. She will be  sent home with home health services.    LOS: 3 days   Rafan Sanders L 06/27/2014, 8:34 AM

## 2014-06-27 NOTE — Progress Notes (Signed)
Patient was given discharge instructions,on medications,and follow up visits,caretaker present at bedside,verbalized understanding. No c/o pain or discomfort noted. Vital signs stable. Accompanied by staff to an awaiting vehicle.

## 2014-06-27 NOTE — Discharge Summary (Signed)
Physician Discharge Summary  Patient ID: EVOLET SALMINEN MRN: [id number] DOB/AGE: 1920-04-08 81 y.o. Primary Care Physician:Ladamien Rammel L, MD Admit date: 06/22/2014 Discharge date: 06/27/2014    Discharge Diagnoses:   Principal Problem:   Syncope and collapse Active Problems:   H/O: stroke with residual effects   Spinal stenosis of lumbar region with radiculopathy   Syncope   Laceration   Heme positive stool   Acute blood loss anemia   GI bleeding     Medication List    STOP taking these medications        aspirin EC 81 MG tablet     clopidogrel 75 MG tablet  Commonly known as:  PLAVIX      TAKE these medications        amLODipine 5 MG tablet  Commonly known as:  NORVASC  Take 5 mg by mouth daily.     furosemide 40 MG tablet  Commonly known as:  LASIX  Take 40 mg by mouth daily.     gabapentin 100 MG capsule  Commonly known as:  NEURONTIN  Take 1 capsule (100 mg total) by mouth 3 (three) times daily.     losartan 100 MG tablet  Commonly known as:  COZAAR  Take 100 mg by mouth daily.     metoprolol tartrate 25 MG tablet  Commonly known as:  LOPRESSOR  Take 25 mg by mouth 2 (two) times daily.     midodrine 2.5 MG tablet  Commonly known as:  PROAMATINE  Take 1 tablet (2.5 mg total) by mouth 2 (two) times daily with a meal.     moxifloxacin 0.5 % ophthalmic solution  Commonly known as:  VIGAMOX  Place 1 drop into the left eye 2 (two) times daily.     PROAIR HFA 108 (90 BASE) MCG/ACT inhaler  Generic drug:  albuterol  Inhale 2 puffs into the lungs every 6 (six) hours as needed for wheezing or shortness of breath.     timolol 0.25 % ophthalmic solution  Commonly known as:  TIMOPTIC  Place 1 drop into the right eye 2 (two) times daily.        Discharged Condition: Improved    Consults: Neurology/gastroenterology  Significant Diagnostic Studies: Ct Head Wo Contrast  06/23/2014   CLINICAL DATA:  Acute onset of generalized weakness. Recent  fall. Unresponsive. Initial encounter.  EXAM: CT HEAD WITHOUT CONTRAST  TECHNIQUE: Contiguous axial images were obtained from the base of the skull through the vertex without intravenous contrast.  COMPARISON:  CT of the head performed 10/07/2013, and MRI of the brain performed 10/05/2013  FINDINGS: There is no evidence of acute infarction, mass lesion, or intra- or extra-axial hemorrhage on CT.  Prominence of the ventricles and sulci reflects moderate cortical volume loss. A chronic infarct is noted at the left parietal lobe, with associated encephalomalacia. A smaller chronic infarct is also noted at the right posterior parietal lobe, with associated encephalomalacia. Chronic infarct is noted at the right cerebellar hemisphere. Cerebellar atrophy is noted. Scattered periventricular and subcortical white matter change likely reflects small vessel ischemic microangiopathy.  The brainstem and fourth ventricle are within normal limits. No mass effect or midline shift is seen.  There is no evidence of fracture; visualized osseous structures are unremarkable in appearance. The orbits are within normal limits. There is partial opacification of the right mastoid air cells. The paranasal sinuses and left mastoid air cells are well-aerated. No significant soft tissue abnormalities are seen.  IMPRESSION: 1. No acute  intracranial pathology seen on CT. 2. Moderate cortical volume loss and scattered small vessel ischemic microangiopathy. 3. Chronic infarct at the left parietal lobe, with associated encephalomalacia. Smaller chronic infarct at the right posterior parietal lobe, with associated encephalomalacia. Chronic infarct at the right cerebellar hemisphere. 4. Partial opacification of the right mastoid air cells.   Electronically Signed   By: Roanna RaiderJeffery  Chang M.D.   On: 06/23/2014 03:40   Dg Tibia/fibula Left Port  06/23/2014   ADDENDUM REPORT: 06/23/2014 01:10  ADDENDUM: Two views of the left lower leg are negative for  fracture, dislocation or radiopaque foreign body. No acute soft tissue abnormalities are evident. Moderate arthritic changes are noted about the knee.  IMPRESSION: Negative for acute fracture or acute soft tissue abnormality.   Electronically Signed   By: Ellery Plunkaniel R Mitchell M.D.   On: 06/23/2014 01:10   06/23/2014   CLINICAL DATA:  Pain after falling 1 hr ago while getting out of her wheelchair.  EXAM: PORTABLE LEFT TIBIA AND FIBULA - 2 VIEW  COMPARISON:  None.  Electronically Signed: By: Ellery Plunkaniel R Mitchell M.D. On: 06/23/2014 00:20    Lab Results: Basic Metabolic Panel:  Recent Labs  16/12/9602/05/16 0637  NA 141  K 4.0  CL 110  CO2 25  GLUCOSE 96  BUN 41*  CREATININE 1.42*  CALCIUM 8.1*   Liver Function Tests: No results for input(s): AST, ALT, ALKPHOS, BILITOT, PROT, ALBUMIN in the last 72 hours.   CBC:  Recent Labs  06/25/14 0637  06/26/14 0554 06/27/14 0554  WBC 9.4  --  10.1 11.0*  NEUTROABS 4.5  --   --  7.3  HGB 7.5*  < > 7.1* 10.0*  HCT 23.0*  < > 21.6* 30.6*  MCV 97.0  --  98.2 89.2  PLT 245  --  242 230  < > = values in this interval not displayed.  No results found for this or any previous visit (from the past 240 hour(s)).   Hospital Course: This is a 81 year old who fell at home. This was a mechanical fall and she had a laceration on her leg. She was evaluated in the emergency department and was being discharged when she passed out in the parking lot. She completely lost consciousness. She was brought back into the ER and admitted from there. She was noted to have dehydration some mild acute renal failure. The next morning her hemoglobin level had dropped and she was found to have orthostatic hypotension and found to have heme positive stool. Her hemoglobin dropped further and she received 2 units of blood. She had GI consultation and plan was for her to have EGD but she refused. She was discontinued from anticoagulation. She had consultation with neurology and it was  thought that she might have seizures but her EEG was okay. At the time of discharge her hemoglobin level is 10 after 2 units of blood she's not noted any further bleeding and she remains off anticoagulation. She is at baseline now.  Discharge Exam: Blood pressure 119/35, pulse 75, temperature 98.9 F (37.2 C), temperature source Oral, resp. rate 18, height 5' (1.524 m), weight 63.1 kg (139 lb 1.8 oz), SpO2 97 %. She is awake and alert. She has severe speech abnormalities from previous stroke. Her chest is clear  Disposition: Home with home health services. She will continue treatments and have hemoglobin levels checked. She will remain off all anticoagulation.      Discharge Instructions    Face-to-face encounter (required for Medicare/Medicaid  patients)    Complete by:  As directed   I Teeghan Hammer L certify that this patient is under my care and that I, or a nurse practitioner or physician's assistant working with me, had a face-to-face encounter that meets the physician face-to-face encounter requirements with this patient on 06/27/2014. The encounter with the patient was in whole, or in part for the following medical condition(s) which is the primary reason for home health care (List medical condition): syncope/gi bleed  The encounter with the patient was in whole, or in part, for the following medical condition, which is the primary reason for home health care:  syncope/gi bleed  I certify that, based on my findings, the following services are medically necessary home health services:   Nursing Physical therapy    Reason for Medically Necessary Home Health Services:  Skilled Nursing- Change/Decline in Patient Status  My clinical findings support the need for the above services:  Unable to leave home safely without assistance and/or assistive device  Further, I certify that my clinical findings support that this patient is homebound due to:  Unable to leave home safely without assistance      Home Health    Complete by:  As directed   To provide the following care/treatments:   PT RN    Cbc on 4/9 2016 and on 07/04/2014           Follow-up Information    Follow up with Ferlando Lia L, MD In 4 days.   Specialty:  Pulmonary Disease   Contact information:   [address] [place] [address] [phone]       Signed: Jamar Casagrande L   06/27/2014, 8:42 AM

## 2014-06-27 NOTE — Progress Notes (Signed)
UR chart review completed.  

## 2014-06-29 DIAGNOSIS — R55 Syncope and collapse: Secondary | ICD-10-CM | POA: Diagnosis not present

## 2014-06-29 DIAGNOSIS — K922 Gastrointestinal hemorrhage, unspecified: Secondary | ICD-10-CM | POA: Diagnosis not present

## 2014-06-29 DIAGNOSIS — M5416 Radiculopathy, lumbar region: Secondary | ICD-10-CM | POA: Diagnosis not present

## 2014-06-29 DIAGNOSIS — M5418 Radiculopathy, sacral and sacrococcygeal region: Secondary | ICD-10-CM | POA: Diagnosis not present

## 2014-06-29 DIAGNOSIS — I69320 Aphasia following cerebral infarction: Secondary | ICD-10-CM | POA: Diagnosis not present

## 2014-06-29 DIAGNOSIS — D62 Acute posthemorrhagic anemia: Secondary | ICD-10-CM | POA: Diagnosis not present

## 2014-06-29 DIAGNOSIS — I10 Essential (primary) hypertension: Secondary | ICD-10-CM | POA: Diagnosis not present

## 2014-06-29 DIAGNOSIS — S81802A Unspecified open wound, left lower leg, initial encounter: Secondary | ICD-10-CM | POA: Diagnosis not present

## 2014-06-29 DIAGNOSIS — M4806 Spinal stenosis, lumbar region: Secondary | ICD-10-CM | POA: Diagnosis not present

## 2014-06-29 DIAGNOSIS — Z9181 History of falling: Secondary | ICD-10-CM | POA: Diagnosis not present

## 2014-06-29 DIAGNOSIS — Z5181 Encounter for therapeutic drug level monitoring: Secondary | ICD-10-CM | POA: Diagnosis not present

## 2014-07-03 DIAGNOSIS — S81802A Unspecified open wound, left lower leg, initial encounter: Secondary | ICD-10-CM | POA: Diagnosis not present

## 2014-07-03 DIAGNOSIS — I69320 Aphasia following cerebral infarction: Secondary | ICD-10-CM | POA: Diagnosis not present

## 2014-07-03 DIAGNOSIS — D62 Acute posthemorrhagic anemia: Secondary | ICD-10-CM | POA: Diagnosis not present

## 2014-07-03 DIAGNOSIS — K922 Gastrointestinal hemorrhage, unspecified: Secondary | ICD-10-CM | POA: Diagnosis not present

## 2014-07-03 DIAGNOSIS — M4806 Spinal stenosis, lumbar region: Secondary | ICD-10-CM | POA: Diagnosis not present

## 2014-07-03 DIAGNOSIS — M5416 Radiculopathy, lumbar region: Secondary | ICD-10-CM | POA: Diagnosis not present

## 2014-07-04 DIAGNOSIS — I6350 Cerebral infarction due to unspecified occlusion or stenosis of unspecified cerebral artery: Secondary | ICD-10-CM | POA: Diagnosis not present

## 2014-07-04 DIAGNOSIS — S81812D Laceration without foreign body, left lower leg, subsequent encounter: Secondary | ICD-10-CM | POA: Diagnosis not present

## 2014-07-04 DIAGNOSIS — M4806 Spinal stenosis, lumbar region: Secondary | ICD-10-CM | POA: Diagnosis not present

## 2014-07-04 DIAGNOSIS — S81802A Unspecified open wound, left lower leg, initial encounter: Secondary | ICD-10-CM | POA: Diagnosis not present

## 2014-07-04 DIAGNOSIS — I69320 Aphasia following cerebral infarction: Secondary | ICD-10-CM | POA: Diagnosis not present

## 2014-07-04 DIAGNOSIS — M5416 Radiculopathy, lumbar region: Secondary | ICD-10-CM | POA: Diagnosis not present

## 2014-07-04 DIAGNOSIS — I10 Essential (primary) hypertension: Secondary | ICD-10-CM | POA: Diagnosis not present

## 2014-07-04 DIAGNOSIS — K922 Gastrointestinal hemorrhage, unspecified: Secondary | ICD-10-CM | POA: Diagnosis not present

## 2014-07-04 DIAGNOSIS — D62 Acute posthemorrhagic anemia: Secondary | ICD-10-CM | POA: Diagnosis not present

## 2014-07-05 DIAGNOSIS — K922 Gastrointestinal hemorrhage, unspecified: Secondary | ICD-10-CM | POA: Diagnosis not present

## 2014-07-05 DIAGNOSIS — D62 Acute posthemorrhagic anemia: Secondary | ICD-10-CM | POA: Diagnosis not present

## 2014-07-05 DIAGNOSIS — M5416 Radiculopathy, lumbar region: Secondary | ICD-10-CM | POA: Diagnosis not present

## 2014-07-05 DIAGNOSIS — S81802A Unspecified open wound, left lower leg, initial encounter: Secondary | ICD-10-CM | POA: Diagnosis not present

## 2014-07-05 DIAGNOSIS — M4806 Spinal stenosis, lumbar region: Secondary | ICD-10-CM | POA: Diagnosis not present

## 2014-07-05 DIAGNOSIS — I69320 Aphasia following cerebral infarction: Secondary | ICD-10-CM | POA: Diagnosis not present

## 2014-07-08 DIAGNOSIS — S81802A Unspecified open wound, left lower leg, initial encounter: Secondary | ICD-10-CM | POA: Diagnosis not present

## 2014-07-08 DIAGNOSIS — K922 Gastrointestinal hemorrhage, unspecified: Secondary | ICD-10-CM | POA: Diagnosis not present

## 2014-07-08 DIAGNOSIS — D62 Acute posthemorrhagic anemia: Secondary | ICD-10-CM | POA: Diagnosis not present

## 2014-07-08 DIAGNOSIS — I69320 Aphasia following cerebral infarction: Secondary | ICD-10-CM | POA: Diagnosis not present

## 2014-07-08 DIAGNOSIS — M4806 Spinal stenosis, lumbar region: Secondary | ICD-10-CM | POA: Diagnosis not present

## 2014-07-08 DIAGNOSIS — M5416 Radiculopathy, lumbar region: Secondary | ICD-10-CM | POA: Diagnosis not present

## 2014-07-09 DIAGNOSIS — M4806 Spinal stenosis, lumbar region: Secondary | ICD-10-CM | POA: Diagnosis not present

## 2014-07-09 DIAGNOSIS — S81802A Unspecified open wound, left lower leg, initial encounter: Secondary | ICD-10-CM | POA: Diagnosis not present

## 2014-07-09 DIAGNOSIS — I69320 Aphasia following cerebral infarction: Secondary | ICD-10-CM | POA: Diagnosis not present

## 2014-07-09 DIAGNOSIS — K922 Gastrointestinal hemorrhage, unspecified: Secondary | ICD-10-CM | POA: Diagnosis not present

## 2014-07-09 DIAGNOSIS — D62 Acute posthemorrhagic anemia: Secondary | ICD-10-CM | POA: Diagnosis not present

## 2014-07-09 DIAGNOSIS — M5416 Radiculopathy, lumbar region: Secondary | ICD-10-CM | POA: Diagnosis not present

## 2014-07-11 DIAGNOSIS — D62 Acute posthemorrhagic anemia: Secondary | ICD-10-CM | POA: Diagnosis not present

## 2014-07-11 DIAGNOSIS — I69320 Aphasia following cerebral infarction: Secondary | ICD-10-CM | POA: Diagnosis not present

## 2014-07-11 DIAGNOSIS — M4806 Spinal stenosis, lumbar region: Secondary | ICD-10-CM | POA: Diagnosis not present

## 2014-07-11 DIAGNOSIS — M5416 Radiculopathy, lumbar region: Secondary | ICD-10-CM | POA: Diagnosis not present

## 2014-07-11 DIAGNOSIS — K922 Gastrointestinal hemorrhage, unspecified: Secondary | ICD-10-CM | POA: Diagnosis not present

## 2014-07-11 DIAGNOSIS — S81802A Unspecified open wound, left lower leg, initial encounter: Secondary | ICD-10-CM | POA: Diagnosis not present

## 2014-07-12 DIAGNOSIS — S81802A Unspecified open wound, left lower leg, initial encounter: Secondary | ICD-10-CM | POA: Diagnosis not present

## 2014-07-12 DIAGNOSIS — I69320 Aphasia following cerebral infarction: Secondary | ICD-10-CM | POA: Diagnosis not present

## 2014-07-12 DIAGNOSIS — M4806 Spinal stenosis, lumbar region: Secondary | ICD-10-CM | POA: Diagnosis not present

## 2014-07-12 DIAGNOSIS — M5416 Radiculopathy, lumbar region: Secondary | ICD-10-CM | POA: Diagnosis not present

## 2014-07-12 DIAGNOSIS — D62 Acute posthemorrhagic anemia: Secondary | ICD-10-CM | POA: Diagnosis not present

## 2014-07-12 DIAGNOSIS — K922 Gastrointestinal hemorrhage, unspecified: Secondary | ICD-10-CM | POA: Diagnosis not present

## 2014-07-15 DIAGNOSIS — S81802A Unspecified open wound, left lower leg, initial encounter: Secondary | ICD-10-CM | POA: Diagnosis not present

## 2014-07-15 DIAGNOSIS — D62 Acute posthemorrhagic anemia: Secondary | ICD-10-CM | POA: Diagnosis not present

## 2014-07-15 DIAGNOSIS — M5416 Radiculopathy, lumbar region: Secondary | ICD-10-CM | POA: Diagnosis not present

## 2014-07-15 DIAGNOSIS — K922 Gastrointestinal hemorrhage, unspecified: Secondary | ICD-10-CM | POA: Diagnosis not present

## 2014-07-15 DIAGNOSIS — I69320 Aphasia following cerebral infarction: Secondary | ICD-10-CM | POA: Diagnosis not present

## 2014-07-15 DIAGNOSIS — M4806 Spinal stenosis, lumbar region: Secondary | ICD-10-CM | POA: Diagnosis not present

## 2014-07-16 DIAGNOSIS — K922 Gastrointestinal hemorrhage, unspecified: Secondary | ICD-10-CM | POA: Diagnosis not present

## 2014-07-16 DIAGNOSIS — D62 Acute posthemorrhagic anemia: Secondary | ICD-10-CM | POA: Diagnosis not present

## 2014-07-16 DIAGNOSIS — M4806 Spinal stenosis, lumbar region: Secondary | ICD-10-CM | POA: Diagnosis not present

## 2014-07-16 DIAGNOSIS — I69320 Aphasia following cerebral infarction: Secondary | ICD-10-CM | POA: Diagnosis not present

## 2014-07-16 DIAGNOSIS — M5416 Radiculopathy, lumbar region: Secondary | ICD-10-CM | POA: Diagnosis not present

## 2014-07-16 DIAGNOSIS — S81802A Unspecified open wound, left lower leg, initial encounter: Secondary | ICD-10-CM | POA: Diagnosis not present

## 2014-07-17 DIAGNOSIS — M5416 Radiculopathy, lumbar region: Secondary | ICD-10-CM | POA: Diagnosis not present

## 2014-07-17 DIAGNOSIS — I69320 Aphasia following cerebral infarction: Secondary | ICD-10-CM | POA: Diagnosis not present

## 2014-07-17 DIAGNOSIS — K922 Gastrointestinal hemorrhage, unspecified: Secondary | ICD-10-CM | POA: Diagnosis not present

## 2014-07-17 DIAGNOSIS — S81802A Unspecified open wound, left lower leg, initial encounter: Secondary | ICD-10-CM | POA: Diagnosis not present

## 2014-07-17 DIAGNOSIS — M4806 Spinal stenosis, lumbar region: Secondary | ICD-10-CM | POA: Diagnosis not present

## 2014-07-17 DIAGNOSIS — D62 Acute posthemorrhagic anemia: Secondary | ICD-10-CM | POA: Diagnosis not present

## 2014-07-19 DIAGNOSIS — K922 Gastrointestinal hemorrhage, unspecified: Secondary | ICD-10-CM | POA: Diagnosis not present

## 2014-07-19 DIAGNOSIS — M5416 Radiculopathy, lumbar region: Secondary | ICD-10-CM | POA: Diagnosis not present

## 2014-07-19 DIAGNOSIS — D62 Acute posthemorrhagic anemia: Secondary | ICD-10-CM | POA: Diagnosis not present

## 2014-07-19 DIAGNOSIS — M4806 Spinal stenosis, lumbar region: Secondary | ICD-10-CM | POA: Diagnosis not present

## 2014-07-19 DIAGNOSIS — I69320 Aphasia following cerebral infarction: Secondary | ICD-10-CM | POA: Diagnosis not present

## 2014-07-19 DIAGNOSIS — S81802A Unspecified open wound, left lower leg, initial encounter: Secondary | ICD-10-CM | POA: Diagnosis not present

## 2014-07-22 DIAGNOSIS — M5416 Radiculopathy, lumbar region: Secondary | ICD-10-CM | POA: Diagnosis not present

## 2014-07-22 DIAGNOSIS — I69320 Aphasia following cerebral infarction: Secondary | ICD-10-CM | POA: Diagnosis not present

## 2014-07-22 DIAGNOSIS — K922 Gastrointestinal hemorrhage, unspecified: Secondary | ICD-10-CM | POA: Diagnosis not present

## 2014-07-22 DIAGNOSIS — S81802A Unspecified open wound, left lower leg, initial encounter: Secondary | ICD-10-CM | POA: Diagnosis not present

## 2014-07-22 DIAGNOSIS — M4806 Spinal stenosis, lumbar region: Secondary | ICD-10-CM | POA: Diagnosis not present

## 2014-07-22 DIAGNOSIS — D62 Acute posthemorrhagic anemia: Secondary | ICD-10-CM | POA: Diagnosis not present

## 2014-07-24 DIAGNOSIS — M5416 Radiculopathy, lumbar region: Secondary | ICD-10-CM | POA: Diagnosis not present

## 2014-07-24 DIAGNOSIS — D62 Acute posthemorrhagic anemia: Secondary | ICD-10-CM | POA: Diagnosis not present

## 2014-07-24 DIAGNOSIS — K922 Gastrointestinal hemorrhage, unspecified: Secondary | ICD-10-CM | POA: Diagnosis not present

## 2014-07-24 DIAGNOSIS — I69320 Aphasia following cerebral infarction: Secondary | ICD-10-CM | POA: Diagnosis not present

## 2014-07-24 DIAGNOSIS — M4806 Spinal stenosis, lumbar region: Secondary | ICD-10-CM | POA: Diagnosis not present

## 2014-07-24 DIAGNOSIS — S81802A Unspecified open wound, left lower leg, initial encounter: Secondary | ICD-10-CM | POA: Diagnosis not present

## 2014-07-26 ENCOUNTER — Other Ambulatory Visit: Payer: Self-pay | Admitting: *Deleted

## 2014-07-26 DIAGNOSIS — K922 Gastrointestinal hemorrhage, unspecified: Secondary | ICD-10-CM | POA: Diagnosis not present

## 2014-07-26 DIAGNOSIS — M5416 Radiculopathy, lumbar region: Secondary | ICD-10-CM | POA: Diagnosis not present

## 2014-07-26 DIAGNOSIS — S81802A Unspecified open wound, left lower leg, initial encounter: Secondary | ICD-10-CM | POA: Diagnosis not present

## 2014-07-26 DIAGNOSIS — D62 Acute posthemorrhagic anemia: Secondary | ICD-10-CM | POA: Diagnosis not present

## 2014-07-26 DIAGNOSIS — M4806 Spinal stenosis, lumbar region: Secondary | ICD-10-CM | POA: Diagnosis not present

## 2014-07-26 DIAGNOSIS — I69320 Aphasia following cerebral infarction: Secondary | ICD-10-CM | POA: Diagnosis not present

## 2014-07-29 DIAGNOSIS — S81802A Unspecified open wound, left lower leg, initial encounter: Secondary | ICD-10-CM | POA: Diagnosis not present

## 2014-07-29 DIAGNOSIS — M4806 Spinal stenosis, lumbar region: Secondary | ICD-10-CM | POA: Diagnosis not present

## 2014-07-29 DIAGNOSIS — K922 Gastrointestinal hemorrhage, unspecified: Secondary | ICD-10-CM | POA: Diagnosis not present

## 2014-07-29 DIAGNOSIS — M5416 Radiculopathy, lumbar region: Secondary | ICD-10-CM | POA: Diagnosis not present

## 2014-07-29 DIAGNOSIS — I69320 Aphasia following cerebral infarction: Secondary | ICD-10-CM | POA: Diagnosis not present

## 2014-07-29 DIAGNOSIS — D62 Acute posthemorrhagic anemia: Secondary | ICD-10-CM | POA: Diagnosis not present

## 2014-08-01 DIAGNOSIS — I69320 Aphasia following cerebral infarction: Secondary | ICD-10-CM | POA: Diagnosis not present

## 2014-08-01 DIAGNOSIS — D62 Acute posthemorrhagic anemia: Secondary | ICD-10-CM | POA: Diagnosis not present

## 2014-08-01 DIAGNOSIS — S81802A Unspecified open wound, left lower leg, initial encounter: Secondary | ICD-10-CM | POA: Diagnosis not present

## 2014-08-01 DIAGNOSIS — M5416 Radiculopathy, lumbar region: Secondary | ICD-10-CM | POA: Diagnosis not present

## 2014-08-01 DIAGNOSIS — M4806 Spinal stenosis, lumbar region: Secondary | ICD-10-CM | POA: Diagnosis not present

## 2014-08-01 DIAGNOSIS — K922 Gastrointestinal hemorrhage, unspecified: Secondary | ICD-10-CM | POA: Diagnosis not present

## 2014-08-05 DIAGNOSIS — M5416 Radiculopathy, lumbar region: Secondary | ICD-10-CM | POA: Diagnosis not present

## 2014-08-05 DIAGNOSIS — I69320 Aphasia following cerebral infarction: Secondary | ICD-10-CM | POA: Diagnosis not present

## 2014-08-05 DIAGNOSIS — M4806 Spinal stenosis, lumbar region: Secondary | ICD-10-CM | POA: Diagnosis not present

## 2014-08-05 DIAGNOSIS — D62 Acute posthemorrhagic anemia: Secondary | ICD-10-CM | POA: Diagnosis not present

## 2014-08-05 DIAGNOSIS — S81802A Unspecified open wound, left lower leg, initial encounter: Secondary | ICD-10-CM | POA: Diagnosis not present

## 2014-08-05 DIAGNOSIS — K922 Gastrointestinal hemorrhage, unspecified: Secondary | ICD-10-CM | POA: Diagnosis not present

## 2014-08-13 DIAGNOSIS — M4806 Spinal stenosis, lumbar region: Secondary | ICD-10-CM | POA: Diagnosis not present

## 2014-08-13 DIAGNOSIS — I69320 Aphasia following cerebral infarction: Secondary | ICD-10-CM | POA: Diagnosis not present

## 2014-08-13 DIAGNOSIS — D62 Acute posthemorrhagic anemia: Secondary | ICD-10-CM | POA: Diagnosis not present

## 2014-08-13 DIAGNOSIS — S81802A Unspecified open wound, left lower leg, initial encounter: Secondary | ICD-10-CM | POA: Diagnosis not present

## 2014-08-13 DIAGNOSIS — M5416 Radiculopathy, lumbar region: Secondary | ICD-10-CM | POA: Diagnosis not present

## 2014-08-13 DIAGNOSIS — K922 Gastrointestinal hemorrhage, unspecified: Secondary | ICD-10-CM | POA: Diagnosis not present

## 2014-08-15 DIAGNOSIS — I10 Essential (primary) hypertension: Secondary | ICD-10-CM | POA: Diagnosis not present

## 2014-08-15 DIAGNOSIS — I6350 Cerebral infarction due to unspecified occlusion or stenosis of unspecified cerebral artery: Secondary | ICD-10-CM | POA: Diagnosis not present

## 2014-08-15 DIAGNOSIS — H6123 Impacted cerumen, bilateral: Secondary | ICD-10-CM | POA: Diagnosis not present

## 2014-08-15 DIAGNOSIS — K922 Gastrointestinal hemorrhage, unspecified: Secondary | ICD-10-CM | POA: Diagnosis not present

## 2014-08-23 DIAGNOSIS — M4806 Spinal stenosis, lumbar region: Secondary | ICD-10-CM | POA: Diagnosis not present

## 2014-08-23 DIAGNOSIS — S81802A Unspecified open wound, left lower leg, initial encounter: Secondary | ICD-10-CM | POA: Diagnosis not present

## 2014-08-23 DIAGNOSIS — D62 Acute posthemorrhagic anemia: Secondary | ICD-10-CM | POA: Diagnosis not present

## 2014-08-23 DIAGNOSIS — I69320 Aphasia following cerebral infarction: Secondary | ICD-10-CM | POA: Diagnosis not present

## 2014-08-23 DIAGNOSIS — M5416 Radiculopathy, lumbar region: Secondary | ICD-10-CM | POA: Diagnosis not present

## 2014-08-23 DIAGNOSIS — K922 Gastrointestinal hemorrhage, unspecified: Secondary | ICD-10-CM | POA: Diagnosis not present

## 2014-11-06 DIAGNOSIS — I6350 Cerebral infarction due to unspecified occlusion or stenosis of unspecified cerebral artery: Secondary | ICD-10-CM | POA: Diagnosis not present

## 2014-11-06 DIAGNOSIS — I878 Other specified disorders of veins: Secondary | ICD-10-CM | POA: Diagnosis not present

## 2014-11-06 DIAGNOSIS — I10 Essential (primary) hypertension: Secondary | ICD-10-CM | POA: Diagnosis not present

## 2014-11-06 DIAGNOSIS — J201 Acute bronchitis due to Hemophilus influenzae: Secondary | ICD-10-CM | POA: Diagnosis not present

## 2014-11-14 DIAGNOSIS — Z961 Presence of intraocular lens: Secondary | ICD-10-CM | POA: Diagnosis not present

## 2014-11-14 DIAGNOSIS — H4011X1 Primary open-angle glaucoma, mild stage: Secondary | ICD-10-CM | POA: Diagnosis not present

## 2015-02-06 DIAGNOSIS — H40001 Preglaucoma, unspecified, right eye: Secondary | ICD-10-CM | POA: Diagnosis not present

## 2015-02-06 DIAGNOSIS — I6350 Cerebral infarction due to unspecified occlusion or stenosis of unspecified cerebral artery: Secondary | ICD-10-CM | POA: Diagnosis not present

## 2015-02-06 DIAGNOSIS — Z23 Encounter for immunization: Secondary | ICD-10-CM | POA: Diagnosis not present

## 2015-02-06 DIAGNOSIS — M792 Neuralgia and neuritis, unspecified: Secondary | ICD-10-CM | POA: Diagnosis not present

## 2015-02-06 DIAGNOSIS — I10 Essential (primary) hypertension: Secondary | ICD-10-CM | POA: Diagnosis not present

## 2015-02-25 DIAGNOSIS — S59901A Unspecified injury of right elbow, initial encounter: Secondary | ICD-10-CM | POA: Diagnosis not present

## 2015-05-12 DIAGNOSIS — H40001 Preglaucoma, unspecified, right eye: Secondary | ICD-10-CM | POA: Diagnosis not present

## 2015-05-12 DIAGNOSIS — M818 Other osteoporosis without current pathological fracture: Secondary | ICD-10-CM | POA: Diagnosis not present

## 2015-05-12 DIAGNOSIS — I6350 Cerebral infarction due to unspecified occlusion or stenosis of unspecified cerebral artery: Secondary | ICD-10-CM | POA: Diagnosis not present

## 2015-05-24 ENCOUNTER — Emergency Department (HOSPITAL_COMMUNITY): Payer: Medicare Other

## 2015-05-24 ENCOUNTER — Inpatient Hospital Stay (HOSPITAL_COMMUNITY)
Admission: EM | Admit: 2015-05-24 | Discharge: 2015-05-30 | DRG: 602 | Disposition: A | Discharge status: Skilled Nursing Facility | Payer: Medicare Other | Attending: Pulmonary Disease | Admitting: Pulmonary Disease

## 2015-05-24 ENCOUNTER — Encounter (HOSPITAL_COMMUNITY): Payer: Self-pay | Admitting: Emergency Medicine

## 2015-05-24 DIAGNOSIS — M4806 Spinal stenosis, lumbar region: Secondary | ICD-10-CM | POA: Diagnosis present

## 2015-05-24 DIAGNOSIS — I69328 Other speech and language deficits following cerebral infarction: Secondary | ICD-10-CM

## 2015-05-24 DIAGNOSIS — G92 Toxic encephalopathy: Secondary | ICD-10-CM | POA: Diagnosis present

## 2015-05-24 DIAGNOSIS — H409 Unspecified glaucoma: Secondary | ICD-10-CM | POA: Diagnosis not present

## 2015-05-24 DIAGNOSIS — S81801A Unspecified open wound, right lower leg, initial encounter: Secondary | ICD-10-CM

## 2015-05-24 DIAGNOSIS — I10 Essential (primary) hypertension: Secondary | ICD-10-CM | POA: Diagnosis present

## 2015-05-24 DIAGNOSIS — H5442 Blindness, left eye, normal vision right eye: Secondary | ICD-10-CM | POA: Diagnosis present

## 2015-05-24 DIAGNOSIS — L0390 Cellulitis, unspecified: Secondary | ICD-10-CM | POA: Diagnosis present

## 2015-05-24 DIAGNOSIS — B3789 Other sites of candidiasis: Secondary | ICD-10-CM | POA: Diagnosis not present

## 2015-05-24 DIAGNOSIS — I69320 Aphasia following cerebral infarction: Secondary | ICD-10-CM

## 2015-05-24 DIAGNOSIS — R600 Localized edema: Secondary | ICD-10-CM | POA: Diagnosis not present

## 2015-05-24 DIAGNOSIS — M5416 Radiculopathy, lumbar region: Secondary | ICD-10-CM

## 2015-05-24 DIAGNOSIS — R0602 Shortness of breath: Secondary | ICD-10-CM

## 2015-05-24 DIAGNOSIS — H9190 Unspecified hearing loss, unspecified ear: Secondary | ICD-10-CM | POA: Diagnosis present

## 2015-05-24 DIAGNOSIS — Z66 Do not resuscitate: Secondary | ICD-10-CM | POA: Diagnosis present

## 2015-05-24 DIAGNOSIS — M48061 Spinal stenosis, lumbar region without neurogenic claudication: Secondary | ICD-10-CM | POA: Diagnosis present

## 2015-05-24 DIAGNOSIS — L03115 Cellulitis of right lower limb: Principal | ICD-10-CM | POA: Diagnosis present

## 2015-05-24 DIAGNOSIS — I6930 Unspecified sequelae of cerebral infarction: Secondary | ICD-10-CM

## 2015-05-24 DIAGNOSIS — B3749 Other urogenital candidiasis: Secondary | ICD-10-CM | POA: Diagnosis present

## 2015-05-24 LAB — BASIC METABOLIC PANEL
Anion gap: 9 (ref 5–15)
BUN: 34 mg/dL — AB (ref 6–20)
CHLORIDE: 103 mmol/L (ref 101–111)
CO2: 29 mmol/L (ref 22–32)
CREATININE: 1.08 mg/dL — AB (ref 0.44–1.00)
Calcium: 9.2 mg/dL (ref 8.9–10.3)
GFR calc Af Amer: 50 mL/min — ABNORMAL LOW (ref >60)
GFR, EST NON AFRICAN AMERICAN: 43 mL/min — AB (ref >60)
GLUCOSE: 115 mg/dL — AB (ref 65–99)
POTASSIUM: 3.6 mmol/L (ref 3.5–5.1)
Sodium: 141 mmol/L (ref 135–145)

## 2015-05-24 LAB — CBC WITH DIFFERENTIAL/PLATELET
Basophils Absolute: 0.1 10*3/uL (ref 0.0–0.1)
Basophils Relative: 0 %
Eosinophils Absolute: 0.1 10*3/uL (ref 0.0–0.7)
Eosinophils Relative: 1 %
HEMATOCRIT: 39.1 % (ref 36.0–46.0)
Hemoglobin: 12.8 g/dL (ref 12.0–15.0)
LYMPHS ABS: 2.7 10*3/uL (ref 0.7–4.0)
Lymphocytes Relative: 20 %
MCH: 31.1 pg (ref 26.0–34.0)
MCHC: 32.7 g/dL (ref 30.0–36.0)
MCV: 94.9 fL (ref 78.0–100.0)
MONOS PCT: 9 %
Monocytes Absolute: 1.2 10*3/uL — ABNORMAL HIGH (ref 0.1–1.0)
NEUTROS ABS: 9.1 10*3/uL — AB (ref 1.7–7.7)
Neutrophils Relative %: 70 %
Platelets: 414 10*3/uL — ABNORMAL HIGH (ref 150–400)
RBC: 4.12 MIL/uL (ref 3.87–5.11)
RDW: 15.3 % (ref 11.5–15.5)
WBC: 13.2 10*3/uL — ABNORMAL HIGH (ref 4.0–10.5)

## 2015-05-24 LAB — LACTIC ACID, PLASMA
Lactic Acid, Venous: 1.1 mmol/L (ref 0.5–2.0)
Lactic Acid, Venous: 0.8 mmol/L (ref 0.5–2.0)

## 2015-05-24 MED ORDER — FERROUS SULFATE 325 (65 FE) MG PO TABS
325.0000 mg | ORAL_TABLET | Freq: Every day | ORAL | Status: DC
  Administered 2015-05-25 – 2015-05-30 (×6): 325 mg via ORAL
  Filled 2015-05-24 (×6): qty 1

## 2015-05-24 MED ORDER — GATIFLOXACIN 0.5 % OP SOLN
1.0000 [drp] | Freq: Four times a day (QID) | OPHTHALMIC | Status: DC
  Administered 2015-05-25 – 2015-05-30 (×18): 1 [drp] via OPHTHALMIC
  Filled 2015-05-24: qty 2.5

## 2015-05-24 MED ORDER — CLINDAMYCIN PHOSPHATE 600 MG/50ML IV SOLN
INTRAVENOUS | Status: AC
  Filled 2015-05-24: qty 50

## 2015-05-24 MED ORDER — LOSARTAN POTASSIUM 50 MG PO TABS
100.0000 mg | ORAL_TABLET | Freq: Every day | ORAL | Status: DC
  Administered 2015-05-25 – 2015-05-30 (×6): 100 mg via ORAL
  Filled 2015-05-24 (×6): qty 2

## 2015-05-24 MED ORDER — TETANUS-DIPHTHERIA TOXOIDS TD 5-2 LFU IM INJ
0.5000 mL | INJECTION | Freq: Once | INTRAMUSCULAR | Status: DC
  Filled 2015-05-24: qty 0.5

## 2015-05-24 MED ORDER — ALBUTEROL SULFATE (2.5 MG/3ML) 0.083% IN NEBU
3.0000 mL | INHALATION_SOLUTION | Freq: Four times a day (QID) | RESPIRATORY_TRACT | Status: DC | PRN
  Administered 2015-05-28: 3 mL via RESPIRATORY_TRACT
  Filled 2015-05-24: qty 3

## 2015-05-24 MED ORDER — GABAPENTIN 100 MG PO CAPS
100.0000 mg | ORAL_CAPSULE | Freq: Three times a day (TID) | ORAL | Status: DC
  Administered 2015-05-24 – 2015-05-30 (×17): 100 mg via ORAL
  Filled 2015-05-24 (×17): qty 1

## 2015-05-24 MED ORDER — AMLODIPINE BESYLATE 5 MG PO TABS
5.0000 mg | ORAL_TABLET | Freq: Every day | ORAL | Status: DC
  Administered 2015-05-25 – 2015-05-30 (×6): 5 mg via ORAL
  Filled 2015-05-24 (×6): qty 1

## 2015-05-24 MED ORDER — TIMOLOL MALEATE 0.25 % OP SOLN
1.0000 [drp] | Freq: Two times a day (BID) | OPHTHALMIC | Status: DC
  Administered 2015-05-25 – 2015-05-30 (×12): 1 [drp] via OPHTHALMIC
  Filled 2015-05-24 (×3): qty 5

## 2015-05-24 MED ORDER — MIDODRINE HCL 5 MG PO TABS
2.5000 mg | ORAL_TABLET | Freq: Two times a day (BID) | ORAL | Status: DC
  Administered 2015-05-25 – 2015-05-30 (×11): 2.5 mg via ORAL
  Filled 2015-05-24 (×11): qty 1

## 2015-05-24 MED ORDER — CLINDAMYCIN PHOSPHATE 600 MG/50ML IV SOLN
600.0000 mg | Freq: Three times a day (TID) | INTRAVENOUS | Status: DC
  Administered 2015-05-25 – 2015-05-30 (×17): 600 mg via INTRAVENOUS
  Filled 2015-05-24 (×27): qty 50

## 2015-05-24 MED ORDER — CLINDAMYCIN PHOSPHATE 600 MG/50ML IV SOLN
600.0000 mg | Freq: Once | INTRAVENOUS | Status: AC
  Administered 2015-05-24: 600 mg via INTRAVENOUS
  Filled 2015-05-24: qty 50

## 2015-05-24 MED ORDER — TETANUS-DIPHTH-ACELL PERTUSSIS 5-2.5-18.5 LF-MCG/0.5 IM SUSP
0.5000 mL | Freq: Once | INTRAMUSCULAR | Status: AC
  Administered 2015-05-25: 0.5 mL via INTRAMUSCULAR
  Filled 2015-05-24: qty 0.5

## 2015-05-24 MED ORDER — METOPROLOL TARTRATE 25 MG PO TABS
25.0000 mg | ORAL_TABLET | Freq: Two times a day (BID) | ORAL | Status: DC
  Administered 2015-05-24 – 2015-05-30 (×12): 25 mg via ORAL
  Filled 2015-05-24 (×12): qty 1

## 2015-05-24 MED ORDER — ENOXAPARIN SODIUM 30 MG/0.3ML ~~LOC~~ SOLN
30.0000 mg | SUBCUTANEOUS | Status: DC
  Administered 2015-05-24 – 2015-05-29 (×6): 30 mg via SUBCUTANEOUS
  Filled 2015-05-24 (×6): qty 0.3

## 2015-05-24 NOTE — H&P (Signed)
[facility] History and Physical  Carla Bonilla:[id number] DOB: May 31, 1920    PCP:   Fredirick Maudlin, MD   Chief Complaint: Wound Infection  HPI: Carla Bonilla is an 82 y.o. female with a hx of HTN and a prior CVA with residual aphasia that presents s/p a fall that occurred 1 week ago. Patient states she lives alone and has caregivers. 1 week ago she fell and sustained a shallow wound on her right lower shin. She denies hitting her head or any other injury. Over the last 3 days, her RLE has become more swollen and red. Per her caregiver, she had a blister that appeared on her right shin and popped with pus coming out of it.  She denies any pain in the extremity, SOB, CP, numbness, tingling, or fevers. While in the ED, labs revealed a creatinine 1.08, WBC 13.2, and negative lactic acid. RLE was negative for fx. Hospitalist was asked to admit the patient for RLE cellulitis.  Patient is unaware of her last tetanus shot.   Rewiew of Systems:  Constitutional: Negative for malaise, fever and chills. No significant weight loss or weight gain Eyes: Negative for eye pain, redness and discharge, diplopia, visual changes, or flashes of light. ENMT: Negative for ear pain, hoarseness, nasal congestion, sinus pressure and sore throat. No headaches; tinnitus, drooling, or problem swallowing. Cardiovascular: Negative for chest pain, palpitations, diaphoresis, dyspnea and peripheral edema. ; No orthopnea, PND Respiratory: Negative for cough, hemoptysis, wheezing and stridor. No pleuritic chestpain. Gastrointestinal: Negative for nausea, vomiting, diarrhea, constipation, abdominal pain, melena, blood in stool, hematemesis, jaundice and rectal bleeding.    Genitourinary: Negative for frequency, dysuria, incontinence,flank pain and hematuria; Musculoskeletal: RLE redness and swelling. Negative for back pain and neck pain.  Skin: . Negative for pruritus, rash, abrasions, bruising and skin  lesion.; ulcerations Neuro: Negative for headache, lightheadedness and neck stiffness. Negative for weakness, altered level of consciousness , altered mental status, extremity weakness, burning feet, involuntary movement, seizure and syncope.  Psych: negative for anxiety, depression, insomnia, tearfulness, panic attacks, hallucinations, paranoia, suicidal or homicidal ideation     Past Medical History  Diagnosis Date  . Hypertension   . Blind left eye   . Glaucoma   . Hearing loss   . CVA (cerebral vascular accident) [facility])     speech problem    Past Surgical History  Procedure Laterality Date  . Shoulder surgery      RIGHT shoulder open treatment internal fixation with locking plate. Date of surgery November 25, 2010  . Orif humerus fracture  11/25/2010    Procedure: OPEN REDUCTION INTERNAL FIXATION (ORIF) PROXIMAL HUMERUS FRACTURE;  Surgeon: Fuller Canada, MD;  Location: [facility];  Service: Orthopedics;  Laterality: Right;  . Eye surgery      left    Medications:  HOME MEDS: Prior to Admission medications   Medication Sig Start Date End Date Taking? Authorizing Provider  amLODipine (NORVASC) 5 MG tablet Take 5 mg by mouth daily.   Yes Historical Provider, MD  ferrous sulfate (FERROUSUL) 325 (65 FE) MG tablet Take 1 tablet (325 mg total) by mouth daily with breakfast. 06/27/14  Yes Kari Baars, MD  gabapentin (NEURONTIN) 100 MG capsule Take 1 capsule (100 mg total) by mouth 3 (three) times daily. 07/17/13  Yes Vickki Hearing, MD  metoprolol tartrate (LOPRESSOR) 25 MG tablet Take 25 mg by mouth 2 (two) times daily.     Yes Historical Provider, MD  moxifloxacin (VIGAMOX) 0.5 % ophthalmic  solution Place 1 drop into the left eye 2 (two) times daily.    Yes Historical Provider, MD  PROAIR HFA 108 (90 BASE) MCG/ACT inhaler Inhale 2 puffs into the lungs every 6 (six) hours as needed for wheezing or shortness of breath.  01/04/13  Yes Historical Provider, MD  timolol (TIMOPTIC) 0.25 %  ophthalmic solution Place 1 drop into the right eye 2 (two) times daily.    Yes Historical Provider, MD  furosemide (LASIX) 40 MG tablet Take 40 mg by mouth daily. 01/29/13   Historical Provider, MD  losartan (COZAAR) 100 MG tablet Take 100 mg by mouth daily. 01/29/13   Historical Provider, MD  midodrine (PROAMATINE) 2.5 MG tablet Take 1 tablet (2.5 mg total) by mouth 2 (two) times daily with a meal. 06/27/14   Kari BaarsEdward Hawkins, MD     Allergies:  No Known Allergies  Social History:   reports that she has never smoked. She has never used smokeless tobacco. She reports that she does not drink alcohol or use illicit drugs.  Family History: Family History  Problem Relation Age of Onset  . Heart disease    . Colon cancer Neg Hx      Physical Exam: Filed Vitals:   05/24/15 1744 05/24/15 2025  BP: 168/59 159/71  Pulse: 85 96  Temp: 97.7 F (36.5 C) 97.8 F (36.6 C)  TempSrc: Oral Oral  Resp: 18 18  Height: 5\' 4"  (1.626 m)   Weight: 63.05 kg (139 lb)   SpO2: 99% 98%   Blood pressure 159/71, pulse 96, temperature 97.8 F (36.6 C), temperature source Oral, resp. rate 18, height 5\' 4"  (1.626 m), weight 63.05 kg (139 lb), SpO2 98 %.  GEN:  Pleasant. Patient lying in the stretcher in no acute distress; cooperative with exam. PSYCH:  alert and oriented x4; does not appear anxious or depressed; affect is appropriate. HEENT: Mucous membranes pink and anicteric; Glaucoma in the left eye. No cervical lymphadenopathy nor thyromegaly or carotid bruit; no JVD; There were no stridor. Neck is very supple. Breasts:: Not examined CHEST WALL: No tenderness CHEST: Normal respiration, clear to auscultation bilaterally.  HEART: Regular rate and rhythm.  There are no murmur, rub, or gallops.   BACK: No kyphosis or scoliosis; no CVA tenderness ABDOMEN: soft and non-tender; no masses, no organomegaly, normal abdominal bowel sounds; no pannus; no intertriginous candida. There is no rebound and no  distention. Rectal Exam: Not done EXTREMITIES: RLE is shortened and externally rotated from prior hip fx. No TTP with manipulation of RLE. Erythema of the RLE.  There is no calf tenderness. Genitalia: not examined PULSES: 2+ and symmetric SKIN: Normal hydration  CNS: Cranial nerves 2-12 grossly intact no focal lateralizing neurologic deficit. Residual aphasia from prior CVA; uvula elevated with phonation, facial symmetry and tongue midline. DTR are normal bilaterally, cerebella exam is intact, barbinski is negative and strengths are equaled bilaterally.  No sensory loss.   Labs on Admission:  Basic Metabolic Panel:  Recent Labs Lab 05/24/15 1958  NA 141  K 3.6  CL 103  CO2 29  GLUCOSE 115*  BUN 34*  CREATININE 1.08*  CALCIUM 9.2    CBC:  Recent Labs Lab 05/24/15 1958  WBC 13.2*  NEUTROABS 9.1*  HGB 12.8  HCT 39.1  MCV 94.9  PLT 414*      Radiological Exams on Admission: Dg Tibia/fibula Right  05/24/2015  CLINICAL DATA:  82 year old female with fall and wound to the anterior mid tibia/fibula. EXAM: RIGHT  TIBIA AND FIBULA - 2 VIEW COMPARISON:  None. FINDINGS: There is no acute fracture or dislocation. The bones are mildly osteopenic. Mild osteoarthritic changes of the knee with narrowing of the lateral compartment noted. There is diffuse subcutaneous soft tissue edema of the lower extremity. IMPRESSION: Diffuse soft tissue edema.  No acute fracture or dislocation. Electronically Signed   By: Elgie Collard M.D.   On: 05/24/2015 21:19    EKG: Independently reviewed.     Assessment/Plan Cellulitis Essential HTN  PLAN: Patient presents with RLE cellulitis, secondary to a recent fall. Will update her Tetanus and continue IV Clindamycin. RLE was demarcated with the time and date. Will continue all chronic medications.  Will admit to Dr Juanetta Gosling as per prior arrangement.  Thank you and Good Day.   Other plans as per orders. Code Status: Full   Houston Siren, MD. FACP [facility] Pager [phone] 7pm to 7am.  05/24/2015, 9:52 PM  By signing my name below, I, Burnett Harry, attest that this documentation has been prepared under the direction and in the presence of Houston Siren, MD Electronically Signed: Burnett Harry, Scribe. 05/24/2015  I, Dr. Houston Siren, personally performed the services described in this documentation. All medical record entries made by the scribe were at my discretion and in my presence. Houston Siren, MD 05/24/2015

## 2015-05-24 NOTE — ED Notes (Signed)
Patient reports falling a week ago and hitting right leg. Patient has wound with serosanguinous drainage. Significant swelling and redness noted. Patient denies any pain. Patient has gauze dressing over wound. Some old drainage noted through bandage. Patient unsure of last tetanus vaccination.

## 2015-05-24 NOTE — ED Provider Notes (Signed)
CSN: [id number]     Arrival date & time 05/24/15  1737 History   First MD Initiated Contact with Patient 05/24/15 1938     Chief Complaint  Patient presents with  . Wound Infection      HPI Pt was seen at 1940. Per pt and her caregivers: c/o gradual onset and worsening of persistent RLE "redness" and "swelling" for the past 1 week. Pt fell one week ago and injured her RLE. Pt "had a shallow wound" on her anterior shin after the fall. Pt's caregivers state over the past 3 days, pt's RLE has become "more swollen and red" and "a blister came up and then popped with pus coming out of it." Caregivers have not taken pt's temperature. Denies focal motor weakness, no tingling/numbness in extremities, no other injuries from the fall.    Past Medical History  Diagnosis Date  . Hypertension   . Blind left eye   . Glaucoma   . Hearing loss   . CVA (cerebral vascular accident) [facility](H[facility])     speech problem   Past Surgical History  Procedure Laterality Date  . Shoulder surgery      RIGHT shoulder open treatment internal fixation with locking plate. Date of surgery November 25, 2010  . Orif humerus fracture  11/25/2010    Procedure: OPEN REDUCTION INTERNAL FIXATION (ORIF) PROXIMAL HUMERUS FRACTURE;  Surgeon: Fuller CanadaStanley Harrison, MD;  Location: [facility];  Service: Orthopedics;  Laterality: Right;  . Eye surgery      left   Family History  Problem Relation Age of Onset  . Heart disease    . Colon cancer Neg Hx    Social History  Substance Use Topics  . Smoking status: Never Smoker   . Smokeless tobacco: Never Used  . Alcohol Use: No    Review of Systems ROS: Statement: All systems negative except as marked or noted in the HPI; Constitutional: Negative for fever and chills. ; ; Eyes: Negative for eye pain, redness and discharge. ; ; ENMT: Negative for ear pain, hoarseness, nasal congestion, sinus pressure and sore throat. ; ; Cardiovascular: Negative for chest pain, palpitations, diaphoresis, dyspnea  and peripheral edema. ; ; Respiratory: Negative for cough, wheezing and stridor. ; ; Gastrointestinal: Negative for nausea, vomiting, diarrhea, abdominal pain, blood in stool, hematemesis, jaundice and rectal bleeding. . ; ; Genitourinary: Negative for dysuria, flank pain and hematuria. ; ; Musculoskeletal: Negative for back pain and neck pain. Negative for deformity.; ; Skin: +rash, ulcers, swelling. Negative for pruritus, bruising.; ; Neuro: Negative for headache, lightheadedness and neck stiffness. Negative for weakness, altered level of consciousness , altered mental status, extremity weakness, paresthesias, involuntary movement, seizure and syncope.     Allergies  Review of patient's allergies indicates no known allergies.  Home Medications   Prior to Admission medications   Medication Sig Start Date End Date Taking? Authorizing Provider  amLODipine (NORVASC) 5 MG tablet Take 5 mg by mouth daily.    Historical Provider, MD  ferrous sulfate (FERROUSUL) 325 (65 FE) MG tablet Take 1 tablet (325 mg total) by mouth daily with breakfast. 06/27/14   Kari BaarsEdward Hawkins, MD  furosemide (LASIX) 40 MG tablet Take 40 mg by mouth daily. 01/29/13   Historical Provider, MD  gabapentin (NEURONTIN) 100 MG capsule Take 1 capsule (100 mg total) by mouth 3 (three) times daily. 07/17/13   Vickki HearingStanley E Harrison, MD  losartan (COZAAR) 100 MG tablet Take 100 mg by mouth daily. 01/29/13   Historical Provider, MD  metoprolol tartrate (LOPRESSOR) 25 MG tablet Take 25 mg by mouth 2 (two) times daily.      Historical Provider, MD  midodrine (PROAMATINE) 2.5 MG tablet Take 1 tablet (2.5 mg total) by mouth 2 (two) times daily with a meal. 06/27/14   Kari Baars, MD  moxifloxacin (VIGAMOX) 0.5 % ophthalmic solution Place 1 drop into the left eye 2 (two) times daily.     Historical Provider, MD  PROAIR HFA 108 (90 BASE) MCG/ACT inhaler Inhale 2 puffs into the lungs every 6 (six) hours as needed for wheezing or shortness of breath.   01/04/13   Historical Provider, MD  timolol (TIMOPTIC) 0.25 % ophthalmic solution Place 1 drop into the right eye 2 (two) times daily.     Historical Provider, MD   BP 159/71 mmHg  Pulse 96  Temp(Src) 97.8 F (36.6 C) (Oral)  Resp 18  Ht  (1.626 m)  Wt 139 lb (63.05 kg)  BMI 23.85 kg/m2  SpO2 98% Physical Exam  1945: Physical examination:  Nursing notes reviewed; Vital signs and O2 SAT reviewed;  Constitutional: Well developed, Well nourished, Well hydrated, In no acute distress; Head:  Normocephalic, atraumatic; Eyes: EOMI, PERRL, No scleral icterus; ENMT: Mouth and pharynx normal, Mucous membranes moist; Neck: Supple, Full range of motion, No lymphadenopathy; Cardiovascular: Regular rate and rhythm, No gallop; Respiratory: Breath sounds clear & equal bilaterally, No wheezes. Normal respiratory effort/excursion; Chest: Nontender, Movement normal; Abdomen: Soft, Nontender, Nondistended, Normal bowel sounds; Genitourinary: No CVA tenderness; Extremities: Pulses normal, No tenderness, +2 pedal edema, warmth, and erythema RLE foot to knee with 2 shallow open ulcers, +calf asymmetry.; Neuro: AA&Ox3, +HOH. Speech minimal but clear. Moves extremities spontaneously.; Skin: Color normal, Warm, Dry.   ED Course  Procedures (including critical care time) Labs Review  Imaging Review  I have personally reviewed and evaluated these images and lab results as part of my medical decision-making.   EKG Interpretation None      MDM  MDM Reviewed: previous chart, nursing note and vitals Reviewed previous: labs Interpretation: labs and x-ray   Results for orders placed or performed during the hospital encounter of 05/24/15  Basic metabolic panel  Result Value Ref Range   Sodium 141 135 - 145 mmol/L   Potassium 3.6 3.5 - 5.1 mmol/L   Chloride 103 101 - 111 mmol/L   CO2 29 22 - 32 mmol/L   Glucose, Bld 115 (H) 65 - 99 mg/dL   BUN 34 (H) 6 - 20 mg/dL   Creatinine, Ser 1.61 (H) 0.44 - 1.00  mg/dL   Calcium 9.2 8.9 - 09.6 mg/dL   GFR calc non Af Amer 43 (L) >60 mL/min   GFR calc Af Amer 50 (L) >60 mL/min   Anion gap 9 5 - 15  Lactic acid, plasma  Result Value Ref Range   Lactic Acid, Venous 1.1 0.5 - 2.0 mmol/L  CBC with Differential  Result Value Ref Range   WBC 13.2 (H) 4.0 - 10.5 K/uL   RBC 4.12 3.87 - 5.11 MIL/uL   Hemoglobin 12.8 12.0 - 15.0 g/dL   HCT 04.5 40.9 - 81.1 %   MCV 94.9 78.0 - 100.0 fL   MCH 31.1 26.0 - 34.0 pg   MCHC 32.7 30.0 - 36.0 g/dL   RDW 91.4 78.2 - 95.6 %   Platelets 414 (H) 150 - 400 K/uL   Neutrophils Relative % 70 %   Neutro Abs 9.1 (H) 1.7 - 7.7 K/uL   Lymphocytes  Relative 20 %   Lymphs Abs 2.7 0.7 - 4.0 K/uL   Monocytes Relative 9 %   Monocytes Absolute 1.2 (H) 0.1 - 1.0 K/uL   Eosinophils Relative 1 %   Eosinophils Absolute 0.1 0.0 - 0.7 K/uL   Basophils Relative 0 %   Basophils Absolute 0.1 0.0 - 0.1 K/uL   Dg Tibia/fibula Right 05/24/2015  CLINICAL DATA:  82 year old female with fall and wound to the anterior mid tibia/fibula. EXAM: RIGHT TIBIA AND FIBULA - 2 VIEW COMPARISON:  None. FINDINGS: There is no acute fracture or dislocation. The bones are mildly osteopenic. Mild osteoarthritic changes of the knee with narrowing of the lateral compartment noted. There is diffuse subcutaneous soft tissue edema of the lower extremity. IMPRESSION: Diffuse soft tissue edema.  No acute fracture or dislocation. Electronically Signed   By: Elgie Collard M.D.   On: 05/24/2015 21:19    2145:  EPIC chart reviewed: Td updated 2014 and 2015. Will dose IV abx for cellulitis. Dx and testing d/w pt and cargivers.  Questions answered.  Verb understanding, agreeable to observation admit.  T/C to [facility] Dr. Conley Rolls, case discussed, including:  HPI, pertinent PM/SHx, VS/PE, dx testing, ED course and treatment:  Agreeable to admit, requests to write temporary orders, obtain medical bed to Dr. Juanetta Gosling' service.   Samuel Jester, DO 05/28/15 [phone]

## 2015-05-25 DIAGNOSIS — I69328 Other speech and language deficits following cerebral infarction: Secondary | ICD-10-CM | POA: Diagnosis not present

## 2015-05-25 DIAGNOSIS — H409 Unspecified glaucoma: Secondary | ICD-10-CM | POA: Diagnosis present

## 2015-05-25 DIAGNOSIS — M5416 Radiculopathy, lumbar region: Secondary | ICD-10-CM | POA: Diagnosis not present

## 2015-05-25 DIAGNOSIS — I6930 Unspecified sequelae of cerebral infarction: Secondary | ICD-10-CM | POA: Diagnosis not present

## 2015-05-25 DIAGNOSIS — R278 Other lack of coordination: Secondary | ICD-10-CM | POA: Diagnosis not present

## 2015-05-25 DIAGNOSIS — M6281 Muscle weakness (generalized): Secondary | ICD-10-CM | POA: Diagnosis not present

## 2015-05-25 DIAGNOSIS — R0602 Shortness of breath: Secondary | ICD-10-CM | POA: Diagnosis not present

## 2015-05-25 DIAGNOSIS — H9190 Unspecified hearing loss, unspecified ear: Secondary | ICD-10-CM | POA: Diagnosis present

## 2015-05-25 DIAGNOSIS — M4806 Spinal stenosis, lumbar region: Secondary | ICD-10-CM | POA: Diagnosis present

## 2015-05-25 DIAGNOSIS — B3749 Other urogenital candidiasis: Secondary | ICD-10-CM | POA: Diagnosis not present

## 2015-05-25 DIAGNOSIS — R262 Difficulty in walking, not elsewhere classified: Secondary | ICD-10-CM | POA: Diagnosis not present

## 2015-05-25 DIAGNOSIS — I10 Essential (primary) hypertension: Secondary | ICD-10-CM | POA: Diagnosis present

## 2015-05-25 DIAGNOSIS — Z66 Do not resuscitate: Secondary | ICD-10-CM | POA: Diagnosis present

## 2015-05-25 DIAGNOSIS — I69320 Aphasia following cerebral infarction: Secondary | ICD-10-CM | POA: Diagnosis not present

## 2015-05-25 DIAGNOSIS — G92 Toxic encephalopathy: Secondary | ICD-10-CM | POA: Diagnosis present

## 2015-05-25 DIAGNOSIS — L03116 Cellulitis of left lower limb: Secondary | ICD-10-CM | POA: Diagnosis not present

## 2015-05-25 DIAGNOSIS — R1312 Dysphagia, oropharyngeal phase: Secondary | ICD-10-CM | POA: Diagnosis not present

## 2015-05-25 DIAGNOSIS — H5442 Blindness, left eye, normal vision right eye: Secondary | ICD-10-CM | POA: Diagnosis present

## 2015-05-25 DIAGNOSIS — B3789 Other sites of candidiasis: Secondary | ICD-10-CM | POA: Diagnosis present

## 2015-05-25 DIAGNOSIS — L03115 Cellulitis of right lower limb: Secondary | ICD-10-CM | POA: Diagnosis not present

## 2015-05-25 DIAGNOSIS — Z9181 History of falling: Secondary | ICD-10-CM | POA: Diagnosis not present

## 2015-05-25 MED ORDER — LORAZEPAM 2 MG/ML IJ SOLN
0.5000 mg | INTRAMUSCULAR | Status: DC | PRN
  Administered 2015-05-25 – 2015-05-28 (×3): 0.5 mg via INTRAVENOUS
  Filled 2015-05-25 (×3): qty 1

## 2015-05-25 MED ORDER — LORAZEPAM 0.5 MG PO TABS
0.5000 mg | ORAL_TABLET | ORAL | Status: DC | PRN
  Administered 2015-05-27: 0.5 mg via ORAL
  Filled 2015-05-25: qty 1

## 2015-05-25 NOTE — Progress Notes (Signed)
Wound to RLE cleaned with normal saline, patted dry, placed telfa over 3 X 4 cm skin tear, covered with gauze and wrapped with gauze secured with tape.  Patient tolerated well.  Will continue to monitor.  Scab below skin tear intact.

## 2015-05-25 NOTE — Progress Notes (Signed)
Patient anxious, trying to climb out of bed, crying, Dr. Ouida SillsFagan called and ordered Lorazepam prn.

## 2015-05-25 NOTE — Progress Notes (Signed)
Assisted to bathroom with assist X 2 using wheelchair.  Voided and passed large BM.  Assisted back to bed, resting comfortably.  Family at bedside being attentive.

## 2015-05-25 NOTE — Progress Notes (Signed)
Subjective: Carla Bonilla was admitted yesterday with right lower extremity cellulitis. She had fallen this past week and injured her right shin. She had developed redness. She denies pain today. No fever.  Objective: Vital signs in last 24 hours: Filed Vitals:   05/24/15 1744 05/24/15 2025 05/24/15 2310 05/25/15 0429  BP: 168/59 159/71 163/68 148/70  Pulse: 85 96  90  Temp: 97.7 F (36.5 C) 97.8 F (36.6 C) 97.5 F (36.4 C) 98.1 F (36.7 C)  TempSrc: Oral Oral Oral Oral  Resp: Height:  (1.626 m)   (1.549 m)   Weight: 139 lb (63.05 kg)  135 lb 12.9 oz (61.6 kg)   SpO2: 99% 98% 96% 93%   Weight change:   Intake/Output Summary (Last 24 hours) at 05/25/15 0928 Last data filed at 05/25/15 0908  Gross per 24 hour  Intake    360 ml  Output    200 ml  Net    160 ml    Physical Exam: Alert. Post stroke speech changes. Lungs clear. Heart regular with no murmurs. Abdomen soft and nontender. Extremities reveal diffuse redness in the right lower extremity with a superficial 4 x 3 cm wound anteriorly and a small 1 cm scabbed area inferiorly.  Lab Results:    Results for orders placed or performed during the hospital encounter of 05/24/15 (from the past 24 hour(s))  Basic metabolic panel     Status: Abnormal   Collection Time: 05/24/15  7:58 PM  Result Value Ref Range   Sodium 141 135 - 145 mmol/L   Potassium 3.6 3.5 - 5.1 mmol/L   Chloride 103 101 - 111 mmol/L   CO2 29 22 - 32 mmol/L   Glucose, Bld 115 (H) 65 - 99 mg/dL   BUN 34 (H) 6 - 20 mg/dL   Creatinine, Ser 1.61 (H) 0.44 - 1.00 mg/dL   Calcium 9.2 8.9 - 09.6 mg/dL   GFR calc non Af Amer 43 (L) >60 mL/min   GFR calc Af Amer 50 (L) >60 mL/min   Anion gap 9 5 - 15  Lactic acid, plasma     Status: None   Collection Time: 05/24/15  7:58 PM  Result Value Ref Range   Lactic Acid, Venous 1.1 0.5 - 2.0 mmol/L  CBC with Differential     Status: Abnormal   Collection Time: 05/24/15  7:58 PM  Result Value  Ref Range   WBC 13.2 (H) 4.0 - 10.5 K/uL   RBC 4.12 3.87 - 5.11 MIL/uL   Hemoglobin 12.8 12.0 - 15.0 g/dL   HCT 04.5 40.9 - 81.1 %   MCV 94.9 78.0 - 100.0 fL   MCH 31.1 26.0 - 34.0 pg   MCHC 32.7 30.0 - 36.0 g/dL   RDW 91.4 78.2 - 95.6 %   Platelets 414 (H) 150 - 400 K/uL   Neutrophils Relative % 70 %   Neutro Abs 9.1 (H) 1.7 - 7.7 K/uL   Lymphocytes Relative 20 %   Lymphs Abs 2.7 0.7 - 4.0 K/uL   Monocytes Relative 9 %   Monocytes Absolute 1.2 (H) 0.1 - 1.0 K/uL   Eosinophils Relative 1 %   Eosinophils Absolute 0.1 0.0 - 0.7 K/uL   Basophils Relative 0 %   Basophils Absolute 0.1 0.0 - 0.1 K/uL  Lactic acid, plasma     Status: None   Collection Time: 05/24/15 10:36 PM  Result Value Ref Range   Lactic Acid, Venous 0.8  0.5 - 2.0 mmol/L     ABGS No results for input(s): PHART, PO2ART, TCO2, HCO3 in the last 72 hours.  Invalid input(s): PCO2 CULTURES No results found for this or any previous visit (from the past 240 hour(s)). Studies/Results: Dg Tibia/fibula Right  05/24/2015  CLINICAL DATA:  82 year old female with fall and wound to the anterior mid tibia/fibula. EXAM: RIGHT TIBIA AND FIBULA - 2 VIEW COMPARISON:  None. FINDINGS: There is no acute fracture or dislocation. The bones are mildly osteopenic. Mild osteoarthritic changes of the knee with narrowing of the lateral compartment noted. There is diffuse subcutaneous soft tissue edema of the lower extremity. IMPRESSION: Diffuse soft tissue edema.  No acute fracture or dislocation. Electronically Signed   By: Elgie CollardArash  Radparvar M.D.   On: 05/24/2015 21:19   Micro Results: No results found for this or any previous visit (from the past 240 hour(s)). Studies/Results: Dg Tibia/fibula Right  05/24/2015  CLINICAL DATA:  82 year old female with fall and wound to the anterior mid tibia/fibula. EXAM: RIGHT TIBIA AND FIBULA - 2 VIEW COMPARISON:  None. FINDINGS: There is no acute fracture or dislocation. The bones are mildly osteopenic. Mild  osteoarthritic changes of the knee with narrowing of the lateral compartment noted. There is diffuse subcutaneous soft tissue edema of the lower extremity. IMPRESSION: Diffuse soft tissue edema.  No acute fracture or dislocation. Electronically Signed   By: Elgie CollardArash  Radparvar M.D.   On: 05/24/2015 21:19   Medications:  I have reviewed the patient's current medications Scheduled Meds: . amLODipine  5 mg Oral Daily  . clindamycin (CLEOCIN) IV  600 mg Intravenous 3 times per day  . enoxaparin (LOVENOX) injection  30 mg Subcutaneous Q24H  . ferrous sulfate  325 mg Oral Q breakfast  . gabapentin  100 mg Oral TID  . gatifloxacin  1 drop Left Eye QID  . losartan  100 mg Oral Daily  . metoprolol tartrate  25 mg Oral BID  . midodrine  2.5 mg Oral BID WC  . Tdap  0.5 mL Intramuscular Once  . timolol  1 drop Right Eye BID   Continuous Infusions:  PRN Meds:.albuterol   Assessment/Plan: #1. Right lower extremity cellulitis. White count 13.2. X-ray reveals soft tissue edema only. She has been started on intravenous clindamycin which will be continued. 2 lactic acid levels are normal. Glucose is 1:15. She denies a history of diabetes. BUN and creatinine are 34 and 1.08. Principal Problem:   Cellulitis of right lower leg Active Problems:   H/O: stroke with residual effects   Hypertension   Glaucoma   Cellulitis       Chiyeko Ferre 05/25/2015, 9:28 AM

## 2015-05-26 NOTE — Progress Notes (Signed)
Subjective: She is back at her baseline mental status. She is awake and alert. No new complaints.  Objective: Vital signs in last 24 hours: Temp:  [97.6 F (36.4 C)-99.4 F (37.4 C)] 99.4 F (37.4 C) (03/05 2122) Pulse Rate:  [79-81] 79 (03/05 2122) Resp:  [20] 20 (03/05 2122) BP: (113-139)/(40-79) 113/40 mmHg (03/05 2122) SpO2:  [95 %-96 %] 96 % (03/05 2122) Weight change:  Last BM Date: 05/25/15  Intake/Output from previous day: 03/05 0701 - 03/06 0700 In: 840 [P.O.:840] Out: 200 [Urine:200]  PHYSICAL EXAM General appearance: alert, cooperative, mild distress and Her speech is very difficult to understand Resp: clear to auscultation bilaterally Cardio: regular rate and rhythm, S1, S2 normal, no murmur, click, rub or gallop GI: soft, non-tender; bowel sounds normal; no masses,  no organomegaly Extremities: She has cellulitis of the right greater than left leg. She has a wound on her right leg  Lab Results:  Results for orders placed or performed during the hospital encounter of 05/24/15 (from the past 48 hour(s))  Basic metabolic panel     Status: Abnormal   Collection Time: 05/24/15  7:58 PM  Result Value Ref Range   Sodium 141 135 - 145 mmol/L   Potassium 3.6 3.5 - 5.1 mmol/L   Chloride 103 101 - 111 mmol/L   CO2 29 22 - 32 mmol/L   Glucose, Bld 115 (H) 65 - 99 mg/dL   BUN 34 (H) 6 - 20 mg/dL   Creatinine, Ser 1.08 (H) 0.44 - 1.00 mg/dL   Calcium 9.2 8.9 - 10.3 mg/dL   GFR calc non Af Amer 43 (L) >60 mL/min   GFR calc Af Amer 50 (L) >60 mL/min    Comment: (NOTE) The eGFR has been calculated using the CKD EPI equation. This calculation has not been validated in all clinical situations. eGFR's persistently <60 mL/min signify possible Chronic Kidney Disease.    Anion gap 9 5 - 15  Lactic acid, plasma     Status: None   Collection Time: 05/24/15  7:58 PM  Result Value Ref Range   Lactic Acid, Venous 1.1 0.5 - 2.0 mmol/L  CBC with Differential     Status: Abnormal    Collection Time: 05/24/15  7:58 PM  Result Value Ref Range   WBC 13.2 (H) 4.0 - 10.5 K/uL   RBC 4.12 3.87 - 5.11 MIL/uL   Hemoglobin 12.8 12.0 - 15.0 g/dL   HCT 39.1 36.0 - 46.0 %   MCV 94.9 78.0 - 100.0 fL   MCH 31.1 26.0 - 34.0 pg   MCHC 32.7 30.0 - 36.0 g/dL   RDW 15.3 11.5 - 15.5 %   Platelets 414 (H) 150 - 400 K/uL   Neutrophils Relative % 70 %   Neutro Abs 9.1 (H) 1.7 - 7.7 K/uL   Lymphocytes Relative 20 %   Lymphs Abs 2.7 0.7 - 4.0 K/uL   Monocytes Relative 9 %   Monocytes Absolute 1.2 (H) 0.1 - 1.0 K/uL   Eosinophils Relative 1 %   Eosinophils Absolute 0.1 0.0 - 0.7 K/uL   Basophils Relative 0 %   Basophils Absolute 0.1 0.0 - 0.1 K/uL  Lactic acid, plasma     Status: None   Collection Time: 05/24/15 10:36 PM  Result Value Ref Range   Lactic Acid, Venous 0.8 0.5 - 2.0 mmol/L    ABGS No results for input(s): PHART, PO2ART, TCO2, HCO3 in the last 72 hours.  Invalid input(s): PCO2 CULTURES No results found  for this or any previous visit (from the past 240 hour(s)). Studies/Results: Dg Tibia/fibula Right  05/24/2015  CLINICAL DATA:  82 year old female with fall and wound to the anterior mid tibia/fibula. EXAM: RIGHT TIBIA AND FIBULA - 2 VIEW COMPARISON:  None. FINDINGS: There is no acute fracture or dislocation. The bones are mildly osteopenic. Mild osteoarthritic changes of the knee with narrowing of the lateral compartment noted. There is diffuse subcutaneous soft tissue edema of the lower extremity. IMPRESSION: Diffuse soft tissue edema.  No acute fracture or dislocation. Electronically Signed   By: Anner Crete M.D.   On: 05/24/2015 21:19    Medications:  Prior to Admission:  Prescriptions prior to admission  Medication Sig Dispense Refill Last Dose  . amLODipine (NORVASC) 5 MG tablet Take 5 mg by mouth daily.   05/24/2015 at Unknown time  . ferrous sulfate (FERROUSUL) 325 (65 FE) MG tablet Take 1 tablet (325 mg total) by mouth daily with breakfast. 30 tablet 3  05/24/2015 at Unknown time  . furosemide (LASIX) 40 MG tablet Take 40 mg by mouth daily as needed for fluid.    Past Week at Unknown time  . gabapentin (NEURONTIN) 100 MG capsule Take 1 capsule (100 mg total) by mouth 3 (three) times daily. 90 capsule 5 05/24/2015 at Unknown time  . metoprolol tartrate (LOPRESSOR) 25 MG tablet Take 25 mg by mouth 2 (two) times daily.     05/24/2015 at Unknown time  . moxifloxacin (VIGAMOX) 0.5 % ophthalmic solution Place 1 drop into the left eye 2 (two) times daily.    05/24/2015 at Unknown time  . PROAIR HFA 108 (90 BASE) MCG/ACT inhaler Inhale 2 puffs into the lungs every 6 (six) hours as needed for wheezing or shortness of breath.    05/24/2015 at Unknown time  . timolol (TIMOPTIC) 0.25 % ophthalmic solution Place 1 drop into the right eye 2 (two) times daily.    05/24/2015 at Unknown time  . losartan (COZAAR) 100 MG tablet Take 100 mg by mouth daily.   06/22/2014 at Unknown time   Scheduled: . amLODipine  5 mg Oral Daily  . clindamycin (CLEOCIN) IV  600 mg Intravenous 3 times per day  . enoxaparin (LOVENOX) injection  30 mg Subcutaneous Q24H  . ferrous sulfate  325 mg Oral Q breakfast  . gabapentin  100 mg Oral TID  . gatifloxacin  1 drop Left Eye QID  . losartan  100 mg Oral Daily  . metoprolol tartrate  25 mg Oral BID  . midodrine  2.5 mg Oral BID WC  . timolol  1 drop Right Eye BID   Continuous:  [id number], LORazepam, LORazepam  Assesment: She has cellulitis of the right lower leg. She has a history of a previous stroke which has left her with very difficult to understand speech. She has hypertension which is well controlled. She has toxic metabolic encephalopathy and she's better this morning. She has had trouble with orthostatic hypotension and that seems stable Principal Problem:   Cellulitis of right lower leg Active Problems:   H/O: stroke with residual effects   Hypertension   Glaucoma   Cellulitis    Plan: Continue current treatments. Ativan as  needed.    LOS: 1 day   Leonila Speranza L 05/26/2015, 8:11 AM

## 2015-05-27 NOTE — Evaluation (Signed)
Physical Therapy Evaluation Patient Details Name: Carla Bonilla MRN: [id number] DOB: 1920-08-17 Today's Date: 05/27/2015   History of Present Illness  82yo female sustained a fall at home and laceration to RLE, come to [facility] on 3/4 with worseing wound on RLE. 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 Medical record is positive for frequent falls in home. Pt admitted with RLE cellulitis.   Clinical Impression  At evaluation, pt is received semirecumbent in bed upon entry, no caregiver present. The pt is awake and agreeable to participate. No acute distress noted at this time. The pt is alert and oriented x3, pleasant, conversational, and following simple and multi-step commands consistently.Chronic speech apraxia makes comprehension difficult 75% of time, but pt is joyful and verbose, and repeats herself as needed to help with history taking. Pt reports she performs her old exercises each day.  Pt reports falls history at home, demonstrating in ability to acquire balance in standing with physical assistance. Pt grip strength is strong and symmetrical; global strength as screened during functional mobility assessment presents with mild impairment, the pt now requiring some physical assistance for bed mobility and transfers, whereas the patient performs these indep at baseline. Patient presenting with impairment of strength, range of motion, balance, and activity tolerance, limiting ability to perform ADL and mobility tasks at  baseline level of function. Patient will benefit from skilled intervention to address the above impairments and limitations, in order to restore to prior level of function, improve patient safety upon discharge, and to decrease falls risk.       Follow Up Recommendations SNF    Equipment Recommendations  Other (comment)    Recommendations for Other Services       Precautions / Restrictions  Precautions Precautions: None Restrictions Weight Bearing Restrictions: No      Mobility  Bed Mobility Overal bed mobility: Needs Assistance Bed Mobility: Supine to Sit;Sit to Supine     Supine to sit: Mod assist Sit to supine: Mod assist   General bed mobility comments: reports is more difficult than usual.   Transfers Overall transfer level: Needs assistance Equipment used: 1 person hand held assist Transfers: Sit to/from Stand Sit to Stand: Max assist            Ambulation/Gait Ambulation/Gait assistance:  (not attempted; does not ambulate. )              Stairs            Wheelchair Mobility    Modified Rankin (Stroke Patients Only)       Balance Overall balance assessment: History of Falls;Needs assistance Sitting-balance support: Feet supported;No upper extremity supported Sitting balance-Leahy Scale: Fair     Standing balance support: Bilateral upper extremity supported Standing balance-Leahy Scale: Zero                               Pertinent Vitals/Pain Pain Assessment: No/denies pain    Home Living Family/patient expects to be discharged to:: Skilled nursing facility Living Arrangements: Alone               Additional Comments: Has 2 aides daily, 8a-1p, and 5p-9p, with intermittent help from neighbors on the weekend.     Prior Function Level of Independence: Needs assistance   Gait / Transfers Assistance Needed: pt transfers independently and is not ambulatory; more difficulty lately.   ADL's / Homemaking  Assistance Needed: full assist with all ADLs        Hand Dominance   Dominant Hand: Left    Extremity/Trunk Assessment                         Communication   Communication: Expressive difficulties  Cognition Arousal/Alertness: Awake/alert Behavior During Therapy: WFL for tasks assessed/performed Overall Cognitive Status: Within Functional Limits for tasks assessed                       General Comments      Exercises        Assessment/Plan    PT Assessment Patient needs continued PT services  PT Diagnosis Generalized weakness   PT Problem List Decreased strength;Decreased mobility;Decreased range of motion;Decreased activity tolerance;Decreased balance;Decreased coordination  PT Treatment Interventions DME instruction;Therapeutic exercise;Stair training;Functional mobility training;Therapeutic activities;Patient/family education   PT Goals (Current goals can be found in the Care Plan section) Acute Rehab PT Goals Patient Stated Goal: improve functional indep with transfers prior to return to home  PT Goal Formulation: With patient Time For Goal Achievement: 06/10/15 Potential to Achieve Goals: Good    Frequency Min 3X/week   Barriers to discharge Decreased caregiver support      Co-evaluation               End of Session Equipment Utilized During Treatment: Gait belt Activity Tolerance: Patient tolerated treatment well;Patient limited by fatigue Patient left: in bed;with call bell/phone within reach;with bed alarm set Nurse Communication: Other (comment)         Time: 1610-9604: 1501-1529 PT Time Calculation (min) (ACUTE ONLY): 28 min   Charges:   PT Evaluation $PT Eval High Complexity: 1 Procedure PT Treatments $Therapeutic Activity: 8-22 mins   PT G Codes:        10:29 PM, 05/27/2015 Rosamaria LintsAllan C Saesha Llerenas, PT, DPT PRN Physical Therapist at [facility] License # [id number] [phone] (wireless)  [phone] (mobile)

## 2015-05-27 NOTE — Progress Notes (Signed)
Subjective: She is sleepy this morning but sooner rales is. She has no new complaints. Her leg is still inflamed and warm but better than yesterday  Objective: Vital signs in last 24 hours: Temp:  [97.8 F (36.6 C)-98 F (36.7 C)] 97.8 F (36.6 C) (03/07 0524) Pulse Rate:  [71-82] 71 (03/07 0524) Resp:  [20] 20 (03/07 0524) BP: (128-137)/(55-91) 137/55 mmHg (03/07 0524) SpO2:  [96 %-98 %] 96 % (03/07 0524) Weight change:  Last BM Date: 05/25/15  Intake/Output from previous day: 03/06 0701 - 03/07 0700 In: 480 [P.O.:480] Out: -   PHYSICAL EXAM General appearance: alert, cooperative and mild distress Resp: clear to auscultation bilaterally Cardio: regular rate and rhythm, S1, S2 normal, no murmur, click, rub or gallop GI: soft, non-tender; bowel sounds normal; no masses,  no organomegaly Extremities: Her leg is still warm but looks better  Lab Results:  No results found for this or any previous visit (from the past 48 hour(s)).  ABGS No results for input(s): PHART, PO2ART, TCO2, HCO3 in the last 72 hours.  Invalid input(s): PCO2 CULTURES No results found for this or any previous visit (from the past 240 hour(s)). Studies/Results: No results found.  Medications:  Prior to Admission:  Prescriptions prior to admission  Medication Sig Dispense Refill Last Dose  . amLODipine (NORVASC) 5 MG tablet Take 5 mg by mouth daily.   05/24/2015 at Unknown time  . ferrous sulfate (FERROUSUL) 325 (65 FE) MG tablet Take 1 tablet (325 mg total) by mouth daily with breakfast. 30 tablet 3 05/24/2015 at Unknown time  . furosemide (LASIX) 40 MG tablet Take 40 mg by mouth daily as needed for fluid.    Past Week at Unknown time  . gabapentin (NEURONTIN) 100 MG capsule Take 1 capsule (100 mg total) by mouth 3 (three) times daily. 90 capsule 5 05/24/2015 at Unknown time  . losartan (COZAAR) 100 MG tablet Take 100 mg by mouth daily.   05/26/2015 at Unknown time  . metoprolol tartrate (LOPRESSOR) 25 MG  tablet Take 25 mg by mouth 2 (two) times daily.     05/24/2015 at Unknown time  . moxifloxacin (VIGAMOX) 0.5 % ophthalmic solution Place 1 drop into the left eye 2 (two) times daily.    05/24/2015 at Unknown time  . PROAIR HFA 108 (90 BASE) MCG/ACT inhaler Inhale 2 puffs into the lungs every 6 (six) hours as needed for wheezing or shortness of breath.    05/24/2015 at Unknown time  . timolol (TIMOPTIC) 0.25 % ophthalmic solution Place 1 drop into the right eye 2 (two) times daily.    05/24/2015 at Unknown time   Scheduled: . amLODipine  5 mg Oral Daily  . clindamycin (CLEOCIN) IV  600 mg Intravenous 3 times per day  . enoxaparin (LOVENOX) injection  30 mg Subcutaneous Q24H  . ferrous sulfate  325 mg Oral Q breakfast  . gabapentin  100 mg Oral TID  . gatifloxacin  1 drop Left Eye QID  . losartan  100 mg Oral Daily  . metoprolol tartrate  25 mg Oral BID  . midodrine  2.5 mg Oral BID WC  . timolol  1 drop Right Eye BID   Continuous:  OZH:YQMVHQION, LORazepam, LORazepam  Assesment: She has cellulitis of her right leg. She has a history of a stroke. She is significantly weaker than she was before. She has failed outpatient treatment for the cellulitis. Principal Problem:   Cellulitis of right lower leg Active Problems:   H/O:  stroke with residual effects   Hypertension   Glaucoma   Cellulitis    Plan: I'm going to see if she is a candidate for skilled care facility. She may need continued IV antibiotics and I will also request physical therapy evaluation. She is listed as full code but wishes DO NOT RESUSCITATE status according to family    LOS: 2 days   Carla Bonilla L 05/27/2015, 8:56 AM

## 2015-05-28 ENCOUNTER — Inpatient Hospital Stay (HOSPITAL_COMMUNITY): Payer: Medicare Other

## 2015-05-28 DIAGNOSIS — B3749 Other urogenital candidiasis: Secondary | ICD-10-CM | POA: Diagnosis present

## 2015-05-28 LAB — CBC WITH DIFFERENTIAL/PLATELET
Basophils Absolute: 0 10*3/uL (ref 0.0–0.1)
Basophils Relative: 0 %
EOS PCT: 4 %
Eosinophils Absolute: 0.3 10*3/uL (ref 0.0–0.7)
HCT: 34.2 % — ABNORMAL LOW (ref 36.0–46.0)
HEMOGLOBIN: 11.2 g/dL — AB (ref 12.0–15.0)
LYMPHS ABS: 1 10*3/uL (ref 0.7–4.0)
LYMPHS PCT: 12 %
MCH: 31.2 pg (ref 26.0–34.0)
MCHC: 32.7 g/dL (ref 30.0–36.0)
MCV: 95.3 fL (ref 78.0–100.0)
Monocytes Absolute: 0.9 10*3/uL (ref 0.1–1.0)
Monocytes Relative: 11 %
NEUTROS PCT: 73 %
Neutro Abs: 5.8 10*3/uL (ref 1.7–7.7)
Platelets: 342 10*3/uL (ref 150–400)
RBC: 3.59 MIL/uL — AB (ref 3.87–5.11)
RDW: 15.2 % (ref 11.5–15.5)
WBC: 8 10*3/uL (ref 4.0–10.5)

## 2015-05-28 LAB — BASIC METABOLIC PANEL
Anion gap: 7 (ref 5–15)
BUN: 20 mg/dL (ref 6–20)
CHLORIDE: 104 mmol/L (ref 101–111)
CO2: 28 mmol/L (ref 22–32)
Calcium: 8.2 mg/dL — ABNORMAL LOW (ref 8.9–10.3)
Creatinine, Ser: 1.15 mg/dL — ABNORMAL HIGH (ref 0.44–1.00)
GFR calc Af Amer: 46 mL/min — ABNORMAL LOW (ref >60)
GFR calc non Af Amer: 40 mL/min — ABNORMAL LOW (ref >60)
GLUCOSE: 100 mg/dL — AB (ref 65–99)
POTASSIUM: 3.7 mmol/L (ref 3.5–5.1)
Sodium: 139 mmol/L (ref 135–145)

## 2015-05-28 MED ORDER — NYSTATIN 100000 UNIT/GM EX POWD: 1.0000 g | Freq: Two times a day (BID) | CUTANEOUS | Status: DC

## 2015-05-28 MED ORDER — CLINDAMYCIN HCL 300 MG PO CAPS: 300.0000 mg | ORAL_CAPSULE | Freq: Three times a day (TID) | ORAL | Status: DC

## 2015-05-28 MED ORDER — METHYLPREDNISOLONE SODIUM SUCC 40 MG IJ SOLR
40.0000 mg | Freq: Two times a day (BID) | INTRAMUSCULAR | Status: DC
  Administered 2015-05-28 – 2015-05-30 (×4): 40 mg via INTRAVENOUS
  Filled 2015-05-28 (×4): qty 1

## 2015-05-28 MED ORDER — LORAZEPAM 0.5 MG PO TABS: 0.5000 mg | ORAL_TABLET | ORAL | Status: DC | PRN

## 2015-05-28 MED ORDER — MIDODRINE HCL 2.5 MG PO TABS: 2.5000 mg | ORAL_TABLET | Freq: Two times a day (BID) | ORAL | Status: DC

## 2015-05-28 MED ORDER — RESOURCE THICKENUP CLEAR PO POWD
ORAL | Status: DC | PRN
  Filled 2015-05-28: qty 125

## 2015-05-28 MED ORDER — NYSTATIN 100000 UNIT/GM EX POWD
Freq: Two times a day (BID) | CUTANEOUS | Status: DC
  Administered 2015-05-28 – 2015-05-30 (×5): via TOPICAL
  Filled 2015-05-28 (×3): qty 15

## 2015-05-28 NOTE — Care Management Important Message (Signed)
Important Message  Patient Details  Name: Carla Bonilla MRN: [id number] Date of Birth: 1920/10/21   Medicare Important Message Given:  Yes    Adonis HugueninBerkhead, Shyra Emile L, RN 05/28/2015, 8:47 AM

## 2015-05-28 NOTE — Evaluation (Addendum)
Clinical/Bedside Swallow Evaluation Patient Details  Name: Carla Bonilla MRN: [id number] Date of Birth: [birth year]  Today's Date: 05/28/2015 Time: SLP Start Time (ACUTE ONLY): 1450 SLP Stop Time (ACUTE ONLY): 1518 SLP Time Calculation (min) (ACUTE ONLY): 28 min  Past Medical History:  Past Medical History  Diagnosis Date  . Hypertension   . Blind left eye   . Glaucoma   . Hearing loss   . CVA (cerebral vascular accident) [place])     speech problem   Past Surgical History:  Past Surgical History  Procedure Laterality Date  . Shoulder surgery      RIGHT shoulder open treatment internal fixation with locking plate. Date of surgery November 25, 2010  . Orif humerus fracture  11/25/2010    Procedure: OPEN REDUCTION INTERNAL FIXATION (ORIF) PROXIMAL HUMERUS FRACTURE;  Surgeon: Fuller Canada, MD;  Location: [facility];  Service: Orthopedics;  Laterality: Right;  . Eye surgery      left   HPI:  This is a 82 year old who was in her usual state of poor health at home when she developed inflammation swelling and pain in her right lower leg. She came to the emergency department and was found to have cellulitis. She was started on IV antibiotics. She improved over the next several days but still has inflammation in her leg. She had PT consultation because of weakness and it has been recommended that she go to skilled care facility for rehabilitation.   Chest x-ray 05/28/3015: FINDINGS: The heart size and mediastinal contours are within normal limits. Both lungs are clear. No pneumothorax or pleural effusion is noted. Status post surgical internal fixation of proximal humeral fracture.  IMPRESSION: No acute cardiopulmonary abnormality seen.  Assessment / Plan / Recommendation Clinical Impression  Pt seen for clinical swallow evaluation this date. Pt known to this SLP from previous stroke in 2009. Pt presenting with expiratory wheeze when [facility] raised, however pt denies difficulty breathing. Pt  with verbal and oral apraxia from previous stroke and has great difficulty with volitional lingual movement, however improves with functional task (eating). Pt with delayed cough after liquid trials. Chest x-ray clear today, however expiratory wheeze and seemingly increased work of breath is concerning in setting of previous CVA, advanced age, and current bed-bound status. Recommend downgrading diet to D3/mech soft and nectar-thick liquids with MBSS tomorrow.    Aspiration Risk  Mild aspiration risk    Diet Recommendation Dysphagia 3 (Mech soft);Nectar-thick liquid   Liquid Administration via: Cup;Straw Medication Administration: Whole meds with puree Supervision: Full supervision/cueing for compensatory strategies;Staff to assist with self feeding Compensations: Slow rate;Small sips/bites;Multiple dry swallows after each bite/sip Postural Changes: Seated upright at 90 degrees;Remain upright for at least 30 minutes after po intake    Other  Recommendations Oral Care Recommendations: Oral care BID;Staff/trained caregiver to provide oral care Other Recommendations: Order thickener from pharmacy;Clarify dietary restrictions   Follow up Recommendations  Skilled Nursing facility    Frequency and Duration min 2x/week  1 week       Prognosis Prognosis for Safe Diet Advancement: Fair Barriers to Reach Goals: Severity of deficits      Swallow Study   General Date of Onset: 05/24/15 HPI: This is a 82 year old who was in her usual state of poor health at home when she developed inflammation swelling and pain in her right lower leg. She came to the emergency department and was found to have cellulitis. She was started on IV antibiotics. She improved over the next several days  but still has inflammation in her leg. She had PT consultation because of weakness and it has been recommended that she go to skilled care facility for rehabilitation. Type of Study: Bedside Swallow Evaluation Previous  Swallow Assessment: 2009 no records found Diet Prior to this Study: Regular;Thin liquids Temperature Spikes Noted: Yes Respiratory Status: Room air History of Recent Intubation: No Behavior/Cognition: Alert;Confused;Cooperative Oral Cavity Assessment: Within Functional Limits (mildly coated tongue) Oral Care Completed by SLP: Yes Oral Cavity - Dentition:  (partials) Vision: Impaired for self-feeding (needs assist) Self-Feeding Abilities: Needs assist Patient Positioning: Upright in bed (increased WOB when elevated) Baseline Vocal Quality: Normal;Low vocal intensity Volitional Cough: Strong;Congested Volitional Swallow: Able to elicit    Oral/Motor/Sensory Function Overall Oral Motor/Sensory Function: Mild impairment Facial ROM: Within Functional Limits Facial Symmetry: Within Functional Limits Facial Strength: Within Functional Limits Facial Sensation: Within Functional Limits Lingual ROM: Reduced right;Reduced left;Suspected CN XII (hypoglossal) dysfunction Lingual Symmetry: Within Functional Limits Lingual Strength: Reduced Lingual Sensation: Within Functional Limits Velum: Within Functional Limits Mandible: Within Functional Limits   Ice Chips Ice chips: Within functional limits Presentation: Spoon   Thin Liquid Thin Liquid: Impaired Presentation: Straw;Cup;Spoon Oral Phase Impairments: Reduced lingual movement/coordination Pharyngeal  Phase Impairments: Cough - Delayed    Nectar Thick Nectar Thick Liquid: Impaired Presentation: Spoon Oral Phase Impairments: Reduced lingual movement/coordination Pharyngeal Phase Impairments: Cough - Delayed   Honey Thick Honey Thick Liquid: Not tested   Puree Puree: Within functional limits Presentation: Spoon   Solid     Thank you,  Havery MorosDabney Porter, CCC-SLP [phone]  Solid: Impaired Presentation: Spoon;Self Fed Oral Phase Impairments: Reduced lingual movement/coordination Oral Phase Functional Implications: Prolonged oral  transit Pharyngeal Phase Impairments: Cough - Delayed        PORTER,DABNEY 05/28/2015,3:21 PM

## 2015-05-28 NOTE — NC FL2 (Signed)
[place] MEDICAID FL2 LEVEL OF CARE SCREENING TOOL     IDENTIFICATION  Patient Name: Carla Bonilla Birthdate: 1920-05-25 Sex: female Admission Date (Current Location): 05/24/2015  [facility] [place]   Facility and Address:  [facility],  [address], [place] [id number]      Provider Number: [id number]  Attending Physician Name and Address:  Kari BaarsEdward Hawkins, MD  Relative Name and Phone Number:       Current Level of Care: Hospital Recommended Level of Care: Skilled Nursing Facility Prior Approval Number:    Date Approved/Denied:   PASRR Number:  [id number])  Discharge Plan: SNF    Current Diagnoses: Patient Active Problem List   Diagnosis Date Noted  . Candida infection of genital region 05/28/2015  . Cellulitis of right lower leg 05/24/2015  . Cellulitis 05/24/2015  . GI bleeding 06/27/2014  . Heme positive stool   . Acute blood loss anemia   . Syncope and collapse 06/23/2014  . Laceration 06/23/2014  . Syncope 10/07/2013  . Spinal stenosis of lumbar region with radiculopathy 01/06/2012  . Sciatica neuralgia 01/06/2012  . Proximal humerus fracture 03/30/2011  . S/P shoulder surgery 12/08/2010  . Fracture, shoulder 12/08/2010  . Fracture of humerus, distal, right, closed 11/24/2010  . H/O: stroke with residual effects 11/24/2010  . Hypertension 11/24/2010  . Glaucoma 11/24/2010    Orientation RESPIRATION BLADDER Height & Weight     Self, Time, Situation, Place  Normal Continent Weight: 135 lb 12.9 oz (61.6 kg) Height:  5\' 1"  (154.9 cm)  BEHAVIORAL SYMPTOMS/MOOD NEUROLOGICAL BOWEL NUTRITION STATUS   (none )  (none ) Continent Diet (Diet: Heart Healthy )  AMBULATORY STATUS COMMUNICATION OF NEEDS Skin   Extensive Assist Verbally Other (Comment) (Celuliites, Skin Tear bilateral arms, )                       Personal Care Assistance Level of Assistance  Bathing, Feeding, Dressing Bathing Assistance: Limited  assistance Feeding assistance: Independent Dressing Assistance: Limited assistance     Functional Limitations Info  Sight, Hearing, Speech Sight Info: Impaired Hearing Info: Adequate Speech Info: Adequate    SPECIAL CARE FACTORS FREQUENCY  PT (By licensed PT), OT (By licensed OT)     PT Frequency:  (5) OT Frequency:  (5)            Contractures      Additional Factors Info  Code Status Code Status Info:  (DNR)             Current Medications (05/28/2015):  This is the current hospital active medication list Current Facility-Administered Medications  Medication Dose Route Frequency Provider Last Rate Last Dose  . albuterol (PROVENTIL) (2.5 MG/3ML) 0.083% nebulizer solution 3 mL  3 mL Inhalation Q6H PRN Houston SirenPeter Le, MD      . amLODipine (NORVASC) tablet 5 mg  5 mg Oral Daily Houston SirenPeter Le, MD   5 mg at 05/27/15 1029  . clindamycin (CLEOCIN) IVPB 600 mg  600 mg Intravenous 3 times per day Houston SirenPeter Le, MD   600 mg at 05/28/15 0538  . enoxaparin (LOVENOX) injection 30 mg  30 mg Subcutaneous Q24H Houston SirenPeter Le, MD   30 mg at 05/27/15 2155  . ferrous sulfate tablet 325 mg  325 mg Oral Q breakfast Houston SirenPeter Le, MD   325 mg at 05/27/15 1029  . gabapentin (NEURONTIN) capsule 100 mg  100 mg Oral TID Houston SirenPeter Le, MD   100 mg  at 05/27/15 2152  . gatifloxacin (ZYMAXID) 0.5 % ophthalmic drops 1 drop  1 drop Left Eye QID Houston Siren, MD   1 drop at 05/27/15 2153  . LORazepam (ATIVAN) injection 0.5 mg  0.5 mg Intravenous Q4H PRN Carylon Perches, MD   0.5 mg at 05/28/15 0327  . LORazepam (ATIVAN) tablet 0.5 mg  0.5 mg Oral Q4H PRN Carylon Perches, MD   0.5 mg at 05/27/15 2347  . losartan (COZAAR) tablet 100 mg  100 mg Oral Daily Houston Siren, MD   100 mg at 05/27/15 1029  . metoprolol tartrate (LOPRESSOR) tablet 25 mg  25 mg Oral BID Houston Siren, MD   25 mg at 05/27/15 2152  . midodrine (PROAMATINE) tablet 2.5 mg  2.5 mg Oral BID WC Houston Siren, MD   2.5 mg at 05/27/15 1732  . nystatin (MYCOSTATIN/NYSTOP) topical powder   Topical BID  Kari Baars, MD      . timolol (TIMOPTIC) 0.25 % ophthalmic solution 1 drop  1 drop Right Eye BID Houston Siren, MD   1 drop at 05/27/15 2153     Discharge Medications: Please see discharge summary for a list of discharge medications.  Relevant Imaging Results:  Relevant Lab Results:   Additional Information  (SSN: [id number])  Haig Prophet, LCSW

## 2015-05-28 NOTE — Clinical Social Work Placement (Signed)
   CLINICAL SOCIAL WORK PLACEMENT  NOTE  Date:  05/28/2015  Patient Details  Name: Carla Bonilla MRN: [id number] Date of Birth: 31-Mar-1920  Clinical Social Work is seeking post-discharge placement for this patient at the Skilled  Nursing Facility level of care (*CSW will initial, date and re-position this form in  chart as items are completed):  Yes   Patient/family provided with [place] Clinical Social Work Department's list of facilities offering this level of care within the geographic area requested by the patient (or if unable, by the patient's family).  Yes   Patient/family informed of their freedom to choose among providers that offer the needed level of care, that participate in Medicare, Medicaid or managed care program needed by the patient, have an available bed and are willing to accept the patient.  Yes   Patient/family informed of 's ownership interest in [facility] and [facility], as well as of the fact that they are under no obligation to receive care at these facilities.  PASRR submitted to EDS on       PASRR number received on       Existing PASRR number confirmed on 05/28/15 (Patient has an existing PASARR (patient's D.O.B in [place] is 11/17/1920))     FL2 transmitted to all facilities in geographic area requested by pt/family on 05/28/15     FL2 transmitted to all facilities within larger geographic area on       Patient informed that his/her managed care company has contracts with or will negotiate with certain facilities, including the following:        Yes   Patient/family informed of bed offers received.  Patient [facility]  [facility]     Physician recommends and patient chooses bed at      Patient to be transferred to   on  .  Patient to be transferred to facility by       Patient family notified on   of transfer.  Name of family member notified:        PHYSICIAN       Additional Comment:     _______________________________________________ Haig ProphetMorgan, Diasia Henken G, LCSW 05/28/2015, 11:51 AM

## 2015-05-28 NOTE — Discharge Summary (Addendum)
Physician Discharge Summary  Patient ID: Carla Bonilla MRN: [id number] DOB/AGE: [birth year] 82 y.o. Primary Care Physician:Rider Ermis L, MD Admit date: 05/24/2015 Discharge date: 05/28/2015    Discharge Diagnoses:   Principal Problem:   Cellulitis of right lower leg Active Problems:   H/O: stroke with residual effects   Hypertension   Glaucoma   Spinal stenosis of lumbar region with radiculopathy   Cellulitis   Candida infection of genital region     Medication List    TAKE these medications        amLODipine 5 MG tablet  Commonly known as:  NORVASC  Take 5 mg by mouth daily.     clindamycin 300 MG capsule  Commonly known as:  CLEOCIN  Take 1 capsule (300 mg total) by mouth 3 (three) times daily.     ferrous sulfate 325 (65 FE) MG tablet  Commonly known as:  FERROUSUL  Take 1 tablet (325 mg total) by mouth daily with breakfast.     furosemide 40 MG tablet  Commonly known as:  LASIX  Take 40 mg by mouth daily as needed for fluid.     gabapentin 100 MG capsule  Commonly known as:  NEURONTIN  Take 1 capsule (100 mg total) by mouth 3 (three) times daily.     LORazepam 0.5 MG tablet  Commonly known as:  ATIVAN  Take 1 tablet (0.5 mg total) by mouth every 4 (four) hours as needed for anxiety.     losartan 100 MG tablet  Commonly known as:  COZAAR  Take 100 mg by mouth daily.     metoprolol tartrate 25 MG tablet  Commonly known as:  LOPRESSOR  Take 25 mg by mouth 2 (two) times daily.     midodrine 2.5 MG tablet  Commonly known as:  PROAMATINE  Take 1 tablet (2.5 mg total) by mouth 2 (two) times daily with a meal.     moxifloxacin 0.5 % ophthalmic solution  Commonly known as:  VIGAMOX  Place 1 drop into the left eye 2 (two) times daily.     nystatin 100000 UNIT/GM Powd  Apply 1 g topically 2 (two) times daily.     PROAIR HFA 108 (90 Base) MCG/ACT inhaler  Generic drug:  albuterol  Inhale 2 puffs into the lungs every 6 (six) hours as needed for  wheezing or shortness of breath.     timolol 0.25 % ophthalmic solution  Commonly known as:  TIMOPTIC  Place 1 drop into the right eye 2 (two) times daily.        Discharged Condition: Improved    Consults: None  Significant Diagnostic Studies: Dg Tibia/fibula Right  05/24/2015  CLINICAL DATA:  82 year old female with fall and wound to the anterior mid tibia/fibula. EXAM: RIGHT TIBIA AND FIBULA - 2 VIEW COMPARISON:  None. FINDINGS: There is no acute fracture or dislocation. The bones are mildly osteopenic. Mild osteoarthritic changes of the knee with narrowing of the lateral compartment noted. There is diffuse subcutaneous soft tissue edema of the lower extremity. IMPRESSION: Diffuse soft tissue edema.  No acute fracture or dislocation. Electronically Signed   By: Elgie Collard M.D.   On: 05/24/2015 21:19    Lab Results: Basic Metabolic Panel:  Recent Labs  40/10/27 0614  NA 139  K 3.7  CL 104  CO2 28  GLUCOSE 100*  BUN 20  CREATININE 1.15*  CALCIUM 8.2*   Liver Function Tests: No results for input(s): AST, ALT, ALKPHOS, BILITOT, PROT, ALBUMIN in  the last 72 hours.   CBC:  Recent Labs  05/28/15 0614  WBC 8.0  NEUTROABS 5.8  HGB 11.2*  HCT 34.2*  MCV 95.3  PLT 342    No results found for this or any previous visit (from the past 240 hour(s)).   Hospital Course: This is a 82 year old who was in her usual state of poor health at home when she developed inflammation swelling and pain in her right lower leg. She came to the emergency department and was found to have cellulitis. She was started on IV antibiotics. She improved over the next several days but still has inflammation in her leg. She had PT consultation because of weakness and it has been recommended that she go to skilled care facility for rehabilitation. She was being set up for discharge but appeared to aspirate so she stayed in the hospital for another 48 hours. On the day of discharge her chest is  clear and she is no longer coughing. She had speech evaluation while she was in the hospital as well.  Discharge Exam: Blood pressure 126/54, pulse 81, temperature 98.5 F (36.9 C), temperature source Oral, resp. rate 20, height 5\' 1"  (1.549 m), weight 61.6 kg (135 lb 12.9 oz), SpO2 93 %. She has significant speech abnormality resulting from strokes in the past. Her right leg shows inflammation erythema and is still somewhat warm but much better. Her chest is clear.  Disposition: To skilled care facility for rehabilitation. She will be on clindamycin orally for 7 more days and reassessment at that time. She will need physical therapy occupational therapy speech therapy. She will be on dysphagia 3 diet with nectar thick liquids      Discharge Instructions    Discharge to SNF when bed available    Complete by:  As directed              Signed: Albeiro Trompeter L   05/28/2015, 8:51 AM

## 2015-05-28 NOTE — Progress Notes (Signed)
Subjective: She seems to be improving. She has no new complaints. Her leg is better but not clear. She is still very weak  Objective: Vital signs in last 24 hours: Temp:  [98.1 F (36.7 C)-98.9 F (37.2 C)] 98.5 F (36.9 C) (03/08 0545) Pulse Rate:  [77-81] 81 (03/08 0545) Resp:  [20] 20 (03/08 0545) BP: (120-126)/(45-54) 126/54 mmHg (03/08 0545) SpO2:  [93 %-96 %] 93 % (03/08 0545) Weight change:  Last BM Date: 05/27/15  Intake/Output from previous day: 03/07 0701 - 03/08 0700 In: 480 [P.O.:480] Out: -   PHYSICAL EXAM General appearance: alert, cooperative and mild distress Resp: clear to auscultation bilaterally Cardio: regular rate and rhythm, S1, S2 normal, no murmur, click, rub or gallop GI: soft, non-tender; bowel sounds normal; no masses,  no organomegaly Extremities: She still has some erythema and the wound on her leg but it is improving  Lab Results:  Results for orders placed or performed during the hospital encounter of 05/24/15 (from the past 48 hour(s))  CBC with Differential/Platelet     Status: Abnormal   Collection Time: 05/28/15  6:14 AM  Result Value Ref Range   WBC 8.0 4.0 - 10.5 K/uL   RBC 3.59 (L) 3.87 - 5.11 MIL/uL   Hemoglobin 11.2 (L) 12.0 - 15.0 g/dL   HCT 34.2 (L) 36.0 - 46.0 %   MCV 95.3 78.0 - 100.0 fL   MCH 31.2 26.0 - 34.0 pg   MCHC 32.7 30.0 - 36.0 g/dL   RDW 15.2 11.5 - 15.5 %   Platelets 342 150 - 400 K/uL   Neutrophils Relative % 73 %   Neutro Abs 5.8 1.7 - 7.7 K/uL   Lymphocytes Relative 12 %   Lymphs Abs 1.0 0.7 - 4.0 K/uL   Monocytes Relative 11 %   Monocytes Absolute 0.9 0.1 - 1.0 K/uL   Eosinophils Relative 4 %   Eosinophils Absolute 0.3 0.0 - 0.7 K/uL   Basophils Relative 0 %   Basophils Absolute 0.0 0.0 - 0.1 K/uL  Basic metabolic panel     Status: Abnormal   Collection Time: 05/28/15  6:14 AM  Result Value Ref Range   Sodium 139 135 - 145 mmol/L   Potassium 3.7 3.5 - 5.1 mmol/L   Chloride 104 101 - 111 mmol/L   CO2  28 22 - 32 mmol/L   Glucose, Bld 100 (H) 65 - 99 mg/dL   BUN 20 6 - 20 mg/dL   Creatinine, Ser 1.15 (H) 0.44 - 1.00 mg/dL   Calcium 8.2 (L) 8.9 - 10.3 mg/dL   GFR calc non Af Amer 40 (L) >60 mL/min   GFR calc Af Amer 46 (L) >60 mL/min    Comment: (NOTE) The eGFR has been calculated using the CKD EPI equation. This calculation has not been validated in all clinical situations. eGFR's persistently <60 mL/min signify possible Chronic Kidney Disease.    Anion gap 7 5 - 15    ABGS No results for input(s): PHART, PO2ART, TCO2, HCO3 in the last 72 hours.  Invalid input(s): PCO2 CULTURES No results found for this or any previous visit (from the past 240 hour(s)). Studies/Results: No results found.  Medications:  Prior to Admission:  Prescriptions prior to admission  Medication Sig Dispense Refill Last Dose  . amLODipine (NORVASC) 5 MG tablet Take 5 mg by mouth daily.   05/24/2015 at Unknown time  . ferrous sulfate (FERROUSUL) 325 (65 FE) MG tablet Take 1 tablet (325 mg total) by mouth  daily with breakfast. 30 tablet 3 05/24/2015 at Unknown time  . furosemide (LASIX) 40 MG tablet Take 40 mg by mouth daily as needed for fluid.    Past Week at Unknown time  . gabapentin (NEURONTIN) 100 MG capsule Take 1 capsule (100 mg total) by mouth 3 (three) times daily. 90 capsule 5 05/24/2015 at Unknown time  . losartan (COZAAR) 100 MG tablet Take 100 mg by mouth daily.   05/26/2015 at Unknown time  . metoprolol tartrate (LOPRESSOR) 25 MG tablet Take 25 mg by mouth 2 (two) times daily.     05/24/2015 at Unknown time  . moxifloxacin (VIGAMOX) 0.5 % ophthalmic solution Place 1 drop into the left eye 2 (two) times daily.    05/24/2015 at Unknown time  . PROAIR HFA 108 (90 BASE) MCG/ACT inhaler Inhale 2 puffs into the lungs every 6 (six) hours as needed for wheezing or shortness of breath.    05/24/2015 at Unknown time  . timolol (TIMOPTIC) 0.25 % ophthalmic solution Place 1 drop into the right eye 2 (two) times daily.     05/24/2015 at Unknown time   Scheduled: . amLODipine  5 mg Oral Daily  . clindamycin (CLEOCIN) IV  600 mg Intravenous 3 times per day  . enoxaparin (LOVENOX) injection  30 mg Subcutaneous Q24H  . ferrous sulfate  325 mg Oral Q breakfast  . gabapentin  100 mg Oral TID  . gatifloxacin  1 drop Left Eye QID  . losartan  100 mg Oral Daily  . metoprolol tartrate  25 mg Oral BID  . midodrine  2.5 mg Oral BID WC  . nystatin   Topical BID  . timolol  1 drop Right Eye BID   Continuous:  HFS:[id number], LORazepam, LORazepam  Assesment: She has cellulitis of the right lower leg. She has had a severe stroke with residual speech difficulty. She has what looks like a candidal infection in the intertriginous areas in her groin. It has been recommended that she go to skilled care facility for rehabilitation and I think that's appropriate Principal Problem:   Cellulitis of right lower leg Active Problems:   H/O: stroke with residual effects   Hypertension   Glaucoma   Spinal stenosis of lumbar region with radiculopathy   Cellulitis   Candida infection of genital region    Plan: Transfer to skilled care facility when bed available    LOS: 3 days   Rahm Minix L 05/28/2015, 8:49 AM

## 2015-05-28 NOTE — Clinical Social Work Note (Signed)
Clinical Social Work Assessment  Patient Details  Name: Carla Bonilla MRN: [id number] Date of Birth: [birth year]  Date of referral:  05/28/15               Reason for consult:  Facility Placement                Permission sought to share information with:  Chartered certified accountant granted to share information::  Yes, Verbal Permission Granted  Name::       [facility]::   [facility]   Relationship::     Contact Information:     Housing/Transportation Living arrangements for the past 2 months:  [place] of Information:  Patient, Power of Attorney Patient Interpreter Needed:  None Criminal Activity/Legal Involvement Pertinent to Current Situation/Hospitalization:  No - Comment as needed Significant Relationships:  Other Family Members (Patient's niece and nephew are HPOA ) Lives with:  Self Do you feel safe going back to the place where you live?  Yes Need for family participation in patient care:  Yes (Comment)  Care giving concerns:  Patient lives in [place] alone.    Social Worker assessment / plan:  Holiday representative (CSW) received verbal SNF consult from RN Case Manager that patient need SNF and is ready for D/C today. CSW met with patient and her niece Carla Bonilla [phone] and caregiver Carla Bonilla [phone] were at bedside. Per niece she and her brother are patient's HPOA and are from [place] and are planning on driving back this evening. Per niece patient lives alone in [place] and her caregiver Carla Bonilla checks on her daily and lives locally. CSW explained that PT is recommending SNF. Patient is agreeable to SNF search and prefers [facility]. CSW explained that Medicare will pay for days 1-20 at 100% and days 21-100 at 80% if patient has a 3 night inpatient qualifying stay. Patient was admitted to inpatient to [facility] on 05/25/15.    CSW presented bed offers to patient and she chose [facility]. MD was going  to discharge patient today however he cancelled the discharge because of her respiratory status. Patient's niece Carla Bonilla is going to [facility] today to complete admissions paper work before she returns back to [place]. [place] admissions coordinator at [facility] is aware of above.   Per Carla Bonilla patient will go to room 130. RN will call report at [phone].   Employment status:  Retired Forensic scientist:  Medicare PT Recommendations:  [place] / Referral to community resources:  [place]  Patient/Family's Response to care:  Patient is agreeable to go to [facility] for rehab. Patient has been to [facility] before in 2009 and 2013.   Patient/Family's Understanding of and Emotional Response to Diagnosis, Current Treatment, and Prognosis:  Patient and niece were pleasant and thanked CSW for visit.   Emotional Assessment Appearance:  Appears stated age Attitude/Demeanor/Rapport:    Affect (typically observed):  Accepting, Adaptable, Pleasant Orientation:  Oriented to Self, Oriented to Place, Oriented to  Time, Oriented to Situation Alcohol / Substance use:  Not Applicable Psych involvement (Current and /or in the community):  No (Comment)  Discharge Needs  Concerns to be addressed:  Discharge Planning Concerns Readmission within the last 30 days:  No Current discharge risk:  Dependent with Mobility Barriers to Discharge:  Continued Medical Work up   Loralyn Freshwater, LCSW 05/28/2015, 11:55 AM

## 2015-05-28 NOTE — Care Management Note (Signed)
Case Management Note  Patient Details  Name: Carla Bonilla MRN: [id number] Date of Birth: May 30, 1920  Subjective/Objective:                    Action/Plan: Skilled nursing facility.   Expected Discharge Date:                  Expected Discharge Plan:  Skilled Nursing Facility  In-House Referral:  Clinical Social Work  Discharge planning Services  CM Consult  Post Acute Care Choice:    Choice offered to:     DME Arranged:    DME Agency:     HH Arranged:    HH Agency:     Status of Service:  Completed, signed off  Medicare Important Message Given:  Yes Date Medicare IM Given:    Medicare IM give by:    Date Additional Medicare IM Given:    Additional Medicare Important Message give by:     If discussed at Long Length of Stay Meetings, dates discussed:    Additional Comments:  Adonis HugueninBerkhead, Diego Delancey L, RN 05/28/2015, 8:54 AM

## 2015-05-29 ENCOUNTER — Inpatient Hospital Stay (HOSPITAL_COMMUNITY): Payer: Medicare Other

## 2015-05-29 MED ORDER — LORAZEPAM 0.5 MG PO TABS: 0.5000 mg | ORAL_TABLET | Freq: Three times a day (TID) | ORAL | Status: DC | PRN

## 2015-05-29 MED ORDER — LORAZEPAM 2 MG/ML IJ SOLN: 0.5000 mg | Freq: Three times a day (TID) | INTRAMUSCULAR | Status: DC | PRN

## 2015-05-29 NOTE — Progress Notes (Signed)
MBS report now available under the imaging section of the notes  [place] Landgrebe Sumney MA, CCC-SLP Acute Care Speech Language Pathologist    

## 2015-05-29 NOTE — Progress Notes (Signed)
Subjective: She is still coughing and congested. I think she probably aspirated. I don't think she can go to the skilled care facility at this point.  Objective: Vital signs in last 24 hours: Temp:  [97.6 F (36.4 C)-100.2 F (37.9 C)] 97.6 F (36.4 C) (03/09 0402) Pulse Rate:  [71-87] 71 (03/09 0402) Resp:  [20] 20 (03/09 0402) BP: (108-133)/(46-57) 120/57 mmHg (03/09 0402) SpO2:  [91 %-96 %] 96 % (03/09 0402) Weight change:  Last BM Date: 05/27/15  Intake/Output from previous day: 03/08 0701 - 03/09 0700 In: 290 [P.O.:240; IV Piggyback:50] Out: -   PHYSICAL EXAM General appearance: alert, cooperative and mild distress Resp: rhonchi bilaterally Cardio: regular rate and rhythm, S1, S2 normal, no murmur, click, rub or gallop GI: soft, non-tender; bowel sounds normal; no masses,  no organomegaly Extremities: extremities normal, atraumatic, no cyanosis or edema  Lab Results:  Results for orders placed or performed during the hospital encounter of 05/24/15 (from the past 48 hour(s))  CBC with Differential/Platelet     Status: Abnormal   Collection Time: 05/28/15  6:14 AM  Result Value Ref Range   WBC 8.0 4.0 - 10.5 K/uL   RBC 3.59 (L) 3.87 - 5.11 MIL/uL   Hemoglobin 11.2 (L) 12.0 - 15.0 g/dL   HCT 34.2 (L) 36.0 - 46.0 %   MCV 95.3 78.0 - 100.0 fL   MCH 31.2 26.0 - 34.0 pg   MCHC 32.7 30.0 - 36.0 g/dL   RDW 15.2 11.5 - 15.5 %   Platelets 342 150 - 400 K/uL   Neutrophils Relative % 73 %   Neutro Abs 5.8 1.7 - 7.7 K/uL   Lymphocytes Relative 12 %   Lymphs Abs 1.0 0.7 - 4.0 K/uL   Monocytes Relative 11 %   Monocytes Absolute 0.9 0.1 - 1.0 K/uL   Eosinophils Relative 4 %   Eosinophils Absolute 0.3 0.0 - 0.7 K/uL   Basophils Relative 0 %   Basophils Absolute 0.0 0.0 - 0.1 K/uL  Basic metabolic panel     Status: Abnormal   Collection Time: 05/28/15  6:14 AM  Result Value Ref Range   Sodium 139 135 - 145 mmol/L   Potassium 3.7 3.5 - 5.1 mmol/L   Chloride 104 101 - 111  mmol/L   CO2 28 22 - 32 mmol/L   Glucose, Bld 100 (H) 65 - 99 mg/dL   BUN 20 6 - 20 mg/dL   Creatinine, Ser 1.15 (H) 0.44 - 1.00 mg/dL   Calcium 8.2 (L) 8.9 - 10.3 mg/dL   GFR calc non Af Amer 40 (L) >60 mL/min   GFR calc Af Amer 46 (L) >60 mL/min    Comment: (NOTE) The eGFR has been calculated using the CKD EPI equation. This calculation has not been validated in all clinical situations. eGFR's persistently <60 mL/min signify possible Chronic Kidney Disease.    Anion gap 7 5 - 15    ABGS No results for input(s): PHART, PO2ART, TCO2, HCO3 in the last 72 hours.  Invalid input(s): PCO2 CULTURES No results found for this or any previous visit (from the past 240 hour(s)). Studies/Results: Dg Chest 1 View  05/28/2015  CLINICAL DATA:  Shortness of breath. EXAM: CHEST 1 VIEW COMPARISON:  October 07, 2013. FINDINGS: The heart size and mediastinal contours are within normal limits. Both lungs are clear. No pneumothorax or pleural effusion is noted. Status post surgical internal fixation of proximal humeral fracture. IMPRESSION: No acute cardiopulmonary abnormality seen. Electronically Signed  By: Marijo Conception, M.D.   On: 05/28/2015 13:06    Medications:  Prior to Admission:  Prescriptions prior to admission  Medication Sig Dispense Refill Last Dose  . amLODipine (NORVASC) 5 MG tablet Take 5 mg by mouth daily.   05/24/2015 at Unknown time  . ferrous sulfate (FERROUSUL) 325 (65 FE) MG tablet Take 1 tablet (325 mg total) by mouth daily with breakfast. 30 tablet 3 05/24/2015 at Unknown time  . furosemide (LASIX) 40 MG tablet Take 40 mg by mouth daily as needed for fluid.    Past Week at Unknown time  . gabapentin (NEURONTIN) 100 MG capsule Take 1 capsule (100 mg total) by mouth 3 (three) times daily. 90 capsule 5 05/24/2015 at Unknown time  . losartan (COZAAR) 100 MG tablet Take 100 mg by mouth daily.   05/26/2015 at Unknown time  . metoprolol tartrate (LOPRESSOR) 25 MG tablet Take 25 mg by mouth 2  (two) times daily.     05/24/2015 at Unknown time  . moxifloxacin (VIGAMOX) 0.5 % ophthalmic solution Place 1 drop into the left eye 2 (two) times daily.    05/24/2015 at Unknown time  . PROAIR HFA 108 (90 BASE) MCG/ACT inhaler Inhale 2 puffs into the lungs every 6 (six) hours as needed for wheezing or shortness of breath.    05/24/2015 at Unknown time  . timolol (TIMOPTIC) 0.25 % ophthalmic solution Place 1 drop into the right eye 2 (two) times daily.    05/24/2015 at Unknown time   Scheduled: . amLODipine  5 mg Oral Daily  . clindamycin (CLEOCIN) IV  600 mg Intravenous 3 times per day  . enoxaparin (LOVENOX) injection  30 mg Subcutaneous Q24H  . ferrous sulfate  325 mg Oral Q breakfast  . gabapentin  100 mg Oral TID  . gatifloxacin  1 drop Left Eye QID  . losartan  100 mg Oral Daily  . methylPREDNISolone (SOLU-MEDROL) injection  40 mg Intravenous Q12H  . metoprolol tartrate  25 mg Oral BID  . midodrine  2.5 mg Oral BID WC  . nystatin   Topical BID  . timolol  1 drop Right Eye BID   Continuous:  WVP:[id number], LORazepam, LORazepam, RESOURCE THICKENUP CLEAR  Assesment: She was admitted with cellulitis of her leg. She is known to have had a severe stroke 2. Her speech is difficult to understand. I think she has aspirated. Chest x-ray did not show pneumonia and her current antibiotic should cover atypical organisms for aspiration. She is on steroids and inhaled bronchodilators but still very congested and coughing. Speech evaluation is noted and appreciated Principal Problem:   Cellulitis of right lower leg Active Problems:   H/O: stroke with residual effects   Hypertension   Glaucoma   Spinal stenosis of lumbar region with radiculopathy   Cellulitis   Candida infection of genital region    Plan: Continue dysphagia diet. Continue antibiotics steroids etc. She's not ready for transfer. Depending on her clinical status tomorrow she may need another chest x-ray    LOS: 4 days    Asma Boldon L 05/29/2015, 8:46 AM

## 2015-05-30 ENCOUNTER — Inpatient Hospital Stay
Admission: RE | Admit: 2015-05-30 | Discharge: 2015-06-18 | Disposition: A | Discharge status: Home / Self Care | Payer: Medicare Other | Source: Ambulatory Visit | Attending: Pulmonary Disease | Admitting: Pulmonary Disease

## 2015-05-30 DIAGNOSIS — M6281 Muscle weakness (generalized): Secondary | ICD-10-CM | POA: Diagnosis not present

## 2015-05-30 DIAGNOSIS — B3749 Other urogenital candidiasis: Secondary | ICD-10-CM | POA: Diagnosis not present

## 2015-05-30 DIAGNOSIS — L03116 Cellulitis of left lower limb: Secondary | ICD-10-CM | POA: Diagnosis not present

## 2015-05-30 DIAGNOSIS — M5416 Radiculopathy, lumbar region: Secondary | ICD-10-CM | POA: Diagnosis not present

## 2015-05-30 DIAGNOSIS — L03115 Cellulitis of right lower limb: Secondary | ICD-10-CM | POA: Diagnosis not present

## 2015-05-30 DIAGNOSIS — Z9181 History of falling: Secondary | ICD-10-CM | POA: Diagnosis not present

## 2015-05-30 DIAGNOSIS — R278 Other lack of coordination: Secondary | ICD-10-CM | POA: Diagnosis not present

## 2015-05-30 DIAGNOSIS — I10 Essential (primary) hypertension: Secondary | ICD-10-CM | POA: Diagnosis not present

## 2015-05-30 DIAGNOSIS — R1312 Dysphagia, oropharyngeal phase: Secondary | ICD-10-CM | POA: Diagnosis not present

## 2015-05-30 DIAGNOSIS — I6930 Unspecified sequelae of cerebral infarction: Secondary | ICD-10-CM | POA: Diagnosis not present

## 2015-05-30 DIAGNOSIS — M4806 Spinal stenosis, lumbar region: Secondary | ICD-10-CM | POA: Diagnosis not present

## 2015-05-30 DIAGNOSIS — G92 Toxic encephalopathy: Secondary | ICD-10-CM | POA: Diagnosis not present

## 2015-05-30 DIAGNOSIS — R262 Difficulty in walking, not elsewhere classified: Secondary | ICD-10-CM | POA: Diagnosis not present

## 2015-05-30 MED ORDER — PREDNISONE 20 MG PO TABS: 40.0000 mg | ORAL_TABLET | Freq: Every day | ORAL | Status: DC

## 2015-05-30 MED ORDER — PREDNISONE 10 MG PO TABS: 40.0000 mg | ORAL_TABLET | Freq: Every day | ORAL | Status: DC

## 2015-05-30 MED ORDER — PREDNISONE 20 MG PO TABS
40.0000 mg | ORAL_TABLET | Freq: Every day | ORAL | Status: DC
Start: 2015-05-30 — End: 2015-05-30
  Administered 2015-05-30: 40 mg via ORAL
  Filled 2015-05-30: qty 2

## 2015-05-30 NOTE — Progress Notes (Signed)
She is much better. Much less agitated. Her breathing is markedly improved. She is not coughing. I think she is ready for transfer to skilled care facility now. I will arrange for transfer

## 2015-05-30 NOTE — Clinical Social Work Placement (Signed)
   CLINICAL SOCIAL WORK PLACEMENT  NOTE  Date:  05/30/2015  Patient Details  Name: Carla Bonilla MRN: [id number] Date of Birth: December 12, 1920  Clinical Social Work is seeking post-discharge placement for this patient at the Skilled  Nursing Facility level of care (*CSW will initial, date and re-position this form in  chart as items are completed):  Yes   Patient/family provided with [facility] Clinical Social Work Department's list of facilities offering this level of care within the geographic area requested by the patient (or if unable, by the patient's family).  Yes   Patient/family informed of their freedom to choose among providers that offer the needed level of care, that participate in Medicare, Medicaid or managed care program needed by the patient, have an available bed and are willing to accept the patient.  Yes   Patient/family informed of [facility]'s ownership interest [facility], as well as of the fact that they are under no obligation to receive care at these facilities.  PASRR submitted to EDS on       PASRR number received on       Existing PASRR number confirmed on 05/28/15 (Patient has an existing PASARR (patient's D.O.B in [place] is 11/17/1920))     FL2 transmitted to all facilities in geographic area requested by pt/family on 05/28/15     FL2 transmitted to all facilities within larger geographic area on       Patient informed that his/her managed care company has contracts with or will negotiate with certain facilities, including the following:        Yes   Patient/family informed of bed offers received.  [facility]     Physician recommends and patient chooses bed at      Patient to be [facility] to [facility] on 05/30/15.  Patient to be transferred to facility by       Patient family notified on 05/30/15 of transfer.  Name of family member notified:  Bonita QuinLinda      PHYSICIAN Please prepare priority discharge summary, including medications, Please prepare prescriptions, Please sign FL2, Please sign DNR     Additional Comment:  Per MD patient ready for [facility]. RN, patient, patient's family Bonita Quin(Linda), and facility notified of DC. RN given number for report. CSW signing off.   _______________________________________________ Venita Lickampbell, Lorrie Gargan B, LCSW 05/30/2015, 11:04 AM

## 2015-05-30 NOTE — Care Management Important Message (Signed)
Important Message  Patient Details  Name: Jaclynn MajorJean M Sanko MRN: [id number] Date of Birth: Feb 21, 1921   Medicare Important Message Given:  Yes    Adonis HugueninBerkhead, Teal Bontrager L, RN 05/30/2015, 8:03 AM

## 2015-05-30 NOTE — Progress Notes (Signed)
Pt's IV catheter removed and intact. Pt's IV site clean dry and intact. Pt in stable condition and in no acute distress at time of discharge. Report called and given to Ms. Maureen RalphsVivian at [facility]. Pt will be escorted by nurse tech.

## 2015-05-30 NOTE — Clinical Social Work Note (Addendum)
CSW notes that the patient is ready for transfer to [facility]. CSW will assist.  Message left with niece Bonita QuinLinda requesting call back.   Roddie McBryant Reynol Arnone MSW, [place]mos[place]CSW, [place], [phone]

## 2015-05-31 DIAGNOSIS — L03116 Cellulitis of left lower limb: Secondary | ICD-10-CM | POA: Diagnosis not present

## 2015-05-31 DIAGNOSIS — L03115 Cellulitis of right lower limb: Secondary | ICD-10-CM | POA: Diagnosis not present

## 2015-05-31 DIAGNOSIS — G92 Toxic encephalopathy: Secondary | ICD-10-CM | POA: Diagnosis not present

## 2015-06-02 NOTE — H&P (Signed)
NAME:  Ernest HaberASTERWOOD, Carla             ACCOUNT NO.:  [id number]  MEDICAL RECORD NO.:  [id number]  LOCATION:                                 FACILITY:  PHYSICIAN:  Jacere Pangborn L. Juanetta GoslingHawkins, M.D.DATE OF BIRTH:  [birth year]  DATE OF ADMISSION:  05/30/2015 DATE OF DISCHARGE:  LH                             HISTORY & PHYSICAL   PATIENT OF:  [facility].  HISTORY OF PRESENT ILLNESS:  Carla Bonilla is a 82 year old who was admitted to the skilled care facility for rehabilitation after being in the hospital with cellulitis of her right lower leg, deconditioning, and potential aspiration.  She started having trouble with cellulitis about 10 days prior to admission to the acute inpatient facility.  She was treated with intravenous clindamycin and improved.  However, she was being set up for a transfer to skilled care facility when she developed significant issues with cough and congestion.  She had speech evaluation.  It was felt that she was at risk of aspiration and should be on dysphagia diet with nectar thick liquids.  She improved over the next several days.  She still had some cough but was not short of breath.  She is now at the skilled care facility for rehabilitation.  PAST MEDICAL HISTORY:  Her past medical history is positive for hypertension.  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  She is hard of hearing.  She has had two CVAs, both of which have affected her speech, and she has significant dysarthria and is difficult to understand.  She has a history of right shoulder surgery and has had ORIF of her proximal humerus fracture.  She has had multiple eye surgeries.  SOCIAL HISTORY:  She lives at home.  She is a widow.  She has pretty much full-time care at home.  FAMILY HISTORY:  Essentially unknown and probably not consequential at her advanced age.  ALLERGIES:  She has no known allergies.  REVIEW OF SYSTEMS:  She says that she is still coughing.  She  still feels weak.  PHYSICAL EXAMINATION:  GENERAL:  Shows she is awake and alert.  She is pleasant but difficult to understand.  She is in the hall in a wheelchair. HEENT:  Shows the blindness of her left eye.  Nose and throat are clear. Mucous membranes are moist.  She does not have any JVD while sitting upright. CHEST:  Clear with decreased breath sounds. HEART:  Regular without gallop. ABDOMEN:  Soft.  No masses are felt. EXTREMITIES:  Showed no edema. CNS:  Central nervous system exam shows severe problems with speech, otherwise grossly intact.  ASSESSMENT AND PLAN:  The patient is at the skilled care facility for rehabilitation.  The plan will be for her to continue her antibiotics for about four more days, continue with everything else, and return home with Home Health Services when she is better with her ambulation and strength.     Elowen Debruyn L. Juanetta GoslingHawkins, M.D.     ELH/MEDQ  D:  06/02/2015  T:  06/02/2015  Job:  [id number]

## 2015-06-02 NOTE — Discharge Summary (Signed)
NAME:  Carla Bonilla, Carla Bonilla             ACCOUNT NO.:  [id number]  MEDICAL RECORD NO.:  [id number]  LOCATION:                                 FACILITY:  PHYSICIAN:  Ovide Dusek L. Juanetta GoslingHawkins, M.D.DATE OF BIRTH:  Dec 20, 1920  DATE OF ADMISSION:  05/30/2015 DATE OF DISCHARGE:  LH                              DISCHARGE SUMMARY   DISCHARGE DIAGNOSIS:  Toxic metabolic encephalopathy.     Onnika Siebel L. Juanetta GoslingHawkins, M.D.     ELH/MEDQ  D:  06/02/2015  T:  06/02/2015  Job:  [id number]

## 2015-06-19 DIAGNOSIS — I69322 Dysarthria following cerebral infarction: Secondary | ICD-10-CM | POA: Diagnosis not present

## 2015-06-19 DIAGNOSIS — I69351 Hemiplegia and hemiparesis following cerebral infarction affecting right dominant side: Secondary | ICD-10-CM | POA: Diagnosis not present

## 2015-06-19 DIAGNOSIS — H9190 Unspecified hearing loss, unspecified ear: Secondary | ICD-10-CM | POA: Diagnosis not present

## 2015-06-19 DIAGNOSIS — H5412 Blindness, left eye, low vision right eye: Secondary | ICD-10-CM | POA: Diagnosis not present

## 2015-06-19 DIAGNOSIS — I10 Essential (primary) hypertension: Secondary | ICD-10-CM | POA: Diagnosis not present

## 2015-06-20 DIAGNOSIS — I69351 Hemiplegia and hemiparesis following cerebral infarction affecting right dominant side: Secondary | ICD-10-CM | POA: Diagnosis not present

## 2015-06-20 DIAGNOSIS — H9190 Unspecified hearing loss, unspecified ear: Secondary | ICD-10-CM | POA: Diagnosis not present

## 2015-06-20 DIAGNOSIS — H5412 Blindness, left eye, low vision right eye: Secondary | ICD-10-CM | POA: Diagnosis not present

## 2015-06-20 DIAGNOSIS — I10 Essential (primary) hypertension: Secondary | ICD-10-CM | POA: Diagnosis not present

## 2015-06-20 DIAGNOSIS — I69322 Dysarthria following cerebral infarction: Secondary | ICD-10-CM | POA: Diagnosis not present

## 2015-06-23 DIAGNOSIS — I69351 Hemiplegia and hemiparesis following cerebral infarction affecting right dominant side: Secondary | ICD-10-CM | POA: Diagnosis not present

## 2015-06-23 DIAGNOSIS — H5412 Blindness, left eye, low vision right eye: Secondary | ICD-10-CM | POA: Diagnosis not present

## 2015-06-23 DIAGNOSIS — I69322 Dysarthria following cerebral infarction: Secondary | ICD-10-CM | POA: Diagnosis not present

## 2015-06-23 DIAGNOSIS — H9190 Unspecified hearing loss, unspecified ear: Secondary | ICD-10-CM | POA: Diagnosis not present

## 2015-06-23 DIAGNOSIS — I10 Essential (primary) hypertension: Secondary | ICD-10-CM | POA: Diagnosis not present

## 2015-06-25 DIAGNOSIS — I10 Essential (primary) hypertension: Secondary | ICD-10-CM | POA: Diagnosis not present

## 2015-06-25 DIAGNOSIS — H5412 Blindness, left eye, low vision right eye: Secondary | ICD-10-CM | POA: Diagnosis not present

## 2015-06-25 DIAGNOSIS — H9190 Unspecified hearing loss, unspecified ear: Secondary | ICD-10-CM | POA: Diagnosis not present

## 2015-06-25 DIAGNOSIS — I69351 Hemiplegia and hemiparesis following cerebral infarction affecting right dominant side: Secondary | ICD-10-CM | POA: Diagnosis not present

## 2015-06-25 DIAGNOSIS — I69322 Dysarthria following cerebral infarction: Secondary | ICD-10-CM | POA: Diagnosis not present

## 2015-06-27 DIAGNOSIS — H9190 Unspecified hearing loss, unspecified ear: Secondary | ICD-10-CM | POA: Diagnosis not present

## 2015-06-27 DIAGNOSIS — I69351 Hemiplegia and hemiparesis following cerebral infarction affecting right dominant side: Secondary | ICD-10-CM | POA: Diagnosis not present

## 2015-06-27 DIAGNOSIS — H5412 Blindness, left eye, low vision right eye: Secondary | ICD-10-CM | POA: Diagnosis not present

## 2015-06-27 DIAGNOSIS — I69322 Dysarthria following cerebral infarction: Secondary | ICD-10-CM | POA: Diagnosis not present

## 2015-06-27 DIAGNOSIS — I10 Essential (primary) hypertension: Secondary | ICD-10-CM | POA: Diagnosis not present

## 2015-06-30 DIAGNOSIS — I10 Essential (primary) hypertension: Secondary | ICD-10-CM | POA: Diagnosis not present

## 2015-06-30 DIAGNOSIS — I69322 Dysarthria following cerebral infarction: Secondary | ICD-10-CM | POA: Diagnosis not present

## 2015-06-30 DIAGNOSIS — H5412 Blindness, left eye, low vision right eye: Secondary | ICD-10-CM | POA: Diagnosis not present

## 2015-06-30 DIAGNOSIS — H9190 Unspecified hearing loss, unspecified ear: Secondary | ICD-10-CM | POA: Diagnosis not present

## 2015-06-30 DIAGNOSIS — I69351 Hemiplegia and hemiparesis following cerebral infarction affecting right dominant side: Secondary | ICD-10-CM | POA: Diagnosis not present

## 2015-07-02 DIAGNOSIS — H5412 Blindness, left eye, low vision right eye: Secondary | ICD-10-CM | POA: Diagnosis not present

## 2015-07-02 DIAGNOSIS — H9190 Unspecified hearing loss, unspecified ear: Secondary | ICD-10-CM | POA: Diagnosis not present

## 2015-07-02 DIAGNOSIS — I10 Essential (primary) hypertension: Secondary | ICD-10-CM | POA: Diagnosis not present

## 2015-07-02 DIAGNOSIS — I69322 Dysarthria following cerebral infarction: Secondary | ICD-10-CM | POA: Diagnosis not present

## 2015-07-02 DIAGNOSIS — I69351 Hemiplegia and hemiparesis following cerebral infarction affecting right dominant side: Secondary | ICD-10-CM | POA: Diagnosis not present

## 2015-07-03 DIAGNOSIS — I10 Essential (primary) hypertension: Secondary | ICD-10-CM | POA: Diagnosis not present

## 2015-07-03 DIAGNOSIS — H5412 Blindness, left eye, low vision right eye: Secondary | ICD-10-CM | POA: Diagnosis not present

## 2015-07-03 DIAGNOSIS — I69351 Hemiplegia and hemiparesis following cerebral infarction affecting right dominant side: Secondary | ICD-10-CM | POA: Diagnosis not present

## 2015-07-03 DIAGNOSIS — I69322 Dysarthria following cerebral infarction: Secondary | ICD-10-CM | POA: Diagnosis not present

## 2015-07-03 DIAGNOSIS — H9190 Unspecified hearing loss, unspecified ear: Secondary | ICD-10-CM | POA: Diagnosis not present

## 2015-07-07 DIAGNOSIS — I10 Essential (primary) hypertension: Secondary | ICD-10-CM | POA: Diagnosis not present

## 2015-07-07 DIAGNOSIS — I69322 Dysarthria following cerebral infarction: Secondary | ICD-10-CM | POA: Diagnosis not present

## 2015-07-07 DIAGNOSIS — H5412 Blindness, left eye, low vision right eye: Secondary | ICD-10-CM | POA: Diagnosis not present

## 2015-07-07 DIAGNOSIS — H9190 Unspecified hearing loss, unspecified ear: Secondary | ICD-10-CM | POA: Diagnosis not present

## 2015-07-07 DIAGNOSIS — I69351 Hemiplegia and hemiparesis following cerebral infarction affecting right dominant side: Secondary | ICD-10-CM | POA: Diagnosis not present

## 2015-07-08 DIAGNOSIS — H5412 Blindness, left eye, low vision right eye: Secondary | ICD-10-CM | POA: Diagnosis not present

## 2015-07-08 DIAGNOSIS — I69322 Dysarthria following cerebral infarction: Secondary | ICD-10-CM | POA: Diagnosis not present

## 2015-07-08 DIAGNOSIS — I69351 Hemiplegia and hemiparesis following cerebral infarction affecting right dominant side: Secondary | ICD-10-CM | POA: Diagnosis not present

## 2015-07-08 DIAGNOSIS — H9190 Unspecified hearing loss, unspecified ear: Secondary | ICD-10-CM | POA: Diagnosis not present

## 2015-07-08 DIAGNOSIS — I10 Essential (primary) hypertension: Secondary | ICD-10-CM | POA: Diagnosis not present

## 2015-07-09 DIAGNOSIS — I10 Essential (primary) hypertension: Secondary | ICD-10-CM | POA: Diagnosis not present

## 2015-07-09 DIAGNOSIS — H5412 Blindness, left eye, low vision right eye: Secondary | ICD-10-CM | POA: Diagnosis not present

## 2015-07-09 DIAGNOSIS — H9190 Unspecified hearing loss, unspecified ear: Secondary | ICD-10-CM | POA: Diagnosis not present

## 2015-07-09 DIAGNOSIS — I69322 Dysarthria following cerebral infarction: Secondary | ICD-10-CM | POA: Diagnosis not present

## 2015-07-09 DIAGNOSIS — I69351 Hemiplegia and hemiparesis following cerebral infarction affecting right dominant side: Secondary | ICD-10-CM | POA: Diagnosis not present

## 2015-07-10 DIAGNOSIS — I69351 Hemiplegia and hemiparesis following cerebral infarction affecting right dominant side: Secondary | ICD-10-CM | POA: Diagnosis not present

## 2015-07-10 DIAGNOSIS — H9190 Unspecified hearing loss, unspecified ear: Secondary | ICD-10-CM | POA: Diagnosis not present

## 2015-07-10 DIAGNOSIS — I10 Essential (primary) hypertension: Secondary | ICD-10-CM | POA: Diagnosis not present

## 2015-07-10 DIAGNOSIS — H5412 Blindness, left eye, low vision right eye: Secondary | ICD-10-CM | POA: Diagnosis not present

## 2015-07-10 DIAGNOSIS — I69322 Dysarthria following cerebral infarction: Secondary | ICD-10-CM | POA: Diagnosis not present

## 2015-07-14 DIAGNOSIS — H9190 Unspecified hearing loss, unspecified ear: Secondary | ICD-10-CM | POA: Diagnosis not present

## 2015-07-14 DIAGNOSIS — H5412 Blindness, left eye, low vision right eye: Secondary | ICD-10-CM | POA: Diagnosis not present

## 2015-07-14 DIAGNOSIS — I69351 Hemiplegia and hemiparesis following cerebral infarction affecting right dominant side: Secondary | ICD-10-CM | POA: Diagnosis not present

## 2015-07-14 DIAGNOSIS — I10 Essential (primary) hypertension: Secondary | ICD-10-CM | POA: Diagnosis not present

## 2015-07-14 DIAGNOSIS — I69322 Dysarthria following cerebral infarction: Secondary | ICD-10-CM | POA: Diagnosis not present

## 2015-07-16 DIAGNOSIS — H9190 Unspecified hearing loss, unspecified ear: Secondary | ICD-10-CM | POA: Diagnosis not present

## 2015-07-16 DIAGNOSIS — I69322 Dysarthria following cerebral infarction: Secondary | ICD-10-CM | POA: Diagnosis not present

## 2015-07-16 DIAGNOSIS — H5412 Blindness, left eye, low vision right eye: Secondary | ICD-10-CM | POA: Diagnosis not present

## 2015-07-16 DIAGNOSIS — I69351 Hemiplegia and hemiparesis following cerebral infarction affecting right dominant side: Secondary | ICD-10-CM | POA: Diagnosis not present

## 2015-07-16 DIAGNOSIS — I10 Essential (primary) hypertension: Secondary | ICD-10-CM | POA: Diagnosis not present

## 2015-08-11 DIAGNOSIS — R609 Edema, unspecified: Secondary | ICD-10-CM | POA: Diagnosis not present

## 2015-08-11 DIAGNOSIS — I10 Essential (primary) hypertension: Secondary | ICD-10-CM | POA: Diagnosis not present

## 2015-08-11 DIAGNOSIS — I6350 Cerebral infarction due to unspecified occlusion or stenosis of unspecified cerebral artery: Secondary | ICD-10-CM | POA: Diagnosis not present

## 2015-08-11 DIAGNOSIS — J9600 Acute respiratory failure, unspecified whether with hypoxia or hypercapnia: Secondary | ICD-10-CM | POA: Diagnosis not present

## 2015-11-19 DIAGNOSIS — I69322 Dysarthria following cerebral infarction: Secondary | ICD-10-CM | POA: Diagnosis not present

## 2015-11-19 DIAGNOSIS — I10 Essential (primary) hypertension: Secondary | ICD-10-CM | POA: Diagnosis not present

## 2015-11-19 DIAGNOSIS — I69361 Other paralytic syndrome following cerebral infarction affecting right dominant side: Secondary | ICD-10-CM | POA: Diagnosis not present

## 2015-11-19 DIAGNOSIS — R600 Localized edema: Secondary | ICD-10-CM | POA: Diagnosis not present

## 2016-02-04 DIAGNOSIS — J690 Pneumonitis due to inhalation of food and vomit: Secondary | ICD-10-CM | POA: Diagnosis not present

## 2016-02-04 DIAGNOSIS — I10 Essential (primary) hypertension: Secondary | ICD-10-CM | POA: Diagnosis not present

## 2016-02-04 DIAGNOSIS — I69322 Dysarthria following cerebral infarction: Secondary | ICD-10-CM | POA: Diagnosis not present

## 2016-02-23 DIAGNOSIS — I10 Essential (primary) hypertension: Secondary | ICD-10-CM | POA: Diagnosis not present

## 2016-02-23 DIAGNOSIS — L98499 Non-pressure chronic ulcer of skin of other sites with unspecified severity: Secondary | ICD-10-CM | POA: Diagnosis not present

## 2016-02-23 DIAGNOSIS — I69322 Dysarthria following cerebral infarction: Secondary | ICD-10-CM | POA: Diagnosis not present

## 2016-02-23 DIAGNOSIS — J690 Pneumonitis due to inhalation of food and vomit: Secondary | ICD-10-CM | POA: Diagnosis not present

## 2016-07-22 DIAGNOSIS — H401111 Primary open-angle glaucoma, right eye, mild stage: Secondary | ICD-10-CM | POA: Diagnosis not present

## 2017-04-25 ENCOUNTER — Emergency Department (HOSPITAL_COMMUNITY): Payer: Medicare Other

## 2017-04-25 ENCOUNTER — Emergency Department (HOSPITAL_COMMUNITY)
Admission: EM | Admit: 2017-04-25 | Discharge: 2017-04-25 | Disposition: A | Discharge status: Home / Self Care | Payer: Medicare Other | Attending: Emergency Medicine | Admitting: Emergency Medicine

## 2017-04-25 ENCOUNTER — Encounter (HOSPITAL_COMMUNITY): Payer: Self-pay | Admitting: Emergency Medicine

## 2017-04-25 DIAGNOSIS — Y9389 Activity, other specified: Secondary | ICD-10-CM | POA: Insufficient documentation

## 2017-04-25 DIAGNOSIS — I10 Essential (primary) hypertension: Secondary | ICD-10-CM | POA: Insufficient documentation

## 2017-04-25 DIAGNOSIS — W06XXXA Fall from bed, initial encounter: Secondary | ICD-10-CM | POA: Insufficient documentation

## 2017-04-25 DIAGNOSIS — Y999 Unspecified external cause status: Secondary | ICD-10-CM | POA: Diagnosis not present

## 2017-04-25 DIAGNOSIS — S8991XA Unspecified injury of right lower leg, initial encounter: Secondary | ICD-10-CM | POA: Diagnosis not present

## 2017-04-25 DIAGNOSIS — S3993XA Unspecified injury of pelvis, initial encounter: Secondary | ICD-10-CM | POA: Diagnosis present

## 2017-04-25 DIAGNOSIS — W19XXXA Unspecified fall, initial encounter: Secondary | ICD-10-CM

## 2017-04-25 DIAGNOSIS — Y92013 Bedroom of single-family (private) house as the place of occurrence of the external cause: Secondary | ICD-10-CM | POA: Insufficient documentation

## 2017-04-25 DIAGNOSIS — S80211A Abrasion, right knee, initial encounter: Secondary | ICD-10-CM | POA: Diagnosis not present

## 2017-04-25 DIAGNOSIS — Z79899 Other long term (current) drug therapy: Secondary | ICD-10-CM | POA: Insufficient documentation

## 2017-04-25 HISTORY — DX: Hyperlipidemia, unspecified: E78.5

## 2017-04-25 HISTORY — DX: Polyneuropathy, unspecified: G62.9

## 2017-04-25 HISTORY — DX: Cerebral infarction, unspecified: I63.9

## 2017-04-25 NOTE — ED Notes (Signed)
Pt picked up by [facility]. Prior to transport pts caregiver verbalized understanding of discharge instructions.

## 2017-04-25 NOTE — ED Notes (Signed)
Applied 2 LPM of oxygen via [place], sats increased to 96%. NAD noted.

## 2017-04-25 NOTE — ED Provider Notes (Signed)
[facility] Provider Note   CSN: [id number] Arrival date & time: 04/25/17  1653     History   Chief Complaint Chief Complaint  Patient presents with  . Fall    HPI Carla Bonilla is a 84 y.o. female.  Chief complaint is fall.  HPI 84 year old female.  Has a caregiver at home.  Apparently slid from bed to floor while her caregiver was there.  No obvious injuries.  Caregiver was concerned about "her hips".  Did not strike her head or lose consciousness.  Patient with prior stroke and is arthritic secondary to this.  Per provider patient is mentating at her baseline.  Past Medical History:  Diagnosis Date  . Hyperlipidemia   . Hypertension   . Neuropathy   . Stroke [facility](HCC)     There are no active problems to display for this patient.     OB History    No data available       Home Medications    Prior to Admission medications   Medication Sig Start Date End Date Taking? Authorizing Provider  albuterol (PROVENTIL HFA;VENTOLIN HFA) 108 (90 Base) MCG/ACT inhaler Inhale 1-2 puffs into the lungs every 6 (six) hours as needed for wheezing or shortness of breath.   Yes [provider]  amLODipine (NORVASC) 5 MG tablet Take 5 mg by mouth every morning.   Yes [provider]  furosemide (LASIX) 40 MG tablet Take 40 mg by mouth daily as needed.    Yes [provider]  gabapentin (NEURONTIN) 100 MG capsule Take 100 mg by mouth 3 (three) times daily.   Yes [provider]  losartan (COZAAR) 100 MG tablet Take 100 mg by mouth every morning.   Yes [provider]  metoprolol tartrate (LOPRESSOR) 25 MG tablet Take 25 mg by mouth 2 (two) times daily.    Yes [provider]  sertraline (ZOLOFT) 50 MG tablet Take 50 mg by mouth every morning.   Yes [provider]  timolol (TIMOPTIC) 0.25 % ophthalmic solution Place 1 drop into the right eye at bedtime.   Yes [provider]    Family  History No family history on file.  Social History Social History   Tobacco Use  . Smoking status: Unknown If Ever Smoked  Substance Use Topics  . Alcohol use: No    Frequency: Never  . Drug use: No     Allergies   Patient has no known allergies.   Review of Systems Review of Systems  Constitutional: Negative for appetite change, chills, diaphoresis, fatigue and fever.  HENT: Negative for mouth sores, sore throat and trouble swallowing.   Eyes: Negative for visual disturbance.  Respiratory: Negative for cough, chest tightness, shortness of breath and wheezing.   Cardiovascular: Negative for chest pain.  Gastrointestinal: Negative for abdominal distention, abdominal pain, diarrhea, nausea and vomiting.  Endocrine: Negative for polydipsia, polyphagia and polyuria.  Genitourinary: Negative for dysuria, frequency and hematuria.  Musculoskeletal: Negative for gait problem.  Skin: Negative for color change, pallor and rash.  Neurological: Negative for dizziness, syncope, light-headedness and headaches.  Hematological: Does not bruise/bleed easily.  Psychiatric/Behavioral: Negative for behavioral problems and confusion.     Physical Exam Updated Vital Signs BP (!) 142/54 (BP Location: Left Arm)   Pulse 87   Temp 98 F (36.7 C) (Oral)   Resp 18   SpO2 (!) 89%   Physical Exam  Constitutional: She is oriented to person, place, and time. She appears  well-developed and well-nourished. No distress.  HENT:  Head: Normocephalic.  Eyes: Conjunctivae are normal. Pupils are equal, round, and reactive to light. No scleral icterus.  Neck: Normal range of motion. Neck supple. No thyromegaly present.  Cardiovascular: Normal rate and regular rhythm. Exam reveals no gallop and no friction rub.  No murmur heard. Pulmonary/Chest: Effort normal and breath sounds normal. No respiratory distress. She has no wheezes. She has no rales.  Abdominal: Soft. Bowel sounds are normal. She exhibits  no distension. There is no tenderness. There is no rebound.  Musculoskeletal: Normal range of motion.  Full range of motion of the hips and knee.  Has some abrasion below the right knee.  Nontender to palpate over the bony prominences of the knee hips and pelvis.  Neurological: She is alert and oriented to person, place, and time.  Skin: Skin is warm and dry. No rash noted.  Psychiatric: She has a normal mood and affect. Her behavior is normal.     ED Treatments / Results  Labs (all labs ordered are listed, but only abnormal results are displayed) Labs Reviewed - No data to display  EKG  EKG Interpretation None       Radiology Dg Pelvis 1-2 Views  Result Date: 04/25/2017 CLINICAL DATA:  Larey Seat today.  Unable to straighten knee. EXAM: PELVIS - 1-2 VIEW; RIGHT KNEE - COMPLETE 4+ VIEW COMPARISON:  None. FINDINGS: No evidence of fracture, dislocation or joint effusion. Mild medial compartment joint space narrowing. Ordinary arterial calcification. IMPRESSION: No acute or traumatic finding. Essentially normal for age. Mild medial compartment joint space narrowing. Electronically Signed   By: Paulina Fusi M.D.   On: 04/25/2017 17:57   Dg Knee Complete 4 Views Right  Result Date: 04/25/2017 CLINICAL DATA:  Larey Seat today.  Unable to straighten knee. EXAM: PELVIS - 1-2 VIEW; RIGHT KNEE - COMPLETE 4+ VIEW COMPARISON:  None. FINDINGS: No evidence of fracture, dislocation or joint effusion. Mild medial compartment joint space narrowing. Ordinary arterial calcification. IMPRESSION: No acute or traumatic finding. Essentially normal for age. Mild medial compartment joint space narrowing. Electronically Signed   By: Paulina Fusi M.D.   On: 04/25/2017 17:57    Procedures Procedures (including critical care time)  Medications Ordered in ED Medications - No data to display   Initial Impression / Assessment and Plan / ED Course  I have reviewed the triage vital signs and the nursing notes.  Pertinent  labs & imaging results that were available during my care of the patient were reviewed by me and considered in my medical decision making (see chart for details).   X-rays of the pelvis are normal.  Normal knee films.  These all have senescent changes but no acute abnormalities.  She is appropriate for discharge back to her home with her continued home care  Final Clinical Impressions(s) / ED Diagnoses   Final diagnoses:  Fall, initial encounter    ED Discharge Orders    None       Rolland Porter, MD 04/25/17 1805

## 2017-04-25 NOTE — ED Triage Notes (Signed)
Pt had a fall today per caregiver and sent over evaluation for hip pain. EMS states no rotation or shortening. Pt at baseline mentation.

## 2017-04-25 NOTE — Discharge Instructions (Signed)
No injuries were found during your ER evaluation today. Follow-up with your primary care physician as needed

## 2017-05-31 ENCOUNTER — Encounter (HOSPITAL_COMMUNITY): Payer: Self-pay | Admitting: Emergency Medicine

## 2017-06-25 ENCOUNTER — Encounter (HOSPITAL_COMMUNITY): Payer: Self-pay | Admitting: Emergency Medicine

## 2017-06-25 ENCOUNTER — Inpatient Hospital Stay (HOSPITAL_COMMUNITY)
Admission: EM | Admit: 2017-06-25 | Discharge: 2017-06-29 | DRG: 177 | Disposition: A | Discharge status: Skilled Nursing Facility | Payer: Medicare Other | Attending: Pulmonary Disease | Admitting: Pulmonary Disease

## 2017-06-25 ENCOUNTER — Emergency Department (HOSPITAL_COMMUNITY): Payer: Medicare Other

## 2017-06-25 ENCOUNTER — Other Ambulatory Visit: Payer: Self-pay

## 2017-06-25 DIAGNOSIS — J189 Pneumonia, unspecified organism: Secondary | ICD-10-CM | POA: Diagnosis present

## 2017-06-25 DIAGNOSIS — G9340 Encephalopathy, unspecified: Secondary | ICD-10-CM | POA: Diagnosis not present

## 2017-06-25 DIAGNOSIS — I69322 Dysarthria following cerebral infarction: Secondary | ICD-10-CM | POA: Diagnosis not present

## 2017-06-25 DIAGNOSIS — I69354 Hemiplegia and hemiparesis following cerebral infarction affecting left non-dominant side: Secondary | ICD-10-CM | POA: Diagnosis not present

## 2017-06-25 DIAGNOSIS — R748 Abnormal levels of other serum enzymes: Secondary | ICD-10-CM | POA: Diagnosis present

## 2017-06-25 DIAGNOSIS — G9341 Metabolic encephalopathy: Secondary | ICD-10-CM | POA: Diagnosis present

## 2017-06-25 DIAGNOSIS — H5462 Unqualified visual loss, left eye, normal vision right eye: Secondary | ICD-10-CM | POA: Diagnosis present

## 2017-06-25 DIAGNOSIS — J690 Pneumonitis due to inhalation of food and vomit: Principal | ICD-10-CM | POA: Diagnosis present

## 2017-06-25 DIAGNOSIS — H409 Unspecified glaucoma: Secondary | ICD-10-CM | POA: Diagnosis present

## 2017-06-25 DIAGNOSIS — I6930 Unspecified sequelae of cerebral infarction: Secondary | ICD-10-CM

## 2017-06-25 DIAGNOSIS — H5789 Other specified disorders of eye and adnexa: Secondary | ICD-10-CM | POA: Diagnosis present

## 2017-06-25 DIAGNOSIS — R05 Cough: Secondary | ICD-10-CM | POA: Diagnosis present

## 2017-06-25 DIAGNOSIS — I10 Essential (primary) hypertension: Secondary | ICD-10-CM | POA: Diagnosis present

## 2017-06-25 DIAGNOSIS — E785 Hyperlipidemia, unspecified: Secondary | ICD-10-CM | POA: Diagnosis present

## 2017-06-25 DIAGNOSIS — J181 Lobar pneumonia, unspecified organism: Secondary | ICD-10-CM | POA: Diagnosis not present

## 2017-06-25 LAB — COMPREHENSIVE METABOLIC PANEL
ALK PHOS: 83 U/L (ref 38–126)
ALT: 15 U/L (ref 14–54)
AST: 20 U/L (ref 15–41)
Albumin: 3 g/dL — ABNORMAL LOW (ref 3.5–5.0)
Anion gap: 12 (ref 5–15)
BILIRUBIN TOTAL: 0.8 mg/dL (ref 0.3–1.2)
BUN: 37 mg/dL — AB (ref 6–20)
CALCIUM: 8.9 mg/dL (ref 8.9–10.3)
CO2: 25 mmol/L (ref 22–32)
Chloride: 106 mmol/L (ref 101–111)
Creatinine, Ser: 1.04 mg/dL — ABNORMAL HIGH (ref 0.44–1.00)
GFR calc Af Amer: 52 mL/min — ABNORMAL LOW (ref >60)
GFR, EST NON AFRICAN AMERICAN: 45 mL/min — AB (ref >60)
Glucose, Bld: 115 mg/dL — ABNORMAL HIGH (ref 65–99)
POTASSIUM: 4.7 mmol/L (ref 3.5–5.1)
Sodium: 143 mmol/L (ref 135–145)
TOTAL PROTEIN: 7.1 g/dL (ref 6.5–8.1)

## 2017-06-25 LAB — CBC WITH DIFFERENTIAL/PLATELET
Basophils Absolute: 0 10*3/uL (ref 0.0–0.1)
Basophils Relative: 0 %
Eosinophils Absolute: 0 10*3/uL (ref 0.0–0.7)
Eosinophils Relative: 0 %
HEMATOCRIT: 33 % — AB (ref 36.0–46.0)
HEMOGLOBIN: 10 g/dL — AB (ref 12.0–15.0)
LYMPHS PCT: 12 %
Lymphs Abs: 1.7 10*3/uL (ref 0.7–4.0)
MCH: 26.5 pg (ref 26.0–34.0)
MCHC: 30.3 g/dL (ref 30.0–36.0)
MCV: 87.3 fL (ref 78.0–100.0)
MONOS PCT: 10 %
Monocytes Absolute: 1.5 10*3/uL — ABNORMAL HIGH (ref 0.1–1.0)
NEUTROS ABS: 11.5 10*3/uL — AB (ref 1.7–7.7)
Neutrophils Relative %: 78 %
Platelets: 404 10*3/uL — ABNORMAL HIGH (ref 150–400)
RBC: 3.78 MIL/uL — ABNORMAL LOW (ref 3.87–5.11)
RDW: 20.3 % — AB (ref 11.5–15.5)
WBC: 14.7 10*3/uL — ABNORMAL HIGH (ref 4.0–10.5)

## 2017-06-25 LAB — URINALYSIS, ROUTINE W REFLEX MICROSCOPIC
BILIRUBIN URINE: NEGATIVE
Glucose, UA: NEGATIVE mg/dL
HGB URINE DIPSTICK: NEGATIVE
KETONES UR: 5 mg/dL — AB
LEUKOCYTES UA: NEGATIVE
Nitrite: NEGATIVE
PH: 5 (ref 5.0–8.0)
PROTEIN: NEGATIVE mg/dL
Specific Gravity, Urine: 1.018 (ref 1.005–1.030)

## 2017-06-25 LAB — LIPASE, BLOOD: LIPASE: 31 U/L (ref 11–51)

## 2017-06-25 LAB — TROPONIN I: Troponin I: 0.03 ng/mL (ref <0.03)

## 2017-06-25 MED ORDER — SODIUM CHLORIDE 0.9 % IV SOLN
3.0000 g | Freq: Once | INTRAVENOUS | Status: AC
  Administered 2017-06-26: 3 g via INTRAVENOUS
  Filled 2017-06-25 (×2): qty 3

## 2017-06-25 MED ORDER — ONDANSETRON HCL 4 MG PO TABS: 4.0000 mg | ORAL_TABLET | Freq: Four times a day (QID) | ORAL | Status: DC | PRN

## 2017-06-25 MED ORDER — ENOXAPARIN SODIUM 40 MG/0.4ML ~~LOC~~ SOLN
40.0000 mg | SUBCUTANEOUS | Status: DC
  Administered 2017-06-25 – 2017-06-26 (×2): 40 mg via SUBCUTANEOUS
  Filled 2017-06-25 (×2): qty 0.4

## 2017-06-25 MED ORDER — AMLODIPINE BESYLATE 5 MG PO TABS
5.0000 mg | ORAL_TABLET | Freq: Every day | ORAL | Status: DC
  Administered 2017-06-26 – 2017-06-29 (×4): 5 mg via ORAL
  Filled 2017-06-25 (×4): qty 1

## 2017-06-25 MED ORDER — SODIUM CHLORIDE 0.9 % IV SOLN
1.0000 g | Freq: Once | INTRAVENOUS | Status: AC
  Administered 2017-06-25: 1 g via INTRAVENOUS
  Filled 2017-06-25: qty 10

## 2017-06-25 MED ORDER — ORAL CARE MOUTH RINSE
15.0000 mL | Freq: Two times a day (BID) | OROMUCOSAL | Status: DC
  Administered 2017-06-25 – 2017-06-29 (×7): 15 mL via OROMUCOSAL

## 2017-06-25 MED ORDER — METOPROLOL TARTRATE 25 MG PO TABS
25.0000 mg | ORAL_TABLET | Freq: Two times a day (BID) | ORAL | Status: DC
  Administered 2017-06-26 – 2017-06-29 (×6): 25 mg via ORAL
  Filled 2017-06-25 (×7): qty 1

## 2017-06-25 MED ORDER — LABETALOL HCL 5 MG/ML IV SOLN: 5.0000 mg | INTRAVENOUS | Status: DC | PRN

## 2017-06-25 MED ORDER — LOSARTAN POTASSIUM 25 MG PO TABS
100.0000 mg | ORAL_TABLET | Freq: Every day | ORAL | Status: DC
Start: 2017-06-26 — End: 2017-06-29
  Administered 2017-06-26: 25 mg via ORAL
  Administered 2017-06-27 – 2017-06-29 (×3): 100 mg via ORAL
  Filled 2017-06-25 (×4): qty 4

## 2017-06-25 MED ORDER — ERYTHROMYCIN 5 MG/GM OP OINT
TOPICAL_OINTMENT | Freq: Three times a day (TID) | OPHTHALMIC | Status: DC
  Administered 2017-06-26 – 2017-06-27 (×6): via OPHTHALMIC
  Administered 2017-06-27: 1 via OPHTHALMIC
  Administered 2017-06-28 – 2017-06-29 (×5): via OPHTHALMIC
  Filled 2017-06-25: qty 3.5

## 2017-06-25 MED ORDER — TIMOLOL MALEATE 0.25 % OP SOLN
1.0000 [drp] | Freq: Every day | OPHTHALMIC | Status: DC
  Administered 2017-06-25 – 2017-06-28 (×5): 1 [drp] via OPHTHALMIC
  Filled 2017-06-25: qty 5

## 2017-06-25 MED ORDER — IPRATROPIUM-ALBUTEROL 0.5-2.5 (3) MG/3ML IN SOLN
3.0000 mL | Freq: Four times a day (QID) | RESPIRATORY_TRACT | Status: DC
  Administered 2017-06-25 – 2017-06-26 (×5): 3 mL via RESPIRATORY_TRACT
  Filled 2017-06-25 (×5): qty 3

## 2017-06-25 MED ORDER — AZITHROMYCIN 500 MG IV SOLR
500.0000 mg | Freq: Once | INTRAVENOUS | Status: AC
  Administered 2017-06-25: 500 mg via INTRAVENOUS
  Filled 2017-06-25: qty 500

## 2017-06-25 MED ORDER — ONDANSETRON HCL 4 MG/2ML IJ SOLN
4.0000 mg | Freq: Four times a day (QID) | INTRAMUSCULAR | Status: DC | PRN
  Administered 2017-06-26: 4 mg via INTRAVENOUS
  Filled 2017-06-25: qty 2

## 2017-06-25 MED ORDER — SERTRALINE HCL 50 MG PO TABS
50.0000 mg | ORAL_TABLET | Freq: Every morning | ORAL | Status: DC
  Administered 2017-06-26 – 2017-06-29 (×4): 50 mg via ORAL
  Filled 2017-06-25 (×4): qty 1

## 2017-06-25 MED ORDER — POLYETHYLENE GLYCOL 3350 17 G PO PACK: 17.0000 g | PACK | Freq: Every day | ORAL | Status: DC | PRN

## 2017-06-25 MED ORDER — SODIUM CHLORIDE 0.9 % IV SOLN
500.0000 mg | INTRAVENOUS | Status: DC
  Administered 2017-06-26: 500 mg via INTRAVENOUS
  Filled 2017-06-25: qty 500

## 2017-06-25 NOTE — H&P (Addendum)
History and Physical    Carla MajorJean M Bisping UJW:[id number] DOB: [birth year] DOA: 06/25/2017  PCP: Kari BaarsHawkins, Edward, MD   Patient coming from: Home   Chief Complaint: Confusion, Cough,   HPI: Carla Bonilla is a 84 y.o. female with medical history significant for CVA- with left sided deficits, HTN, spinal stenosis.  History was obtained mostly from chart review, as patient's is confused as at the time of my evaluation. Attempted contacting Care giver- got voice mail. Patient was brought to the ED today by family with complaints of confusion, and congested cough.  Also noted nausea vomiting and foul-smelling urine.  Family notes patient coughing after vomiting.  ED Course: Tachypneic to 23, O2 sats greater than 97% on 2l [place].  WBC elevated at 14, hemoglobin low at 10, BUN- 37, otherwise unremarkable BMP.  Lipase normal 31.  Mildly Elevated troponin 0.03.  UA clean. Port chest x-ray shows right lung base opacity-atelectasis or pneumonia.  PAtient was started on IV ceftriaxone and azithromycin in the ED. Hospitalist was called to admit for CAP.  Review of Systems: Unable to review systems due to patient's altered mental status..   Past Medical History:  Diagnosis Date  . Blind left eye   . CVA (cerebral vascular accident) (HCC)    speech problem  . Glaucoma   . Hearing loss   . Hyperlipidemia   . Hypertension   . Neuropathy   . Stroke [facility](HCC)     Past Surgical History:  Procedure Laterality Date  . EYE SURGERY     left  . ORIF HUMERUS FRACTURE  11/25/2010   Procedure: OPEN REDUCTION INTERNAL FIXATION (ORIF) PROXIMAL HUMERUS FRACTURE;  Surgeon: Fuller CanadaStanley Harrison, MD;  Location: [facility];  Service: Orthopedics;  Laterality: Right;  . SHOULDER SURGERY     RIGHT shoulder open treatment internal fixation with locking plate. Date of surgery November 25, 2010   reports that she has never smoked. She does not have any smokeless tobacco history on file. She reports that she does not drink alcohol or  use drugs.  No Known Allergies  Family History  Problem Relation Age of Onset  . Heart disease Unknown   . Colon cancer Neg Hx     Prior to Admission medications   Medication Sig Start Date End Date Taking? Authorizing Provider  amLODipine (NORVASC) 5 MG tablet Take 5 mg by mouth daily.   Yes [provider]  gabapentin (NEURONTIN) 100 MG capsule Take 1 capsule (100 mg total) by mouth 3 (three) times daily. 07/17/13  Yes Vickki HearingHarrison, Stanley E, MD  losartan (COZAAR) 100 MG tablet Take 100 mg by mouth daily. 01/29/13  Yes [provider]  metoprolol tartrate (LOPRESSOR) 25 MG tablet Take 25 mg by mouth 2 (two) times daily.     Yes [provider]  sertraline (ZOLOFT) 50 MG tablet Take 50 mg by mouth every morning.   Yes [provider]  timolol (TIMOPTIC) 0.25 % ophthalmic solution Place 1 drop into the right eye at bedtime.   Yes [provider]  albuterol (PROVENTIL HFA;VENTOLIN HFA) 108 (90 Base) MCG/ACT inhaler Inhale 1-2 puffs into the lungs every 6 (six) hours as needed for wheezing or shortness of breath.    [provider]  amoxicillin (AMOXIL) 500 MG capsule  04/02/17   [provider]  clindamycin (CLEOCIN) 300 MG capsule Take 1 capsule (300 mg total) by mouth 3 (three) times daily. Patient not taking: Reported on 06/25/2017 05/28/15   Kari BaarsHawkins, Edward, MD  ferrous sulfate (FERROUSUL) 325 (65 FE) MG tablet Take 1 tablet (325 mg total) by mouth daily with breakfast. 06/27/14   Kari Baars, MD  furosemide (LASIX) 40 MG tablet Take 40 mg by mouth daily as needed for fluid.  01/29/13   [provider]  furosemide (LASIX) 40 MG tablet Take 40 mg by mouth daily as needed.     [provider]  LORazepam (ATIVAN) 0.5 MG tablet Take 1 tablet (0.5 mg total) by mouth every 4 (four) hours as needed for anxiety. 05/28/15   Kari Baars, MD  midodrine (PROAMATINE) 2.5 MG tablet Take 1 tablet (2.5 mg total) by mouth 2 (two)  times daily with a meal. 05/28/15   Kari Baars, MD  moxifloxacin (VIGAMOX) 0.5 % ophthalmic solution Place 1 drop into the left eye 2 (two) times daily.     [provider]  nystatin (MYCOSTATIN/NYSTOP) 100000 UNIT/GM POWD Apply 1 g topically 2 (two) times daily. 05/28/15   Kari Baars, MD  predniSONE (DELTASONE) 10 MG tablet Take 4 tablets (40 mg total) by mouth daily with breakfast. 05/30/15   Kari Baars, MD  predniSONE (DELTASONE) 20 MG tablet Take 2 tablets (40 mg total) by mouth daily with breakfast. 05/30/15   Kari Baars, MD  PROAIR HFA 108 (90 BASE) MCG/ACT inhaler Inhale 2 puffs into the lungs every 6 (six) hours as needed for wheezing or shortness of breath.  01/04/13   [provider]    Physical Exam: Most of physical exam limited by patient's mental status Vitals:   06/25/17 1930 06/25/17 2000 06/25/17 2030 06/25/17 2100  BP: (!) 140/56 (!) 126/56 119/69 (!) 135/57  Pulse: 73 71 84 76  Resp: (!) 21 (!) 23 (!) 21 (!) 22  Temp:      TempSrc:      SpO2: 99% 98% 99% 97%  Weight:      Height:        Constitutional:  eyes closed, appears lethargic but arousable to voice Vitals:   06/25/17 1930 06/25/17 2000 06/25/17 2030 06/25/17 2100  BP: (!) 140/56 (!) 126/56 119/69 (!) 135/57  Pulse: 73 71 84 76  Resp: (!) 21 (!) 23 (!) 21 (!) 22  Temp:      TempSrc:      SpO2: 99% 98% 99% 97%  Weight:      Height:       Eyes: Right not reactive to light, left pupil corneal scarring, slight yellowish drainage from right eye ENMT: Mucous membranes are moist. Posterior pharynx clear of any exudate or lesions.Normal dentition.  Neck: normal, supple, no masses, no thyromegaly Respiratory: reduced breath sounds right base, otherwise clear to auscultation bilaterally, no wheezing, no crackles. Normal respiratory effort. No accessory muscle use.  Cardiovascular: Regular rate and rhythm, no murmurs / rubs / gallops. No extremity edema. 2+ pedal pulses. No carotid  bruits.  Abdomen: no tenderness, no masses palpated. No hepatosplenomegaly. Bowel sounds positive.  Musculoskeletal: Appears to have mild flexion contractures involving bilateral knees R>L Skin: no rashes, lesions, ulcers. No induration Neurologic: Moving extremities spontaneously, unable to fully assess Psychiatric: Lethargic, confused, vocalizing in answer to questions were answered notes appropriate  Labs on Admission: I have personally reviewed following labs and imaging studies  CBC: Recent Labs  Lab 06/25/17 1725  WBC 14.7*  NEUTROABS 11.5*  HGB 10.0*  HCT 33.0*  MCV 87.3  PLT 404*   Basic Metabolic Panel: Recent Labs  Lab 06/25/17 1725  NA 143  K 4.7  CL 106  CO2 25  GLUCOSE 115*  BUN 37*  CREATININE 1.04*  CALCIUM 8.9   Liver Function Tests: Recent Labs  Lab 06/25/17 1725  AST 20  ALT 15  ALKPHOS 83  BILITOT 0.8  PROT 7.1  ALBUMIN 3.0*   Recent Labs  Lab 06/25/17 1725  LIPASE 31   Cardiac Enzymes: Recent Labs  Lab 06/25/17 1725  TROPONINI 0.03*   Urine analysis:    Component Value Date/Time   COLORURINE YELLOW 06/25/2017 1812   APPEARANCEUR CLEAR 06/25/2017 1812   LABSPEC 1.018 06/25/2017 1812   PHURINE 5.0 06/25/2017 1812   GLUCOSEU NEGATIVE 06/25/2017 1812   HGBUR NEGATIVE 06/25/2017 1812   BILIRUBINUR NEGATIVE 06/25/2017 1812   KETONESUR 5 (A) 06/25/2017 1812   PROTEINUR NEGATIVE 06/25/2017 1812   UROBILINOGEN 0.2 10/07/2013 1325   NITRITE NEGATIVE 06/25/2017 1812   LEUKOCYTESUR NEGATIVE 06/25/2017 1812    Radiological Exams on Admission: Dg Chest Portable 1 View  Result Date: 06/25/2017 CLINICAL DATA:  pt being brought in for evaluation related to UTI and congested cough, pt held for 2nd view EXAM: PORTABLE CHEST 1 VIEW COMPARISON:  05/28/2015 FINDINGS: Cardiac silhouette is normal in size. No convincing mediastinal or hilar masses. There is opacity at the right lung base partly silhouetting the lateral right hemidiaphragm. This  may be due to atelectasis or pneumonia. Prominent bronchovascular markings are noted bilaterally with no other areas of consolidation. No convincing pleural effusion.  No pneumothorax. Skeletal structures are demineralized. There partly imaged changes from the reduction of the right humeral fracture, stable. IMPRESSION: 1. Right lung base opacity consistent with pneumonia or atelectasis. No evidence of pulmonary edema. Electronically Signed   By: Amie Portland M.D.   On: 06/25/2017 17:42    EKG: Independently reviewed.  T wave inversions in lead III only, otherwise No significant change from prior.  Assessment/Plan Principal Problem:   CAP (community acquired pneumonia) Active Problems:   H/O: stroke with residual effects   Hypertension   Pneumonia-aspiration versus community-acquired.  Coughing after vomiting.  Tachypnea. WBC elevated 14.  IV ceftriaxone and azithro started in ED. Portable chest x-ray- Right base opacity. History and imaging suggestive of aspiration.  Meets SIRS criteria. - Will start Unasyn per pharm consult -Continue azithromycin for possible CAP -Blood cultures X 2 (antibiotics given) -Lactic acid -Swallow evaluation - CBC a.m  Metabolic encephalopathy-likely 2/2 pneumonia. UA clean. -N.p.o. for now with improvement in mental status -Hold home Ativan, gabapentin for now - PT eval  Abdominal Pain, Vomiting- per reports. Benign abominal exam. Normal Lipase and liver enzymes. - Low threshold for abdominal imaging.  Elevated troponin- 0.06. no reports of chest pa.  Likely related to pneumonia.  EKG unchanged. -Trend troponins  HTN-systolic mostly 120s-140s. - NPO for now -Resume metoprolol Norvasc losartan in a.m. pending improvement in mental status -PRN IV labetalol  Right Eye Discharge- Topical erythromycin ointment.   DVT prophylaxis: Lovenox Code Status:  Full- Unsuccessfully attempted contacting family. Family Communication: None at bedside    disposition Plan: >2 days Consults called: none Admission status: inpt, tele   Onnie Boer MD [facility] Pager [phone] From 3PM-11PM.  Otherwise please contact night-coverage [email or web address] Password [facility]  06/25/2017, 9:56 PM

## 2017-06-25 NOTE — ED Triage Notes (Addendum)
Per EMS, pt being brought in for evaluation related to UTI and congested cough. Pt not happy about being sent for evaluation but pt family reports increased confusion and urinary symptoms at home for last several days. Pt alert at time of arrival.

## 2017-06-25 NOTE — ED Notes (Signed)
Babette Relicammy, Caregiver [phone]

## 2017-06-25 NOTE — ED Provider Notes (Signed)
[facility] EMERGENCY DEPARTMENT Provider Note   CSN: [id number] Arrival date & time: 06/25/17  1619     History   Chief Complaint Chief Complaint  Patient presents with  . Urinary Frequency    HPI Carla Bonilla is a 84 y.o. female.  HPI Level 5 caveat due to altered mental status. Patient brought in by family member.  Reportedly has been more confused.  Has had nausea and vomiting and more foul-smelling urine.  Has had the symptoms for the last 2 days.  Had had some abdominal pain.  Patient really cannot speak for me but family member states she had been speaking more earlier today.  Has a previous stroke and is chronically weak in the left side and has somewhat decreased baseline verbal status.  Has had a little bit of a cough also.  Family member states she does cough after she vomits. Past Medical History:  Diagnosis Date  . Blind left eye   . CVA (cerebral vascular accident) (HCC)    speech problem  . Glaucoma   . Hearing loss   . Hyperlipidemia   . Hypertension   . Neuropathy   . Stroke [facility])     Patient Active Problem List   Diagnosis Date Noted  . Candida infection of genital region 05/28/2015  . Cellulitis of right lower leg 05/24/2015  . Cellulitis 05/24/2015  . GI bleeding 06/27/2014  . Heme positive stool   . Acute blood loss anemia   . Syncope and collapse 06/23/2014  . Laceration 06/23/2014  . Syncope 10/07/2013  . Spinal stenosis of lumbar region with radiculopathy 01/06/2012  . Sciatica neuralgia 01/06/2012  . Proximal humerus fracture 03/30/2011  . S/P shoulder surgery 12/08/2010  . Fracture, shoulder 12/08/2010  . Fracture of humerus, distal, right, closed 11/24/2010  . H/O: stroke with residual effects 11/24/2010  . Hypertension 11/24/2010  . Glaucoma 11/24/2010    Past Surgical History:  Procedure Laterality Date  . EYE SURGERY     left  . ORIF HUMERUS FRACTURE  11/25/2010   Procedure: OPEN REDUCTION INTERNAL FIXATION (ORIF) PROXIMAL  HUMERUS FRACTURE;  Surgeon: Fuller Canada, MD;  Location: [facility];  Service: Orthopedics;  Laterality: Right;  . SHOULDER SURGERY     RIGHT shoulder open treatment internal fixation with locking plate. Date of surgery November 25, 2010     OB History   None      Home Medications    Prior to Admission medications   Medication Sig Start Date End Date Taking? Authorizing Provider  amLODipine (NORVASC) 5 MG tablet Take 5 mg by mouth daily.   Yes [provider]  gabapentin (NEURONTIN) 100 MG capsule Take 1 capsule (100 mg total) by mouth 3 (three) times daily. 07/17/13  Yes Vickki Hearing, MD  losartan (COZAAR) 100 MG tablet Take 100 mg by mouth daily. 01/29/13  Yes [provider]  metoprolol tartrate (LOPRESSOR) 25 MG tablet Take 25 mg by mouth 2 (two) times daily.     Yes [provider]  sertraline (ZOLOFT) 50 MG tablet Take 50 mg by mouth every morning.   Yes [provider]  timolol (TIMOPTIC) 0.25 % ophthalmic solution Place 1 drop into the right eye at bedtime.   Yes [provider]  albuterol (PROVENTIL HFA;VENTOLIN HFA) 108 (90 Base) MCG/ACT inhaler Inhale 1-2 puffs into the lungs every 6 (six) hours as needed for wheezing or shortness of breath.    [provider]  amoxicillin (AMOXIL) 500 MG  capsule  04/02/17   [provider]  clindamycin (CLEOCIN) 300 MG capsule Take 1 capsule (300 mg total) by mouth 3 (three) times daily. Patient not taking: Reported on 06/25/2017 05/28/15   Kari Baars, MD  ferrous sulfate (FERROUSUL) 325 (65 FE) MG tablet Take 1 tablet (325 mg total) by mouth daily with breakfast. 06/27/14   Kari Baars, MD  furosemide (LASIX) 40 MG tablet Take 40 mg by mouth daily as needed for fluid.  01/29/13   [provider]  furosemide (LASIX) 40 MG tablet Take 40 mg by mouth daily as needed.     [provider]  LORazepam (ATIVAN) 0.5 MG tablet Take 1 tablet (0.5 mg total) by mouth  every 4 (four) hours as needed for anxiety. 05/28/15   Kari Baars, MD  midodrine (PROAMATINE) 2.5 MG tablet Take 1 tablet (2.5 mg total) by mouth 2 (two) times daily with a meal. 05/28/15   Kari Baars, MD  moxifloxacin (VIGAMOX) 0.5 % ophthalmic solution Place 1 drop into the left eye 2 (two) times daily.     [provider]  nystatin (MYCOSTATIN/NYSTOP) 100000 UNIT/GM POWD Apply 1 g topically 2 (two) times daily. 05/28/15   Kari Baars, MD  predniSONE (DELTASONE) 10 MG tablet Take 4 tablets (40 mg total) by mouth daily with breakfast. 05/30/15   Kari Baars, MD  predniSONE (DELTASONE) 20 MG tablet Take 2 tablets (40 mg total) by mouth daily with breakfast. 05/30/15   Kari Baars, MD  PROAIR HFA 108 (90 BASE) MCG/ACT inhaler Inhale 2 puffs into the lungs every 6 (six) hours as needed for wheezing or shortness of breath.  01/04/13   [provider]    Family History Family History  Problem Relation Age of Onset  . Heart disease Unknown   . Colon cancer Neg Hx     Social History Social History   Tobacco Use  . Smoking status: Never Smoker  Substance Use Topics  . Alcohol use: No    Frequency: Never  . Drug use: No     Allergies   Patient has no known allergies.   Review of Systems Review of Systems  Unable to perform ROS: Mental status change     Physical Exam Updated Vital Signs BP (!) 135/57   Pulse 76   Temp 98.5 F (36.9 C) (Rectal)   Resp (!) 22   Ht 5\' 6"  (1.676 m)   Wt 59 kg (130 lb)   SpO2 97%   BMI 20.98 kg/m   Physical Exam  Constitutional: She appears well-developed.  HENT:  Head: Atraumatic.  Eyes:  Disconjugate gaze, chronic.  Neck: Neck supple.  Cardiovascular: Normal rate.  Pulmonary/Chest:  Mildly scattered wheezes bilaterally.  Abdominal: There is no tenderness.  Musculoskeletal: She exhibits no edema.  Neurological:  Patient is nonverbal.  Chronically weak on left side.  Will not speak for me.  Skin:  Capillary refill takes less than 2 seconds.     ED Treatments / Results  Labs (all labs ordered are listed, but only abnormal results are displayed) Labs Reviewed  CBC WITH DIFFERENTIAL/PLATELET - Abnormal; Notable for the following components:      Result Value   WBC 14.7 (*)    RBC 3.78 (*)    Hemoglobin 10.0 (*)    HCT 33.0 (*)    RDW 20.3 (*)    Platelets 404 (*)    Neutro Abs 11.5 (*)    Monocytes Absolute 1.5 (*)    All other  components within normal limits  COMPREHENSIVE METABOLIC PANEL - Abnormal; Notable for the following components:   Glucose, Bld 115 (*)    BUN 37 (*)    Creatinine, Ser 1.04 (*)    Albumin 3.0 (*)    GFR calc non Af Amer 45 (*)    GFR calc Af Amer 52 (*)    All other components within normal limits  URINALYSIS, ROUTINE W REFLEX MICROSCOPIC - Abnormal; Notable for the following components:   Ketones, ur 5 (*)    All other components within normal limits  TROPONIN I - Abnormal; Notable for the following components:   Troponin I 0.03 (*)    All other components within normal limits  LIPASE, BLOOD    EKG EKG Interpretation  Date/Time:  Saturday June 25 2017 17:25:18 EDT Ventricular Rate:  70 PR Interval:    QRS Duration: 93 QT Interval:  396 QTC Calculation: 428 R Axis:   76 Text Interpretation:  Sinus rhythm Abnormal R-wave progression, early transition Inferior infarct, age indeterminate Confirmed by Benjiman CorePickering, Cleotha Whalin [phone]([id number]) on 06/25/2017 5:41:28 PM   Radiology Dg Chest Portable 1 View  Result Date: 06/25/2017 CLINICAL DATA:  pt being brought in for evaluation related to UTI and congested cough, pt held for 2nd view EXAM: PORTABLE CHEST 1 VIEW COMPARISON:  05/28/2015 FINDINGS: Cardiac silhouette is normal in size. No convincing mediastinal or hilar masses. There is opacity at the right lung base partly silhouetting the lateral right hemidiaphragm. This may be due to atelectasis or pneumonia. Prominent bronchovascular markings are noted  bilaterally with no other areas of consolidation. No convincing pleural effusion.  No pneumothorax. Skeletal structures are demineralized. There partly imaged changes from the reduction of the right humeral fracture, stable. IMPRESSION: 1. Right lung base opacity consistent with pneumonia or atelectasis. No evidence of pulmonary edema. Electronically Signed   By: Amie Portlandavid  Ormond M.D.   On: 06/25/2017 17:42    Procedures Procedures (including critical care time)  Medications Ordered in ED Medications  cefTRIAXone (ROCEPHIN) 1 g in sodium chloride 0.9 % 100 mL IVPB (has no administration in time range)  azithromycin (ZITHROMAX) 500 mg in sodium chloride 0.9 % 250 mL IVPB (has no administration in time range)     Initial Impression / Assessment and Plan / ED Course  I have reviewed the triage vital signs and the nursing notes.  Pertinent labs & imaging results that were available during my care of the patient were reviewed by me and considered in my medical decision making (see chart for details).    Patient presents after mental status changes.  Has had a cough and there was thought to be UTI.  Has had increased confusion at home.  Confused now and is minimally verbal for me.  Reportedly is normally speaks more.  X-ray showed possible pneumonia.  Lab work does show elevated white count.  With mental status changes and potential pneumonia  I think patient would benefit from admission for IV antibiotics.  Patient had some vomiting at home also.   Final Clinical Impressions(s) / ED Diagnoses   Final diagnoses:  Community acquired pneumonia, unspecified laterality  Encephalopathy    ED Discharge Orders    None       Benjiman CorePickering, Mahamadou Weltz, MD 06/25/17 2119

## 2017-06-25 NOTE — ED Notes (Addendum)
Date and time results received: 06/25/17 1812   Test: Troponin Critical Value: 0.03  Name of Provider Notified: Dr. Rubin PayorPickering  Orders Received? Or Actions Taken?: No new orders given.

## 2017-06-26 LAB — CBC
HCT: 34 % — ABNORMAL LOW (ref 36.0–46.0)
HEMOGLOBIN: 10 g/dL — AB (ref 12.0–15.0)
MCH: 25.8 pg — AB (ref 26.0–34.0)
MCHC: 29.4 g/dL — ABNORMAL LOW (ref 30.0–36.0)
MCV: 87.9 fL (ref 78.0–100.0)
PLATELETS: 424 10*3/uL — AB (ref 150–400)
RBC: 3.87 MIL/uL (ref 3.87–5.11)
RDW: 20.3 % — ABNORMAL HIGH (ref 11.5–15.5)
WBC: 13.2 10*3/uL — AB (ref 4.0–10.5)

## 2017-06-26 LAB — LACTIC ACID, PLASMA: Lactic Acid, Venous: 1 mmol/L (ref 0.5–1.9)

## 2017-06-26 LAB — TROPONIN I
Troponin I: 0.05 ng/mL (ref <0.03)
Troponin I: 0.04 ng/mL (ref <0.03)

## 2017-06-26 MED ORDER — SODIUM CHLORIDE 0.9 % IV SOLN
3.0000 g | Freq: Two times a day (BID) | INTRAVENOUS | Status: DC
  Administered 2017-06-26 – 2017-06-29 (×7): 3 g via INTRAVENOUS
  Filled 2017-06-26 (×9): qty 3

## 2017-06-26 MED ORDER — IPRATROPIUM-ALBUTEROL 0.5-2.5 (3) MG/3ML IN SOLN
3.0000 mL | Freq: Three times a day (TID) | RESPIRATORY_TRACT | Status: DC
  Administered 2017-06-27 – 2017-06-29 (×7): 3 mL via RESPIRATORY_TRACT
  Filled 2017-06-26 (×6): qty 3

## 2017-06-26 NOTE — Evaluation (Signed)
Clinical/Bedside Swallow Evaluation Patient Details  Name: Carla Bonilla MRN: [id number] Date of Birth: 1920/07/18  Today's Date: 06/26/2017 Time: SLP Start Time (ACUTE ONLY): 1608 SLP Stop Time (ACUTE ONLY): 1641 SLP Time Calculation (min) (ACUTE ONLY): 33 min  Past Medical History:  Past Medical History:  Diagnosis Date  . Blind left eye   . CVA (cerebral vascular accident) (HCC)    speech problem  . Glaucoma   . Hearing loss   . Hyperlipidemia   . Hypertension   . Neuropathy   . Stroke [facility](HCC)    Past Surgical History:  Past Surgical History:  Procedure Laterality Date  . EYE SURGERY     left  . ORIF HUMERUS FRACTURE  11/25/2010   Procedure: OPEN REDUCTION INTERNAL FIXATION (ORIF) PROXIMAL HUMERUS FRACTURE;  Surgeon: Fuller CanadaStanley Harrison, MD;  Location: [facility];  Service: Orthopedics;  Laterality: Right;  . SHOULDER SURGERY     RIGHT shoulder open treatment internal fixation with locking plate. Date of surgery November 25, 2010   HPI:  Carla GripJean M Easterwoodis a 84 y.o.femalewith medical history significantfor CVA- with left sided deficits, HTN, spinal stenosis.History was obtained mostlyfrom chart review,as patient's is confused as at the time of my evaluation. Attempted contacting Care giver- got voice mail.Patient was brought to the ED today by family with complaints of confusion,and congestedcough.Also noted nausea vomiting and foul-smelling urine.Family notes patient coughing after vomiting. NOTE CXR reveals: "Right lung base opacity consistent with pneumonia or atelectasis.No evidence of pulmonary edema."   Assessment / Plan / Recommendation Clinical Impression  Clinical bedside swallowing evaluation completed while seated upright in the bed. Pt has hx of CVA (2009) with residual oral apraxia, dysphagia and aphasia. Pt's vocal quality was wet at baseline, note edentulous status (caregiver reports Pt has dentures at home). Pt was presented thin liquids and demonstrated  severe wet vocal quality and delayed wet cough; suspect delayed swallow trigger and decreased sensation. With NTL Pt continued to demonstrate wet vocal quality and a delayed cough. Pt tolerated tsp sips of HTL with no overt s/sx of aspiration. Pt consumed puree textures with poor awareness of bolus and continued attempt to talk while bolus was still in oral cavity. Pt is unable to consistently follow strategies secondary to mentation. Recommend initiate D1/puree diet and HONEY THICK liquids. Recommend meds to be administered crushed in puree. Pt requires 1:1 feeder for all meals and should only be fed while alert and seated upright in bed. Encourage pt to attempt to self feed (hand over hand) to improve awareness of bolus with liquids and solids. Recommend MBS to objectively assess the swallow and to determine LRD. Pt's caregiver arrived towards the end of evaluation and was educated of diet recommendation and universal aspiration precautions. ST will continue to follow while in acute setting. SLP Visit Diagnosis: Dysphagia, unspecified (R13.10)    Aspiration Risk  Moderate aspiration risk;Severe aspiration risk    Diet Recommendation Dysphagia 1 (Puree);Honey-thick liquid   Liquid Administration via: Cup Medication Administration: Crushed with puree Supervision: Full supervision/cueing for compensatory strategies Compensations: Minimize environmental distractions;Slow rate;Small sips/bites;Follow solids with liquid Postural Changes: Seated upright at 90 degrees;Remain upright for at least 30 minutes after po intake    Other  Recommendations Oral Care Recommendations: Oral care BID Other Recommendations: Order thickener from pharmacy   Follow up Recommendations Skilled Nursing facility;24 hour supervision/assistance      Frequency and Duration min 1 x/week  1 week       Prognosis Prognosis for Safe Diet  Advancement: Fair Barriers to Reach Goals: Cognitive deficits;Severity of deficits       Swallow Study   General Date of Onset: 06/25/17 HPI: Carla Bonilla a 84 y.o.femalewith medical history significantfor CVA- with left sided deficits, HTN, spinal stenosis.History was obtained mostlyfrom chart review,as patient's is confused as at the time of my evaluation. Attempted contacting Care giver- got voice mail.Patient was brought to the ED today by family with complaints of confusion,and congestedcough.Also noted nausea vomiting and foul-smelling urine.Family notes patient coughing after vomiting. Right lung base opacity consistent with pneumonia or atelectasis.No evidence of pulmonary edema. Type of Study: Bedside Swallow Evaluation Previous Swallow Assessment: MBS 2017 Diet Prior to this Study: NPO Temperature Spikes Noted: No Respiratory Status: Nasal cannula History of Recent Intubation: No Behavior/Cognition: Alert;Cooperative;Pleasant mood;Distractible;Requires cueing;Doesn't follow directions Oral Cavity Assessment: Within Functional Limits Oral Care Completed by SLP: Recent completion by staff Oral Cavity - Dentition: Edentulous Vision: Impaired for self-feeding Self-Feeding Abilities: Total assist Patient Positioning: Upright in bed Baseline Vocal Quality: Wet Volitional Cough: Cognitively unable to elicit Volitional Swallow: Unable to elicit    Oral/Motor/Sensory Function Overall Oral Motor/Sensory Function: Mild impairment(impaired from 2009 CVA)   Ice Chips Ice chips: Impaired Presentation: Spoon Oral Phase Impairments: Poor awareness of bolus Pharyngeal Phase Impairments: Multiple swallows;Wet Vocal Quality   Thin Liquid Thin Liquid: Impaired Presentation: Cup Oral Phase Impairments: Poor awareness of bolus Oral Phase Functional Implications: Oral residue Pharyngeal  Phase Impairments: Suspected delayed Swallow;Multiple swallows;Wet Vocal Quality;Cough - Delayed    Nectar Thick Nectar Thick Liquid: Impaired Presentation: Cup Oral Phase  Impairments: Poor awareness of bolus Oral phase functional implications: Prolonged oral transit Pharyngeal Phase Impairments: Suspected delayed Swallow;Multiple swallows;Wet Vocal Quality;Cough - Delayed   Honey Thick Honey Thick Liquid: Within functional limits   Puree Puree: Impaired Presentation: Spoon Oral Phase Impairments: Reduced lingual movement/coordination;Poor awareness of bolus Oral Phase Functional Implications: Prolonged oral transit Pharyngeal Phase Impairments: Suspected delayed Swallow;Multiple swallows   Solid   Quante Pettry H. Romie Levee, CCC-SLP Speech Language Pathologist    Solid: Not tested        Carla Bonilla 06/26/2017,4:47 PM

## 2017-06-26 NOTE — Progress Notes (Signed)
ANTIBIOTIC CONSULT NOTE-Preliminary  Pharmacy Consult for Unasyn Indication: Aspiration Pneumonia  No Known Allergies  Patient Measurements: Height: 5\' 6"  (167.6 cm) Weight: 116 lb 10 oz (52.9 kg) IBW/kg (Calculated) : 59.3   Vital Signs: Temp: 98.8 F (37.1 C) (04/06 2259) Temp Source: Oral (04/06 2259) BP: 148/72 (04/06 2259) Pulse Rate: 88 (04/06 2259)  Labs: Recent Labs    06/25/17 1725  WBC 14.7*  HGB 10.0*  PLT 404*  CREATININE 1.04*    Estimated Creatinine Clearance: 27.6 mL/min (A) (by C-G formula based on SCr of 1.04 mg/dL (H)).  No results for input(s): VANCOTROUGH, VANCOPEAK, VANCORANDOM, GENTTROUGH, GENTPEAK, GENTRANDOM, TOBRATROUGH, TOBRAPEAK, TOBRARND, AMIKACINPEAK, AMIKACINTROU, AMIKACIN in the last 72 hours.   Microbiology: No results found for this or any previous visit (from the past 720 hour(s)).  Medical History: Past Medical History:  Diagnosis Date  . Blind left eye   . CVA (cerebral vascular accident) (HCC)    speech problem  . Glaucoma   . Hearing loss   . Hyperlipidemia   . Hypertension   . Neuropathy   . Stroke [facility](HCC)     Medications:  Ceftriaxone 1 Gm IV x 1 ED dose Azithromycin 500 mg IV x 1 ED dose  Assessment: 84 yo female brought to the ED by family for complaints of confusion, cough, N/V and foul smelling urine. Chest x-ray shows possible pneumonia. Pharmacy has been asked to provide dosing for aspiration pneumonia.  Goal of Therapy:  Eradicate infection  Plan:  Preliminary review of pertinent patient information completed.  Protocol will be initiated with 1 dose( of Unasyn 3 Gm IV.  Jeani HawkingAnnie Penn clinical pharmacist will complete review during morning rounds to assess patient and finalize treatment regimen if needed.  Arelia SneddonMason, Syrai Gladwin Anne, [facility] - La MiradaRPH 06/26/2017,12:07 AM

## 2017-06-26 NOTE — Progress Notes (Signed)
CRITICAL VALUE ALERT  Critical Value:  Troponin 0.05  Date & Time Notied:  06/26/17 0415  Provider Notified: Toniann FailKakrakandy   Orders Received/Actions taken: None at this time

## 2017-06-26 NOTE — Progress Notes (Signed)
Subjective: She was admitted with community-acquired pneumonia and altered mental status from that.  She is still very sleepy.  Objective: Vital signs in last 24 hours: Temp:  [98.5 F (36.9 C)-98.8 F (37.1 C)] 98.6 F (37 C) (04/07 0520) Pulse Rate:  [71-96] 83 (04/07 0520) Resp:  [20-26] 26 (04/06 2200) BP: (119-150)/(51-72) 144/56 (04/07 0520) SpO2:  [90 %-100 %] 90 % (04/07 0750) Weight:  [52.9 kg (116 lb 10 oz)-59 kg (130 lb)] 52.9 kg (116 lb 10 oz) (04/06 2257) Weight change:  Last BM Date: 06/25/17  Intake/Output from previous day: 04/06 0701 - 04/07 0700 In: 450 [IV Piggyback:450] Out: 1000 [Urine:1000]  PHYSICAL EXAM General appearance: Sleepy arousable confused Resp: rhonchi bilaterally Cardio: regular rate and rhythm, S1, S2 normal, no murmur, click, rub or gallop GI: soft, non-tender; bowel sounds normal; no masses,  no organomegaly Extremities: extremities normal, atraumatic, no cyanosis or edema She has severe dysarthria from previous stroke Lab Results:  Results for orders placed or performed during the hospital encounter of 06/25/17 (from the past 48 hour(s))  CBC with Differential     Status: Abnormal   Collection Time: 06/25/17  5:25 PM  Result Value Ref Range   WBC 14.7 (H) 4.0 - 10.5 K/uL   RBC 3.78 (L) 3.87 - 5.11 MIL/uL   Hemoglobin 10.0 (L) 12.0 - 15.0 g/dL   HCT 33.0 (L) 36.0 - 46.0 %   MCV 87.3 78.0 - 100.0 fL   MCH 26.5 26.0 - 34.0 pg   MCHC 30.3 30.0 - 36.0 g/dL   RDW 20.3 (H) 11.5 - 15.5 %   Platelets 404 (H) 150 - 400 K/uL   Neutrophils Relative % 78 %   Neutro Abs 11.5 (H) 1.7 - 7.7 K/uL   Lymphocytes Relative 12 %   Lymphs Abs 1.7 0.7 - 4.0 K/uL   Monocytes Relative 10 %   Monocytes Absolute 1.5 (H) 0.1 - 1.0 K/uL   Eosinophils Relative 0 %   Eosinophils Absolute 0.0 0.0 - 0.7 K/uL   Basophils Relative 0 %   Basophils Absolute 0.0 0.0 - 0.1 K/uL    Comment: Performed at [facility], [address], [place], [place] [address]   Comprehensive metabolic panel     Status: Abnormal   Collection Time: 06/25/17  5:25 PM  Result Value Ref Range   Sodium 143 135 - 145 mmol/L   Potassium 4.7 3.5 - 5.1 mmol/L   Chloride 106 101 - 111 mmol/L   CO2 25 22 - 32 mmol/L   Glucose, Bld 115 (H) 65 - 99 mg/dL   BUN 37 (H) 6 - 20 mg/dL   Creatinine, Ser 1.04 (H) 0.44 - 1.00 mg/dL   Calcium 8.9 8.9 - 10.3 mg/dL   Total Protein 7.1 6.5 - 8.1 g/dL   Albumin 3.0 (L) 3.5 - 5.0 g/dL   AST 20 15 - 41 U/L   ALT 15 14 - 54 U/L   Alkaline Phosphatase 83 38 - 126 U/L   Total Bilirubin 0.8 0.3 - 1.2 mg/dL   GFR calc non Af Amer 45 (L) >60 mL/min   GFR calc Af Amer 52 (L) >60 mL/min    Comment: (NOTE) The eGFR has been calculated using the CKD EPI equation. This calculation has not been validated in all clinical situations. eGFR's persistently <60 mL/min signify possible Chronic Kidney Disease.    Anion gap 12 5 - 15    Comment: Performed at [facility], [address], [place], [place]  [address]  Lipase, blood     Status: None   Collection Time: 06/25/17  5:25 PM  Result Value Ref Range   Lipase 31 11 - 51 U/L    Comment: Performed at [facility], [address] [place], [place] [address]  Troponin I     Status: Abnormal   Collection Time: 06/25/17  5:25 PM  Result Value Ref Range   Troponin I 0.03 (HH) <0.03 ng/mL    Comment: CRITICAL RESULT CALLED TO, READ BACK BY AND VERIFIED WITH: KENDRICK,J. AT 3291 ON 06/24/2017 BY EVA Performed at [facility], [address], [place], [place] [address]   Urinalysis, Routine w reflex microscopic     Status: Abnormal   Collection Time: 06/25/17  6:12 PM  Result Value Ref Range   Color, Urine YELLOW YELLOW   APPearance CLEAR CLEAR   Specific Gravity, Urine 1.018 1.005 - 1.030   pH 5.0 5.0 - 8.0   Glucose, UA NEGATIVE NEGATIVE mg/dL   Hgb urine dipstick NEGATIVE NEGATIVE   Bilirubin Urine NEGATIVE NEGATIVE   Ketones, ur 5 (A) NEGATIVE mg/dL   Protein, ur NEGATIVE NEGATIVE mg/dL    Nitrite NEGATIVE NEGATIVE   Leukocytes, UA NEGATIVE NEGATIVE    Comment: Performed at [facility], [address], [place], [place] [address]  Culture, blood (routine x 2)     Status: None (Preliminary result)   Collection Time: 06/26/17  2:40 AM  Result Value Ref Range   Specimen Description RIGHT ANTECUBITAL    Special Requests      BOTTLES DRAWN AEROBIC AND ANAEROBIC Blood Culture adequate volume   Culture      NO GROWTH < 12 HOURS Performed at [facility], [address] [place], [place] [address]    Report Status PENDING   Lactic acid, plasma     Status: None   Collection Time: 06/26/17  2:40 AM  Result Value Ref Range   Lactic Acid, Venous 1.0 0.5 - 1.9 mmol/L    Comment: Performed at [facility], [address] [place], [place] [address]  Troponin I     Status: Abnormal   Collection Time: 06/26/17  2:41 AM  Result Value Ref Range   Troponin I 0.05 (HH) <0.03 ng/mL    Comment: CRITICAL RESULT CALLED TO, READ BACK BY AND VERIFIED WITH: HARRIS,B @ 0417 ON 06/26/17 BY JUW Performed at [facility], [address] [place], [place] [address]   CBC     Status: Abnormal   Collection Time: 06/26/17  2:41 AM  Result Value Ref Range   WBC 13.2 (H) 4.0 - 10.5 K/uL   RBC 3.87 3.87 - 5.11 MIL/uL   Hemoglobin 10.0 (L) 12.0 - 15.0 g/dL   HCT 34.0 (L) 36.0 - 46.0 %   MCV 87.9 78.0 - 100.0 fL   MCH 25.8 (L) 26.0 - 34.0 pg   MCHC 29.4 (L) 30.0 - 36.0 g/dL   RDW 20.3 (H) 11.5 - 15.5 %   Platelets 424 (H) 150 - 400 K/uL    Comment: Performed at [facility], [address], [place], [place] [address]  Culture, blood (routine x 2)     Status: None (Preliminary result)   Collection Time: 06/26/17  5:22 AM  Result Value Ref Range   Specimen Description RIGHT ANTECUBITAL    Special Requests      BOTTLES DRAWN AEROBIC AND ANAEROBIC Blood Culture adequate volume Performed at [facility], [address] [place], [place] [address]    Culture PENDING  Report Status PENDING     ABGS No  results for input(s): PHART, PO2ART, TCO2, HCO3 in the last 72 hours.  Invalid input(s): PCO2 CULTURES Recent Results (from the past 240 hour(s))  Culture, blood (routine x 2)     Status: None (Preliminary result)   Collection Time: 06/26/17  2:40 AM  Result Value Ref Range Status   Specimen Description RIGHT ANTECUBITAL  Final   Special Requests   Final    BOTTLES DRAWN AEROBIC AND ANAEROBIC Blood Culture adequate volume   Culture   Final    NO GROWTH < 12 HOURS Performed at [facility], [address] [place], [place] [address]    Report Status PENDING  Incomplete  Culture, blood (routine x 2)     Status: None (Preliminary result)   Collection Time: 06/26/17  5:22 AM  Result Value Ref Range Status   Specimen Description RIGHT ANTECUBITAL  Final   Special Requests   Final    BOTTLES DRAWN AEROBIC AND ANAEROBIC Blood Culture adequate volume Performed at [facility], [address] [place], [place] [address]    Culture PENDING  Incomplete   Report Status PENDING  Incomplete   Studies/Results: Dg Chest Portable 1 View  Result Date: 06/25/2017 CLINICAL DATA:  pt being brought in for evaluation related to UTI and congested cough, pt held for 2nd view EXAM: PORTABLE CHEST 1 VIEW COMPARISON:  05/28/2015 FINDINGS: Cardiac silhouette is normal in size. No convincing mediastinal or hilar masses. There is opacity at the right lung base partly silhouetting the lateral right hemidiaphragm. This may be due to atelectasis or pneumonia. Prominent bronchovascular markings are noted bilaterally with no other areas of consolidation. No convincing pleural effusion.  No pneumothorax. Skeletal structures are demineralized. There partly imaged changes from the reduction of the right humeral fracture, stable. IMPRESSION: 1. Right lung base opacity consistent with pneumonia or atelectasis. No evidence of pulmonary edema. Electronically Signed   By: Lajean Manes M.D.   On: 06/25/2017 17:42    Medications:   Prior to Admission:  Medications Prior to Admission  Medication Sig Dispense Refill Last Dose  . amLODipine (NORVASC) 5 MG tablet Take 5 mg by mouth daily.   06/25/2017 at Unknown time  . gabapentin (NEURONTIN) 100 MG capsule Take 1 capsule (100 mg total) by mouth 3 (three) times daily. 90 capsule 5 06/25/2017 at Unknown time  . losartan (COZAAR) 100 MG tablet Take 100 mg by mouth daily.   06/25/2017 at Unknown time  . metoprolol tartrate (LOPRESSOR) 25 MG tablet Take 25 mg by mouth 2 (two) times daily.     06/25/2017 at 8am  . sertraline (ZOLOFT) 50 MG tablet Take 50 mg by mouth every morning.   06/25/2017 at Unknown time  . timolol (TIMOPTIC) 0.25 % ophthalmic solution Place 1 drop into the right eye at bedtime.   06/24/2017 at Unknown time  . albuterol (PROVENTIL HFA;VENTOLIN HFA) 108 (90 Base) MCG/ACT inhaler Inhale 1-2 puffs into the lungs every 6 (six) hours as needed for wheezing or shortness of breath.   unknown  . ferrous sulfate (FERROUSUL) 325 (65 FE) MG tablet Take 1 tablet (325 mg total) by mouth daily with breakfast. 30 tablet 3 05/24/2015 at Unknown time  . furosemide (LASIX) 40 MG tablet Take 40 mg by mouth daily as needed for fluid.    Past Week at Unknown time  . furosemide (LASIX) 40 MG tablet Take 40 mg by mouth daily as needed.    unknown  .  LORazepam (ATIVAN) 0.5 MG tablet Take 1 tablet (0.5 mg total) by mouth every 4 (four) hours as needed for anxiety. 30 tablet 0   . midodrine (PROAMATINE) 2.5 MG tablet Take 1 tablet (2.5 mg total) by mouth 2 (two) times daily with a meal.     . moxifloxacin (VIGAMOX) 0.5 % ophthalmic solution Place 1 drop into the left eye 2 (two) times daily.    05/24/2015 at Unknown time  . nystatin (MYCOSTATIN/NYSTOP) 100000 UNIT/GM POWD Apply 1 g topically 2 (two) times daily.  0   . predniSONE (DELTASONE) 10 MG tablet Take 4 tablets (40 mg total) by mouth daily with breakfast.     . predniSONE (DELTASONE) 20 MG tablet Take 2 tablets (40 mg total) by mouth daily with  breakfast.     . PROAIR HFA 108 (90 BASE) MCG/ACT inhaler Inhale 2 puffs into the lungs every 6 (six) hours as needed for wheezing or shortness of breath.    unknown   Scheduled: . amLODipine  5 mg Oral Daily  . enoxaparin (LOVENOX) injection  40 mg Subcutaneous Q24H  . erythromycin   Right Eye Q8H  . ipratropium-albuterol  3 mL Nebulization Q6H  . losartan  100 mg Oral Daily  . mouth rinse  15 mL Mouth Rinse BID  . metoprolol tartrate  25 mg Oral BID  . sertraline  50 mg Oral q morning - 10a  . timolol  1 drop Right Eye QHS   Continuous: . azithromycin     ZYS:[id number], ondansetron **OR** ondansetron (ZOFRAN) IV, polyethylene glycol  Assesment: She has community-acquired pneumonia likely from aspiration.  She had systemic inflammatory response syndrome but seems better with that  She has had altered mental status likely from a combination of her acute illness plus the previous stroke  She has had 2 strokes and has severe dysarthria at baseline  She has hypertension which is controlled Principal Problem:   CAP (community acquired pneumonia) Active Problems:   H/O: stroke with residual effects   Hypertension    Plan: Continue current treatments I will investigate CODE STATUS because I believe that she had requested no CODE BLUE previously    LOS: 1 day   Heran Campau L 06/26/2017, 7:58 AM

## 2017-06-26 NOTE — Progress Notes (Signed)
Caregiver fed patient supper and said that after eating a couple of bites patient vomited food back.  She said that she had done the same thing for several days prior to admission.  Texted Dr. Ardyth HarpsHernandez.

## 2017-06-26 NOTE — Progress Notes (Addendum)
Caregiver at bedside this morning and said that patient seemed to be at her baseline mentation and alertness this morning.  NPO order was for NPO until this happened.  Did bedside swallow and did fine with applesauce and swallowed whole pills with no difficulty but then when drinking water choked.  Contacted Dr. Ardyth HarpsHernandez and told her she did fine with applesauice and order was to keep NPO status but may give crushed pills in applesauce..  Patient is now alert and talking but words are unintelligible  Caregiver is not here now.  Bed alarm is set.

## 2017-06-26 NOTE — Progress Notes (Signed)
Pharmacy Antibiotic Note  Carla Bonilla is a 84 y.o. female admitted on 06/25/2017 with pneumonia.  Pharmacy has been consulted for unasyn dosing.  Plan: unasyn 3gm IV q12h Also on zithromax 500mg  IV q24h F/U cxs and clinical progress Monitor V/S, labs  Height: 5\' 6"  (167.6 cm) Weight: 116 lb 10 oz (52.9 kg) IBW/kg (Calculated) : 59.3  Temp (24hrs), Avg:98.6 F (37 C), Min:98.5 F (36.9 C), Max:98.8 F (37.1 C)  Recent Labs  Lab 06/25/17 1725 06/26/17 0240 06/26/17 0241  WBC 14.7*  --  13.2*  CREATININE 1.04*  --   --   LATICACIDVEN  --  1.0  --     Estimated Creatinine Clearance: 27.6 mL/min (A) (by C-G formula based on SCr of 1.04 mg/dL (H)).    No Known Allergies  Antimicrobials this admission: Unasyn 4/7>>  Azithromycin 4/6 >>  Ceftriaxone x 1 dose 4/6  Dose adjustments this admission: N/A  Microbiology results: 4/7 BCx: pending  Thank you for allowing pharmacy to be a part of this patient's care.  Elder CyphersLorie Paublo Warshawsky, BS Pharm D, [place] Clinical Pharmacist Pager [phone] 06/26/2017 8:39 AM

## 2017-06-27 ENCOUNTER — Inpatient Hospital Stay (HOSPITAL_COMMUNITY): Payer: Medicare Other

## 2017-06-27 MED ORDER — ENOXAPARIN SODIUM 30 MG/0.3ML ~~LOC~~ SOLN
30.0000 mg | SUBCUTANEOUS | Status: DC
  Administered 2017-06-27 – 2017-06-28 (×2): 30 mg via SUBCUTANEOUS
  Filled 2017-06-27 (×2): qty 0.3

## 2017-06-27 NOTE — Plan of Care (Signed)
  Problem: Acute Rehab PT Goals(only PT should resolve) Goal: Pt Will Go Supine/Side To Sit Outcome: Progressing Flowsheets (Taken 06/27/2017 1126) Pt will go Supine/Side to Sit: with moderate assist Goal: Patient Will Transfer Sit To/From Stand Outcome: Progressing Flowsheets (Taken 06/27/2017 1126) Patient will transfer sit to/from stand: with moderate assist Goal: Pt Will Transfer Bed To Chair/Chair To Bed Outcome: Progressing Flowsheets (Taken 06/27/2017 1126) Pt will Transfer Bed to Chair/Chair to Bed: with mod assist   11:27 AM, 06/27/17 Ocie BobJames Evan Mackie, MPT Physical Therapist with [facility] [phone] office [phone] mobile phone

## 2017-06-27 NOTE — Care Management Important Message (Signed)
Important Message  Patient Details  Name: Carla Bonilla MRN: [id number] Date of Birth: [birth year]   Medicare Important Message Given:  Yes    Renie OraHawkins, Dustan Hyams Smith 06/27/2017, 12:54 PM

## 2017-06-27 NOTE — Evaluation (Signed)
Physical Therapy Evaluation Patient Details Name: Carla Bonilla MRN: [id number] DOB: Aug 06, 1920 Today's Date: 06/27/2017   History of Present Illness  Carla Bonilla is a 84 y.o. female with medical history significant for CVA- with left sided deficits, HTN, spinal stenosis.  History was obtained mostly from chart review, as patient's is confused as at the time of my evaluation. Attempted contacting Care giver- got voice mail. Patient was brought to the ED today by family with complaints of confusion, and congested cough.  Also noted nausea vomiting and foul-smelling urine.  Family notes patient coughing after vomiting.    Clinical Impression  Patient limited for standing and transfers due to BLE weakness and c/o pain with movement of legs (see below).  Patient tolerated sitting up in chair and later transferred to gurney to be taken to a procedure.  Patient will benefit from continued physical therapy in hospital and recommended venue below to increase strength, balance, endurance for safe ADLs and gait.    Follow Up Recommendations SNF;Supervision/Assistance - 24 hour    Equipment Recommendations  None recommended by PT    Recommendations for Other Services       Precautions / Restrictions Precautions Precautions: Fall Restrictions Weight Bearing Restrictions: No      Mobility  Bed Mobility Overal bed mobility: Needs Assistance Bed Mobility: Supine to Sit;Sit to Supine     Supine to sit: Max assist Sit to supine: Max assist      Transfers Overall transfer level: Needs assistance Equipment used: 1 person hand held assist Transfers: Sit to/from UGI CorporationStand;Stand Pivot Transfers Sit to Stand: Max assist Stand pivot transfers: Max assist       General transfer comment: RLE appears spastic and held behind LLE  Ambulation/Gait                Stairs            Wheelchair Mobility    Modified Rankin (Stroke Patients Only)       Balance Overall  balance assessment: Needs assistance Sitting-balance support: Feet supported;Bilateral upper extremity supported Sitting balance-Leahy Scale: Poor   Postural control: Right lateral lean Standing balance support: During functional activity;No upper extremity supported Standing balance-Leahy Scale: Poor Standing balance comment: with hand held assist                             Pertinent Vitals/Pain Pain Assessment: Faces Faces Pain Scale: Hurts even more Pain Location: with movement of legs Pain Descriptors / Indicators: Grimacing;Guarding Pain Intervention(s): Limited activity within patient's tolerance;Monitored during session    Home Living Family/patient expects to be discharged to:: Private residence Living Arrangements: Non-relatives/Friends Available Help at Discharge: Personal care attendant(has caregivers 24 hours/day) Type of Home: House Home Access: Level entry     Home Layout: One level Home Equipment: Wheelchair - Fluor Corporationmanual;Walker - 2 wheels;Shower seat;Bedside commode      Prior Function Level of Independence: Needs assistance   Gait / Transfers Assistance Needed: Supervised transfers, non ambulatory other than short distances hand held per caregiver  ADL's / Homemaking Assistance Needed: assisted by caregivers 24/7        Hand Dominance   Dominant Hand: Left    Extremity/Trunk Assessment   Upper Extremity Assessment Upper Extremity Assessment: Generalized weakness;RUE deficits/detail;LUE deficits/detail RUE Deficits / Details: grossly -3/5 LUE Deficits / Details: grossly 2/5    Lower Extremity Assessment Lower Extremity Assessment: Generalized weakness;RLE deficits/detail RLE Deficits / Details: grossly 2/5  LLE Deficits / Details: grossly -3/5    Cervical / Trunk Assessment Cervical / Trunk Assessment: Kyphotic  Communication   Communication: Expressive difficulties  Cognition Arousal/Alertness: Awake/alert Behavior During Therapy:  WFL for tasks assessed/performed Overall Cognitive Status: Within Functional Limits for tasks assessed                                        General Comments      Exercises     Assessment/Plan    PT Assessment Patient needs continued PT services  PT Problem List Decreased strength;Decreased range of motion;Decreased activity tolerance;Decreased balance;Decreased mobility       PT Treatment Interventions Therapeutic activities;Therapeutic exercise;Patient/family education;Functional mobility training;Wheelchair mobility training    PT Goals (Current goals can be found in the Care Plan section)  Acute Rehab PT Goals Patient Stated Goal: return home PT Goal Formulation: With patient Time For Goal Achievement: 07/11/17 Potential to Achieve Goals: Fair    Frequency Min 3X/week   Barriers to discharge        Co-evaluation               AM-PAC PT "6 Clicks" Daily Activity  Outcome Measure Difficulty turning over in bed (including adjusting bedclothes, sheets and blankets)?: A Lot Difficulty moving from lying on back to sitting on the side of the bed? : A Lot Difficulty sitting down on and standing up from a chair with arms (e.g., wheelchair, bedside commode, etc,.)?: A Lot Help needed moving to and from a bed to chair (including a wheelchair)?: A Lot Help needed walking in hospital room?: Total Help needed climbing 3-5 steps with a railing? : Total 6 Click Score: 10    End of Session Equipment Utilized During Treatment: Oxygen Activity Tolerance: Patient limited by fatigue;Patient limited by pain Patient left: in chair;with family/visitor present Nurse Communication: Mobility status PT Visit Diagnosis: Unsteadiness on feet (R26.81);Other abnormalities of gait and mobility (R26.89);Muscle weakness (generalized) (M62.81)    Time: 0272-5366 PT Time Calculation (min) (ACUTE ONLY): 31 min   Charges:   PT Evaluation $PT Eval Moderate Complexity: 1  Mod PT Treatments $Therapeutic Activity: 23-37 mins   PT G Codes:        11:20 AM, July 08, 2017 Ocie Bob, MPT Physical Therapist with [facility] [phone] office [phone] mobile phone

## 2017-06-27 NOTE — Progress Notes (Signed)
Initial Nutrition Assessment  DOCUMENTATION CODES:   Not applicable  INTERVENTION:   Recommend obtaining a new height reading  Monitor for diet orders and provide medically appropriate nutritional supplement. Pt prefers Glucerna Shake (thickened to appropriate consistency) po BID, each supplement provides 220 kcal and 10 grams of protein  NUTRITION DIAGNOSIS:   Inadequate oral intake related to dysphagia, poor appetite as evidenced by per patient/family report.  GOAL:    Patient will meet greater than or equal to 90% of their needs  MONITOR:   PO intake, Diet advancement, Supplement acceptance, Labs, Weight trends, I & O's  REASON FOR ASSESSMENT:   Low Braden   ASSESSMENT:   Pt with PMH of HLD, HTN, and CVA with L sided deficits presents with pneumonia and metabolic encephalopathy    Spoke with friend/caregiver at pt's bedside. She reports pt has a great appetite at baseline consuming 3 meals and snacks daily. Pt also consumes 2 vanilla protein shakes daily (unclear if Boost or Glucerna)  Since admission, pt has had no oral intake and also does not have denture with her in hospital.   Pt for swallow evaluation (MBS) today at 1100   No reported weight changes for patient, caregiver states all her clothes fit the same and she has always been petite. Limited weight history available per chart. Height discrepancies in chart, ranging from 5'1'' to 5'6''.  Labs reviewed; serum glucose 115, BUN 37, Creatinine 1.04, Albumin 3.0 Medications reviewed   NUTRITION - FOCUSED PHYSICAL EXAM:    Most Recent Value  Orbital Region  No depletion  Upper Arm Region  No depletion  Thoracic and Lumbar Region  No depletion  Buccal Region  No depletion  Temple Region  Mild depletion  Clavicle Bone Region  Mild depletion  Clavicle and Acromion Bone Region  Moderate depletion  Scapular Bone Region  Moderate depletion  Dorsal Hand  Mild depletion  Patellar Region  Unable to assess   Anterior Thigh Region  Unable to assess  Posterior Calf Region  Unable to assess  Edema (RD Assessment)  None     Diet Order:  DIET - DYS 1 Room service appropriate? Yes; Fluid consistency: Honey Thick  EDUCATION NEEDS:   Not appropriate for education at this time  Skin:  Skin Assessment: Reviewed RN Assessment  Last BM:  06/25/17  Height:   Ht Readings from Last 1 Encounters:  06/25/17 5\' 6"  (1.676 m)   Weight:   Wt Readings from Last 1 Encounters:  06/25/17 116 lb 10 oz (52.9 kg)   Ideal Body Weight:  59 kg  BMI:  Body mass index is 18.82 kg/m.  Estimated Nutritional Needs:   Kcal:  1350-1550  Protein:  60-70 grams  Fluid:  >/= 1.5 L/d  Fransisca KaufmannAllison Ioannides, MS, RDN, LDN 06/27/2017 11:26 AM

## 2017-06-27 NOTE — Clinical Social Work Note (Signed)
Tammy, patient's caregiver, stated that at baseline, patient is wheelchair bound. She states that she is with patient daily and that the time frame varies based on patient's needs. She indicated that at this point, she feels that patient can be best cared for in her own home.    Kymari Nuon, Juleen ChinaHeather D, LCSW

## 2017-06-27 NOTE — Progress Notes (Addendum)
Subjective: She had more trouble with swallowing yesterday evening.  She had speech evaluation and it is suggested that she have modified barium swallow which I think is appropriate.  She may have some GI issues because she vomited again.  She is sleepy but arousable this morning.  Seems to be at baseline mental status.  Objective: Vital signs in last 24 hours: Temp:  [98.1 F (36.7 C)-98.5 F (36.9 C)] 98.1 F (36.7 C) (04/08 0500) Pulse Rate:  [69-75] 69 (04/08 0500) Resp:  [16] 16 (04/08 0500) BP: (113-140)/(46-69) 121/69 (04/08 0500) SpO2:  [90 %-98 %] 98 % (04/08 0500) Weight change:  Last BM Date: 06/25/17  Intake/Output from previous day: 04/07 0701 - 04/08 0700 In: 480 [P.O.:30; IV Piggyback:450] Out: 1250 [Urine:1250]  PHYSICAL EXAM General appearance: Sleepy but arousable Resp: rhonchi bilaterally Cardio: regular rate and rhythm, S1, S2 normal, no murmur, click, rub or gallop GI: soft, non-tender; bowel sounds normal; no masses,  no organomegaly Extremities: extremities normal, atraumatic, no cyanosis or edema Speech is very difficult to understand Lab Results:  Results for orders placed or performed during the hospital encounter of 06/25/17 (from the past 48 hour(s))  CBC with Differential     Status: Abnormal   Collection Time: 06/25/17  5:25 PM  Result Value Ref Range   WBC 14.7 (H) 4.0 - 10.5 K/uL   RBC 3.78 (L) 3.87 - 5.11 MIL/uL   Hemoglobin 10.0 (L) 12.0 - 15.0 g/dL   HCT 33.0 (L) 36.0 - 46.0 %   MCV 87.3 78.0 - 100.0 fL   MCH 26.5 26.0 - 34.0 pg   MCHC 30.3 30.0 - 36.0 g/dL   RDW 20.3 (H) 11.5 - 15.5 %   Platelets 404 (H) 150 - 400 K/uL   Neutrophils Relative % 78 %   Neutro Abs 11.5 (H) 1.7 - 7.7 K/uL   Lymphocytes Relative 12 %   Lymphs Abs 1.7 0.7 - 4.0 K/uL   Monocytes Relative 10 %   Monocytes Absolute 1.5 (H) 0.1 - 1.0 K/uL   Eosinophils Relative 0 %   Eosinophils Absolute 0.0 0.0 - 0.7 K/uL   Basophils Relative 0 %   Basophils Absolute 0.0  0.0 - 0.1 K/uL    Comment: Performed at [facility], [address], [place], [place] [address]  Comprehensive metabolic panel     Status: Abnormal   Collection Time: 06/25/17  5:25 PM  Result Value Ref Range   Sodium 143 135 - 145 mmol/L   Potassium 4.7 3.5 - 5.1 mmol/L   Chloride 106 101 - 111 mmol/L   CO2 25 22 - 32 mmol/L   Glucose, Bld 115 (H) 65 - 99 mg/dL   BUN 37 (H) 6 - 20 mg/dL   Creatinine, Ser 1.04 (H) 0.44 - 1.00 mg/dL   Calcium 8.9 8.9 - 10.3 mg/dL   Total Protein 7.1 6.5 - 8.1 g/dL   Albumin 3.0 (L) 3.5 - 5.0 g/dL   AST 20 15 - 41 U/L   ALT 15 14 - 54 U/L   Alkaline Phosphatase 83 38 - 126 U/L   Total Bilirubin 0.8 0.3 - 1.2 mg/dL   GFR calc non Af Amer 45 (L) >60 mL/min   GFR calc Af Amer 52 (L) >60 mL/min    Comment: (NOTE) The eGFR has been calculated using the CKD EPI equation. This calculation has not been validated in all clinical situations. eGFR's persistently <60 mL/min signify possible Chronic Kidney Disease.    Anion gap  12 5 - 15    Comment: Performed at [facility], [address], [place], [place] [address]  Lipase, blood     Status: None   Collection Time: 06/25/17  5:25 PM  Result Value Ref Range   Lipase 31 11 - 51 U/L    Comment: Performed at [facility], [address], [place], [place] [address]  Troponin I     Status: Abnormal   Collection Time: 06/25/17  5:25 PM  Result Value Ref Range   Troponin I 0.03 (HH) <0.03 ng/mL    Comment: CRITICAL RESULT CALLED TO, READ BACK BY AND VERIFIED WITH: KENDRICK,J. AT 0981 ON 06/24/2017 BY EVA Performed at [facility], [address], [place], [place] [address]   Urinalysis, Routine w reflex microscopic     Status: Abnormal   Collection Time: 06/25/17  6:12 PM  Result Value Ref Range   Color, Urine YELLOW YELLOW   APPearance CLEAR CLEAR   Specific Gravity, Urine 1.018 1.005 - 1.030   pH 5.0 5.0 - 8.0   Glucose, UA NEGATIVE NEGATIVE mg/dL   Hgb urine dipstick NEGATIVE NEGATIVE   Bilirubin Urine  NEGATIVE NEGATIVE   Ketones, ur 5 (A) NEGATIVE mg/dL   Protein, ur NEGATIVE NEGATIVE mg/dL   Nitrite NEGATIVE NEGATIVE   Leukocytes, UA NEGATIVE NEGATIVE    Comment: Performed at [facility], [address] [place], [place] [address]  Culture, blood (routine x 2)     Status: None (Preliminary result)   Collection Time: 06/26/17  2:40 AM  Result Value Ref Range   Specimen Description RIGHT ANTECUBITAL    Special Requests      BOTTLES DRAWN AEROBIC AND ANAEROBIC Blood Culture adequate volume   Culture      NO GROWTH 1 DAY Performed at [facility], [address], [place], [place] [address]    Report Status PENDING   Lactic acid, plasma     Status: None   Collection Time: 06/26/17  2:40 AM  Result Value Ref Range   Lactic Acid, Venous 1.0 0.5 - 1.9 mmol/L    Comment: Performed at [facility], [address] [place], [place] [address]  Troponin I     Status: Abnormal   Collection Time: 06/26/17  2:41 AM  Result Value Ref Range   Troponin I 0.05 (HH) <0.03 ng/mL    Comment: CRITICAL RESULT CALLED TO, READ BACK BY AND VERIFIED WITH: HARRIS,B @ 0417 ON 06/26/17 BY JUW Performed at [facility], [address], [place], [place] [address]   CBC     Status: Abnormal   Collection Time: 06/26/17  2:41 AM  Result Value Ref Range   WBC 13.2 (H) 4.0 - 10.5 K/uL   RBC 3.87 3.87 - 5.11 MIL/uL   Hemoglobin 10.0 (L) 12.0 - 15.0 g/dL   HCT 34.0 (L) 36.0 - 46.0 %   MCV 87.9 78.0 - 100.0 fL   MCH 25.8 (L) 26.0 - 34.0 pg   MCHC 29.4 (L) 30.0 - 36.0 g/dL   RDW 20.3 (H) 11.5 - 15.5 %   Platelets 424 (H) 150 - 400 K/uL    Comment: Performed at [facility], [address] [place], [place] [address]  Culture, blood (routine x 2)     Status: None (Preliminary result)   Collection Time: 06/26/17  5:22 AM  Result Value Ref Range   Specimen Description RIGHT ANTECUBITAL    Special Requests      BOTTLES DRAWN AEROBIC AND ANAEROBIC Blood Culture adequate  volume   Culture      NO GROWTH 1  DAY Performed at [facility], [address] [place], [place] [address]    Report Status PENDING   Troponin I     Status: Abnormal   Collection Time: 06/26/17  8:24 AM  Result Value Ref Range   Troponin I 0.04 (HH) <0.03 ng/mL    Comment: CRITICAL VALUE NOTED.  VALUE IS CONSISTENT WITH PREVIOUSLY REPORTED AND CALLED VALUE. Performed at [facility], [address] [place], [place] [address]     ABGS No results for input(s): PHART, PO2ART, TCO2, HCO3 in the last 72 hours.  Invalid input(s): PCO2 CULTURES Recent Results (from the past 240 hour(s))  Culture, blood (routine x 2)     Status: None (Preliminary result)   Collection Time: 06/26/17  2:40 AM  Result Value Ref Range Status   Specimen Description RIGHT ANTECUBITAL  Final   Special Requests   Final    BOTTLES DRAWN AEROBIC AND ANAEROBIC Blood Culture adequate volume   Culture   Final    NO GROWTH 1 DAY Performed at [facility], [address], [place], [place] [address]    Report Status PENDING  Incomplete  Culture, blood (routine x 2)     Status: None (Preliminary result)   Collection Time: 06/26/17  5:22 AM  Result Value Ref Range Status   Specimen Description RIGHT ANTECUBITAL  Final   Special Requests   Final    BOTTLES DRAWN AEROBIC AND ANAEROBIC Blood Culture adequate volume   Culture   Final    NO GROWTH 1 DAY Performed at [facility], [address], [place],  [address]    Report Status PENDING  Incomplete   Studies/Results: Dg Chest Portable 1 View  Result Date: 06/25/2017 CLINICAL DATA:  pt being brought in for evaluation related to UTI and congested cough, pt held for 2nd view EXAM: PORTABLE CHEST 1 VIEW COMPARISON:  05/28/2015 FINDINGS: Cardiac silhouette is normal in size. No convincing mediastinal or hilar masses. There is opacity at the right lung base partly silhouetting the lateral right hemidiaphragm. This may be due to atelectasis or pneumonia. Prominent bronchovascular markings are noted  bilaterally with no other areas of consolidation. No convincing pleural effusion.  No pneumothorax. Skeletal structures are demineralized. There partly imaged changes from the reduction of the right humeral fracture, stable. IMPRESSION: 1. Right lung base opacity consistent with pneumonia or atelectasis. No evidence of pulmonary edema. Electronically Signed   By: Lajean Manes M.D.   On: 06/25/2017 17:42    Medications:  Prior to Admission:  Medications Prior to Admission  Medication Sig Dispense Refill Last Dose  . amLODipine (NORVASC) 5 MG tablet Take 5 mg by mouth daily.   06/25/2017 at Unknown time  . furosemide (LASIX) 40 MG tablet Take 40 mg by mouth daily as needed for fluid.    06/25/2017 at Unknown time  . gabapentin (NEURONTIN) 100 MG capsule Take 1 capsule (100 mg total) by mouth 3 (three) times daily. 90 capsule 5 06/25/2017 at Unknown time  . losartan (COZAAR) 100 MG tablet Take 100 mg by mouth daily.   06/25/2017 at Unknown time  . metoprolol tartrate (LOPRESSOR) 25 MG tablet Take 25 mg by mouth 2 (two) times daily.     06/25/2017 at 8am  . moxifloxacin (VIGAMOX) 0.5 % ophthalmic solution Place 1 drop into the left eye 2 (two) times daily.    06/25/2017 at Unknown time  . PROAIR HFA 108 (90  BASE) MCG/ACT inhaler Inhale 2 puffs into the lungs every 6 (six) hours as needed for wheezing or shortness of breath.    06/25/2017 at Unknown time  . sertraline (ZOLOFT) 50 MG tablet Take 50 mg by mouth every morning.   06/25/2017 at Unknown time  . timolol (TIMOPTIC) 0.25 % ophthalmic solution Place 1 drop into the right eye at bedtime.   06/25/2017 at Unknown time  . albuterol (PROVENTIL HFA;VENTOLIN HFA) 108 (90 Base) MCG/ACT inhaler Inhale 1-2 puffs into the lungs every 6 (six) hours as needed for wheezing or shortness of breath.   unknown   Scheduled: . amLODipine  5 mg Oral Daily  . enoxaparin (LOVENOX) injection  40 mg Subcutaneous Q24H  . erythromycin   Right Eye Q8H  . ipratropium-albuterol  3 mL  Nebulization TID  . losartan  100 mg Oral Daily  . mouth rinse  15 mL Mouth Rinse BID  . metoprolol tartrate  25 mg Oral BID  . sertraline  50 mg Oral q morning - 10a  . timolol  1 drop Right Eye QHS   Continuous: . ampicillin-sulbactam (UNASYN) IV Stopped (06/27/17 0050)   HDT:[id number], ondansetron **OR** ondansetron (ZOFRAN) IV, polyethylene glycol  Assesment: She was admitted with pneumonia and systemic inflammatory response syndrome and I think it is likely from aspiration considering the vomiting.  I have dropped Zithromax from her treatment.  She is still having trouble with vomiting and is scheduled for modified barium swallow.  She may need GI consultation depending on the results of that  She has personal history of stroke with severe speech abnormality related to that.  She has hypertension which is well controlled  She has glaucoma unchanged Principal Problem:   CAP (community acquired pneumonia) Active Problems:   H/O: stroke with residual effects   Hypertension    Plan: Continue Unasyn.  Discontinue Zithromax.  For modified barium swallow.    LOS: 2 days   Juris Gosnell L 06/27/2017, 8:12 AM

## 2017-06-27 NOTE — Progress Notes (Signed)
Modified Barium Swallow Progress Note  Patient Details  Name: Carla Bonilla MRN: [id number] Date of Birth: 05-13-1920  Today's Date: 06/27/2017  Modified Barium Swallow completed.  Full report located under Chart Review in the Imaging Section.  Brief recommendations include the following:  Clinical Impression  The MBSS completed this date was limited due to Pt refusal to take much after her first presentation of barium mixture. Pt only observed with tsp thin, straw sips thin, and two bites of puree. During that assessment, Pt exhibited reduced bolus cohesiveness with some labial spillage with tsp presentation of thin, min delay in swallow initiation with swallow trigger after spilling to pyriforms with straw sips thin and after filling valleculae with puree. Pt with trace, transient penetration of thins by teaspoon and no penetration/aspiration observed with limited straw sips thin, no significant pharyngeal residuals noted. SLP provided max total cues for encouragement for additional intake, however she refused (also with caregiver). Given Pt's clinical history, suspect that Pt may have aspirated emesis given recent episodes of vomiting per caregiver. Caregiver also reports that Pt typically has a good appetite at home (eats mashed regular textures) and drinks mostly water. Recommend D3/mech soft with thin liquids with full feeder assist when Pt is alert and upright. OK for po meds whole with liquid or puree. Monitor for emesis post meals and consider esophageal component. SLP will follow during acute stay.    Swallow Evaluation Recommendations   Recommended Consults: Consider esophageal assessment(If emesis persists; Pt likely to refuse barium swallow)   SLP Diet Recommendations: Dysphagia 3 (Mech soft) solids;Thin liquid   Liquid Administration via: Cup;Straw   Medication Administration: Whole meds with liquid   Supervision: Full assist for feeding;Full supervision/cueing for  compensatory strategies   Compensations: Minimize environmental distractions;Slow rate;Small sips/bites;Follow solids with liquid   Postural Changes: Remain semi-upright after after feeds/meals (Comment);Seated upright at 90 degrees   Oral Care Recommendations: Oral care BID;Staff/trained caregiver to provide oral care   Other Recommendations: Clarify dietary restrictions   Thank you,  Carla Bonilla, CCC-SLP [phone] \ Carla Bonilla 06/27/2017,12:05 PM

## 2017-06-28 DIAGNOSIS — G9341 Metabolic encephalopathy: Secondary | ICD-10-CM | POA: Diagnosis present

## 2017-06-28 LAB — GLUCOSE, CAPILLARY: GLUCOSE-CAPILLARY: 73 mg/dL (ref 65–99)

## 2017-06-28 MED ORDER — FLUTICASONE PROPIONATE 50 MCG/ACT NA SUSP
1.0000 | Freq: Every day | NASAL | Status: DC
  Administered 2017-06-29: 1 via NASAL
  Filled 2017-06-28: qty 16

## 2017-06-28 NOTE — NC FL2 (Signed)
[place] MEDICAID FL2 LEVEL OF CARE SCREENING TOOL     IDENTIFICATION  Patient Name: Carla MajorJean M Date Birthdate: 07-16-1920 Sex: female Admission Date (Current Location): 06/25/2017  [facility] [place]   Facility and Address:  [facility],  [address], [place] [id number]      Provider Number: [id number]  Attending Physician Name and Address:  Kari BaarsHawkins, Edward, MD  Relative Name and Phone Number:       Current Level of Care: Hospital Recommended Level of Care: Skilled Nursing Facility Prior Approval Number:    Date Approved/Denied:   PASRR Number: [id number] [id number])  Discharge Plan: SNF    Current Diagnoses: Patient Active Problem List   Diagnosis Date Noted  . Metabolic encephalopathy 06/28/2017  . CAP (community acquired pneumonia) 06/25/2017  . Candida infection of genital region 05/28/2015  . Cellulitis of right lower leg 05/24/2015  . Cellulitis 05/24/2015  . GI bleeding 06/27/2014  . Heme positive stool   . Acute blood loss anemia   . Syncope and collapse 06/23/2014  . Laceration 06/23/2014  . Syncope 10/07/2013  . Spinal stenosis of lumbar region with radiculopathy 01/06/2012  . Sciatica neuralgia 01/06/2012  . Proximal humerus fracture 03/30/2011  . S/P shoulder surgery 12/08/2010  . Fracture, shoulder 12/08/2010  . Fracture of humerus, distal, right, closed 11/24/2010  . H/O: stroke with residual effects 11/24/2010  . Hypertension 11/24/2010  . Glaucoma 11/24/2010    Orientation RESPIRATION BLADDER Height & Weight     Self  O2(see discharge summary. 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 amLODipine (NORVASC) tablet 5 mg  5 mg Oral Daily Emokpae, Ejiroghene E, MD   5 mg at 06/28/17 0955  . Ampicillin-Sulbactam (UNASYN) 3 g in sodium chloride 0.9 % 100 mL IVPB  3 g Intravenous Q12H Kari BaarsHawkins, Edward, MD   Stopped at 06/28/17 0050  . enoxaparin (LOVENOX) injection 30 mg  30 mg Subcutaneous Q24H Kari BaarsHawkins, Edward, MD   30 mg at 06/27/17 2216  . erythromycin ophthalmic ointment   Right Eye Q8H Emokpae, Ejiroghene E, MD      . ipratropium-albuterol (DUONEB) 0.5-2.5 (3) MG/3ML nebulizer solution 3 mL  3 mL Nebulization TID Kari BaarsHawkins, Edward, MD   3 mL at 06/28/17 0832  . labetalol (NORMODYNE,TRANDATE) injection 5 mg  5 mg  Intravenous Q4H PRN Emokpae, Ejiroghene E, MD      . losartan (COZAAR) tablet 100 mg  100 mg Oral Daily Emokpae, Ejiroghene E, MD   100 mg at 06/28/17 0956  . MEDLINE mouth rinse  15 mL Mouth Rinse BID Emokpae, Ejiroghene E, MD   15 mL at 06/28/17 0956  . metoprolol tartrate (LOPRESSOR) tablet 25 mg  25 mg Oral BID Emokpae, Ejiroghene E, MD   25 mg at 06/28/17 0955  . ondansetron (ZOFRAN) tablet 4 mg  4 mg Oral Q6H PRN Emokpae, Ejiroghene E, MD       Or  . ondansetron (ZOFRAN) injection 4  mg  4 mg Intravenous Q6H PRN Emokpae, Ejiroghene E, MD   4 mg at 06/26/17 1820  . polyethylene glycol (MIRALAX / GLYCOLAX) packet 17 g  17 g Oral Daily PRN Emokpae, Ejiroghene E, MD      . sertraline (ZOLOFT) tablet 50 mg  50 mg Oral q morning - 10a Emokpae, Ejiroghene E, MD   50 mg at 06/28/17 0956  . timolol (TIMOPTIC) 0.25 % ophthalmic solution 1 drop  1 drop Right Eye QHS Emokpae, Ejiroghene E, MD   1 drop at 06/27/17 2217     Discharge Medications: Please see discharge summary for a list of discharge medications.  Relevant Imaging Results:  Relevant Lab Results:   Additional Information SSN [id number] Juleen China, LCSW

## 2017-06-28 NOTE — Clinical Social Work Note (Signed)
Caregiver indicated that now wants patient to go to SNF placement for rehab due to being concern that she would not be able to manage patient at home.   Nelva Hauk, Juleen ChinaHeather D, LCSW

## 2017-06-28 NOTE — Care Management (Signed)
CM spoke with Tammy, Caregiver, over the phone about DC planning. Tammy said she did not say she was able to care for pt at home, just explained the level of care patient required pta. CM explained DC is planned for tomorrow. Tammy unable to care for pt in her current state and would like to pursue STR. CSW aware. Tammy will be at hospital to discuss SNF options between 9:30 and 1:30pm today.

## 2017-06-28 NOTE — Progress Notes (Signed)
Subjective: She seems to be doing a little bit better.  She has no new complaints.  Her speech is difficult to understand.  She did fairly well with her speech evaluation but is at risk of aspiration  Objective: Vital signs in last 24 hours: Temp:  [98.2 F (36.8 C)-98.7 F (37.1 C)] 98.2 F (36.8 C) (04/09 0603) Pulse Rate:  [76-99] 76 (04/09 0603) Resp:  [16-18] 18 (04/09 0603) BP: (114-172)/(59-86) 151/66 (04/09 0603) SpO2:  [91 %-95 %] 92 % (04/09 0603) Weight change:  Last BM Date: 06/25/17  Intake/Output from previous day: 04/08 0701 - 04/09 0700 In: 540 [P.O.:340; IV Piggyback:200] Out: -   PHYSICAL EXAM General appearance: alert, cooperative and Speech very difficult to understand Resp: diminished breath sounds bilaterally Cardio: regular rate and rhythm, S1, S2 normal, no murmur, click, rub or gallop GI: soft, non-tender; bowel sounds normal; no masses,  no organomegaly Extremities: extremities normal, atraumatic, no cyanosis or edema  Lab Results:  Results for orders placed or performed during the hospital encounter of 06/25/17 (from the past 48 hour(s))  Glucose, capillary     Status: None   Collection Time: 06/28/17  8:02 AM  Result Value Ref Range   Glucose-Capillary 73 65 - 99 mg/dL   Comment 1 Document in Chart     ABGS No results for input(s): PHART, PO2ART, TCO2, HCO3 in the last 72 hours.  Invalid input(s): PCO2 CULTURES Recent Results (from the past 240 hour(s))  Culture, blood (routine x 2)     Status: None (Preliminary result)   Collection Time: 06/26/17  2:40 AM  Result Value Ref Range Status   Specimen Description RIGHT ANTECUBITAL  Final   Special Requests   Final    BOTTLES DRAWN AEROBIC AND ANAEROBIC Blood Culture adequate volume   Culture   Final    NO GROWTH 2 DAYS Performed at [facility], [address] [place], [place] [address]    Report Status PENDING  Incomplete  Culture, blood (routine x 2)     Status: None (Preliminary  result)   Collection Time: 06/26/17  5:22 AM  Result Value Ref Range Status   Specimen Description RIGHT ANTECUBITAL  Final   Special Requests   Final    BOTTLES DRAWN AEROBIC AND ANAEROBIC Blood Culture adequate volume   Culture   Final    NO GROWTH 2 DAYS Performed at [facility], [address], [place], [place] [address]    Report Status PENDING  Incomplete   Studies/Results: Dg Swallowing Func-speech Pathology  Result Date: 06/27/2017 Objective Swallowing Evaluation: Type of Study: MBS-Modified Barium Swallow Study  Patient Details Name: Carla Bonilla MRN: [id number] Date of Birth: 1921/03/12 Today's Date: 06/27/2017 Time: SLP Start Time (ACUTE ONLY): 1105 -SLP Stop Time (ACUTE ONLY): 1135 SLP Time Calculation (min) (ACUTE ONLY): 30 min Past Medical History: Past Medical History: Diagnosis Date . Blind left eye  . CVA (cerebral vascular accident) (HCC)   speech problem . Glaucoma  . Hearing loss  . Hyperlipidemia  . Hypertension  . Neuropathy  . Stroke [facility])  Past Surgical History: Past Surgical History: Procedure Laterality Date . EYE SURGERY    left . ORIF HUMERUS FRACTURE  11/25/2010  Procedure: OPEN REDUCTION INTERNAL FIXATION (ORIF) PROXIMAL HUMERUS FRACTURE;  Surgeon: Fuller Canada, MD;  Location: [facility];  Service: Orthopedics;  Laterality: Right; . SHOULDER SURGERY    RIGHT shoulder open treatment internal fixation with locking plate. Date of surgery November 25, 2010 HPI: Carla Bonilla  a 84 y.o.femalewith medical history significantfor CVA- with left sided deficits, HTN, spinal stenosis.History was obtained mostlyfrom chart review,as patient's is confused as at the time of my evaluation. Attempted contacting Care giver- got voice mail.Patient was brought to the ED today by family with complaints of confusion,and congestedcough.Also noted nausea vomiting and foul-smelling urine.Family notes patient coughing after vomiting. Right lung base opacity consistent with  pneumonia or atelectasis.No evidence of pulmonary edema.  Subjective: 'What are you doing, trying to kill me?" Assessment / Plan / Recommendation CHL IP CLINICAL IMPRESSIONS 06/27/2017 Clinical Impression The MBSS completed this date was limited due to Pt refusal to take much after her first presentation of barium mixture. Pt only observed with tsp thin, straw sips thin, and two bites of puree. During that assessment, Pt exhibited reduced bolus cohesiveness with some labial spillage with tsp presentation of thin, min delay in swallow initiation with swallow trigger after spilling to pyriforms with straw sips thin and after filling valleculae with puree. Pt with trace, transient penetration of thins by teaspoon and no penetration/aspiration observed with limited straw sips thin, no significant pharyngeal residuals noted. SLP provided max total cues for encouragement for additional intake, however she refused (also with caregiver). Given Pt's clinical history, suspect that Pt may have aspirated emesis given recent episodes of vomiting per caregiver. Caregiver also reports that Pt typically has a good appetite at home (eats mashed regular textures) and drinks mostly water. Recommend D3/mech soft with thin liquids with full feeder assist when Pt is alert and upright. OK for po meds whole with liquid or puree. Monitor for emesis post meals and consider esophageal component. SLP will follow during acute stay.  SLP Visit Diagnosis Dysphagia, oropharyngeal phase (R13.12) Attention and concentration deficit following -- Frontal lobe and executive function deficit following -- Impact on safety and function Mild aspiration risk;Risk for inadequate nutrition/hydration   CHL IP TREATMENT RECOMMENDATION 06/27/2017 Treatment Recommendations Therapy as outlined in treatment plan below   Prognosis 06/27/2017 Prognosis for Safe Diet Advancement Fair Barriers to Reach Goals Cognitive deficits Barriers/Prognosis Comment -- CHL IP DIET  RECOMMENDATION 06/27/2017 SLP Diet Recommendations Dysphagia 3 (Mech soft) solids;Thin liquid Liquid Administration via Cup;Straw Medication Administration Whole meds with liquid Compensations Minimize environmental distractions;Slow rate;Small sips/bites;Follow solids with liquid Postural Changes Remain semi-upright after after feeds/meals (Comment);Seated upright at 90 degrees   CHL IP OTHER RECOMMENDATIONS 06/27/2017 Recommended Consults Consider esophageal assessment Oral Care Recommendations Oral care BID;Staff/trained caregiver to provide oral care Other Recommendations Clarify dietary restrictions   CHL IP FOLLOW UP RECOMMENDATIONS 06/27/2017 Follow up Recommendations 24 hour supervision/assistance   CHL IP FREQUENCY AND DURATION 06/27/2017 Speech Therapy Frequency (ACUTE ONLY) min 2x/week Treatment Duration 1 week      CHL IP ORAL PHASE 06/27/2017 Oral Phase Impaired Oral - Pudding Teaspoon -- Oral - Pudding Cup -- Oral - Honey Teaspoon -- Oral - Honey Cup -- Oral - Nectar Teaspoon -- Oral - Nectar Cup -- Oral - Nectar Straw -- Oral - Thin Teaspoon Right anterior bolus loss;Decreased bolus cohesion Oral - Thin Cup -- Oral - Thin Straw -- Oral - Puree -- Oral - Mech Soft -- Oral - Regular -- Oral - Multi-Consistency -- Oral - Pill -- Oral Phase - Comment --  CHL IP PHARYNGEAL PHASE 06/27/2017 Pharyngeal Phase Impaired Pharyngeal- Pudding Teaspoon -- Pharyngeal -- Pharyngeal- Pudding Cup -- Pharyngeal -- Pharyngeal- Honey Teaspoon -- Pharyngeal -- Pharyngeal- Honey Cup -- Pharyngeal -- Pharyngeal- Nectar Teaspoon -- Pharyngeal -- Pharyngeal- Nectar Cup -- Pharyngeal --  Pharyngeal- Nectar Straw -- Pharyngeal -- Pharyngeal- Thin Teaspoon Delayed swallow initiation-vallecula;Penetration/Aspiration during swallow Pharyngeal Material enters airway, remains ABOVE vocal cords then ejected out Pharyngeal- Thin Cup -- Pharyngeal -- Pharyngeal- Thin Straw Delayed swallow initiation-vallecula;Delayed swallow initiation-pyriform  sinuses Pharyngeal -- Pharyngeal- Puree Delayed swallow initiation-vallecula;WFL Pharyngeal -- Pharyngeal- Mechanical Soft NT Pharyngeal -- Pharyngeal- Regular -- Pharyngeal -- Pharyngeal- Multi-consistency -- Pharyngeal -- Pharyngeal- Pill (No Data) Pharyngeal -- Pharyngeal Comment --  CHL IP CERVICAL ESOPHAGEAL PHASE 06/27/2017 Cervical Esophageal Phase WFL Pudding Teaspoon -- Pudding Cup -- Honey Teaspoon -- Honey Cup -- Nectar Teaspoon -- Nectar Cup -- Nectar Straw -- Thin Teaspoon -- Thin Cup -- Thin Straw -- Puree -- Mechanical Soft -- Regular -- Multi-consistency -- Pill -- Cervical Esophageal Comment -- No flowsheet data found. Thank you, Havery Moros, CCC-SLP [phone] PORTER,DABNEY 06/27/2017, 12:06 PM               Medications:  Prior to Admission:  Medications Prior to Admission  Medication Sig Dispense Refill Last Dose  . amLODipine (NORVASC) 5 MG tablet Take 5 mg by mouth daily.   06/25/2017 at Unknown time  . furosemide (LASIX) 40 MG tablet Take 40 mg by mouth daily as needed for fluid.    06/25/2017 at Unknown time  . gabapentin (NEURONTIN) 100 MG capsule Take 1 capsule (100 mg total) by mouth 3 (three) times daily. 90 capsule 5 06/25/2017 at Unknown time  . losartan (COZAAR) 100 MG tablet Take 100 mg by mouth daily.   06/25/2017 at Unknown time  . metoprolol tartrate (LOPRESSOR) 25 MG tablet Take 25 mg by mouth 2 (two) times daily.     06/25/2017 at 8am  . moxifloxacin (VIGAMOX) 0.5 % ophthalmic solution Place 1 drop into the left eye 2 (two) times daily.    06/25/2017 at Unknown time  . PROAIR HFA 108 (90 BASE) MCG/ACT inhaler Inhale 2 puffs into the lungs every 6 (six) hours as needed for wheezing or shortness of breath.    06/25/2017 at Unknown time  . sertraline (ZOLOFT) 50 MG tablet Take 50 mg by mouth every morning.   06/25/2017 at Unknown time  . timolol (TIMOPTIC) 0.25 % ophthalmic solution Place 1 drop into the right eye at bedtime.   06/25/2017 at Unknown time  . albuterol (PROVENTIL  HFA;VENTOLIN HFA) 108 (90 Base) MCG/ACT inhaler Inhale 1-2 puffs into the lungs every 6 (six) hours as needed for wheezing or shortness of breath.   unknown   Scheduled: . amLODipine  5 mg Oral Daily  . enoxaparin (LOVENOX) injection  30 mg Subcutaneous Q24H  . erythromycin   Right Eye Q8H  . ipratropium-albuterol  3 mL Nebulization TID  . losartan  100 mg Oral Daily  . mouth rinse  15 mL Mouth Rinse BID  . metoprolol tartrate  25 mg Oral BID  . sertraline  50 mg Oral q morning - 10a  . timolol  1 drop Right Eye QHS   Continuous: . ampicillin-sulbactam (UNASYN) IV Stopped (06/28/17 0050)   BJY:NWGNFAOZH, ondansetron **OR** ondansetron (ZOFRAN) IV, polyethylene glycol  Assesment: She was admitted with pneumonia likely from aspiration.  She is on unasyn.  She is improving.  She had altered mental status which is better.  She has personal history of stroke with severe speech impediment from that. Principal Problem:   CAP (community acquired pneumonia) Active Problems:   H/O: stroke with residual effects   Hypertension    Plan: Continue Unasyn today probable switch to Augmentin tomorrow  and potential discharge    LOS: 3 days   Illias Pantano L 06/28/2017, 8:30 AM

## 2017-06-28 NOTE — Progress Notes (Signed)
Pharmacy Antibiotic Note  Carla Bonilla is a 84 y.o. female admitted on 06/25/2017 with pneumonia.  Pharmacy has been consulted for unasyn dosing.  Plan: Unasyn 3gm IV q12h F/U cxs and clinical progress Monitor V/S, labs  Height: 5\' 6"  (167.6 cm) Weight: 116 lb 10 oz (52.9 kg) IBW/kg (Calculated) : 59.3  Temp (24hrs), Avg:98.5 F (36.9 C), Min:98.2 F (36.8 C), Max:98.7 F (37.1 C)  Recent Labs  Lab 06/25/17 1725 06/26/17 0240 06/26/17 0241  WBC 14.7*  --  13.2*  CREATININE 1.04*  --   --   LATICACIDVEN  --  1.0  --     Estimated Creatinine Clearance: 27.6 mL/min (A) (by C-G formula based on SCr of 1.04 mg/dL (H)).    No Known Allergies  Antimicrobials this admission: Unasyn 4/9>>   Dose adjustments this admission: N/A  Microbiology results: 4/7 BCx: pending  Thank you for allowing pharmacy to be a part of this patient's care.  Valrie HartScott Chayton Murata, PharmD Clinical Pharmacist Pager:  [phone] 06/28/2017   06/28/2017 10:27 AM

## 2017-06-28 NOTE — Progress Notes (Signed)
  Speech Language Pathology Treatment: Dysphagia  Patient Details Name: Carla [facility]illa MRN: [id number] DOB: 1921-03-20 Today's Date: 06/28/2017 Time: [id number] SLP Time Calculation (min) (ACUTE ONLY): 20 min  Assessment / Plan / Recommendation Clinical Impression  Pt seen for ongoing dysphagia intervention following MBSS completed yesterday. MBSS assessment was somewhat limited yesterday due to Pt unwilling to take more than a few sips/bites and also obscured view due to shoulder positioning. Pt was placed on a D3/mech soft diet with thin liquids and lunch tray observed in room with minimal intake. 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 Pt agreeable to a few bites of applesauce and straw sips of liquid. Pt with wet/congested cough prior to po and some expiratory wheeze (this was also present when Pt seen in 2017). Nursing reports that Pt consumed a good breakfast and only a small amount of lunch today.  Continue diet as ordered and recommend f/u at SNF with SLP for ongoing diet tolerance (may need to downgrade textures for meats) and pt/caregiver education.    HPI HPI: Carla GripJean M Easterwoodis a 84 y.o.femalewith medical history significantfor CVA- with left sided deficits, HTN, spinal stenosis.History was obtained mostlyfrom chart review,as patient's is confused as at the time of my evaluation. Attempted contacting Care giver- got voice mail.Patient was brought to the ED today by family with complaints of confusion,and congestedcough.Also noted nausea vomiting and foul-smelling urine.Family notes patient coughing after vomiting. Right lung base opacity consistent with pneumonia or atelectasis.No evidence of pulmonary edema.      SLP Plan  Continue with current plan of care       Recommendations  Diet recommendations: Dysphagia 3 (mechanical soft);Thin liquid Liquids provided via: Cup;Straw Medication  Administration: Whole meds with liquid Supervision: Staff to assist with self feeding;Intermittent supervision to cue for compensatory strategies Compensations: Minimize environmental distractions;Slow rate;Small sips/bites Postural Changes and/or Swallow Maneuvers: Seated upright 90 degrees;Upright 30-60 min after meal                Oral Care Recommendations: Oral care BID Follow up Recommendations: Skilled Nursing facility;24 hour supervision/assistance SLP Visit Diagnosis: Dysphagia, oropharyngeal phase (R13.12) Plan: Continue with current plan of care       Thank you,  Havery MorosDabney Shella Lahman, CCC-SLP [phone]                 Cameron Katayama 06/28/2017, 3:01 PM

## 2017-06-29 MED ORDER — IPRATROPIUM-ALBUTEROL 0.5-2.5 (3) MG/3ML IN SOLN: 3.0000 mL | Freq: Two times a day (BID) | RESPIRATORY_TRACT | Status: DC

## 2017-06-29 MED ORDER — POLYETHYLENE GLYCOL 3350 17 G PO PACK: 17.0000 g | PACK | Freq: Every day | ORAL | 0 refills | Status: DC | PRN

## 2017-06-29 MED ORDER — ONDANSETRON HCL 4 MG PO TABS: 4.0000 mg | ORAL_TABLET | Freq: Four times a day (QID) | ORAL | 0 refills | Status: DC | PRN

## 2017-06-29 MED ORDER — AMOXICILLIN-POT CLAVULANATE 875-125 MG PO TABS: 1.0000 | ORAL_TABLET | Freq: Two times a day (BID) | ORAL | 0 refills | Status: AC

## 2017-06-29 MED ORDER — FLUTICASONE PROPIONATE 50 MCG/ACT NA SUSP: 1.0000 | Freq: Every day | NASAL | 2 refills | Status: DC

## 2017-06-29 NOTE — Progress Notes (Signed)
Subjective: She is overall doing pretty well.  Her breathing is doing much better.  She is at risk for aspiration.  I think her caregivers have chosen for her to go to skilled care facility for rehab before she comes home.  Objective: Vital signs in last 24 hours: Temp:  [98.4 F (36.9 C)-99.2 F (37.3 C)] 98.7 F (37.1 C) (04/10 0433) Pulse Rate:  [68-84] 68 (04/10 0433) Resp:  [14-17] 17 (04/10 0433) BP: (138-162)/(53-70) 162/53 (04/10 0433) SpO2:  [93 %-100 %] 95 % (04/10 0804) Weight change:  Last BM Date: 06/25/17  Intake/Output from previous day: 04/09 0701 - 04/10 0700 In: 340 [P.O.:240; IV Piggyback:100] Out: 1275 [Urine:1275]  PHYSICAL EXAM General appearance: alert, cooperative and Dysarthric Resp: clear to auscultation bilaterally Cardio: regular rate and rhythm, S1, S2 normal, no murmur, click, rub or gallop GI: soft, non-tender; bowel sounds normal; no masses,  no organomegaly Extremities: extremities normal, atraumatic, no cyanosis or edema  Lab Results:  Results for orders placed or performed during the hospital encounter of 06/25/17 (from the past 48 hour(s))  Glucose, capillary     Status: None   Collection Time: 06/28/17  8:02 AM  Result Value Ref Range   Glucose-Capillary 73 65 - 99 mg/dL   Comment 1 Document in Chart     ABGS No results for input(s): PHART, PO2ART, TCO2, HCO3 in the last 72 hours.  Invalid input(s): PCO2 CULTURES Recent Results (from the past 240 hour(s))  Culture, blood (routine x 2)     Status: None (Preliminary result)   Collection Time: 06/26/17  2:40 AM  Result Value Ref Range Status   Specimen Description RIGHT ANTECUBITAL  Final   Special Requests   Final    BOTTLES DRAWN AEROBIC AND ANAEROBIC Blood Culture adequate volume   Culture   Final    NO GROWTH 3 DAYS Performed at [facility], [address], [place], [place] [address]    Report Status PENDING  Incomplete  Culture, blood (routine x 2)     Status: None  (Preliminary result)   Collection Time: 06/26/17  5:22 AM  Result Value Ref Range Status   Specimen Description RIGHT ANTECUBITAL  Final   Special Requests   Final    BOTTLES DRAWN AEROBIC AND ANAEROBIC Blood Culture adequate volume   Culture   Final    NO GROWTH 3 DAYS Performed at [facility], [address] [place], [place] [address]    Report Status PENDING  Incomplete   Studies/Results: Dg Swallowing Func-speech Pathology  Result Date: 06/27/2017 Objective Swallowing Evaluation: Type of Study: MBS-Modified Barium Swallow Study  Patient Details Name: Carla Bonilla MRN: [id number] Date of Birth: 1920-12-24 Today's Date: 06/27/2017 Time: SLP Start Time (ACUTE ONLY): 1105 -SLP Stop Time (ACUTE ONLY): 1135 SLP Time Calculation (min) (ACUTE ONLY): 30 min Past Medical History: Past Medical History: Diagnosis Date . Blind left eye  . CVA (cerebral vascular accident) (HCC)   speech problem . Glaucoma  . Hearing loss  . Hyperlipidemia  . Hypertension  . Neuropathy  . Stroke [facility])  Past Surgical History: Past Surgical History: Procedure Laterality Date . EYE SURGERY    left . ORIF HUMERUS FRACTURE  11/25/2010  Procedure: OPEN REDUCTION INTERNAL FIXATION (ORIF) PROXIMAL HUMERUS FRACTURE;  Surgeon: Fuller Canada, MD;  Location: [facility];  Service: Orthopedics;  Laterality: Right; . SHOULDER SURGERY    RIGHT shoulder open treatment internal fixation with locking plate. Date of surgery November 25, 2010 HPI: Angelita Harnack [facility]  a 84 y.o.femalewith medical history significantfor CVA- with left sided deficits, HTN, spinal stenosis.History was obtained mostlyfrom chart review,as patient's is confused as at the time of my evaluation. Attempted contacting Care giver- got voice mail.Patient was brought to the ED today by family with complaints of confusion,and congestedcough.Also noted nausea vomiting and foul-smelling urine.Family notes patient coughing after vomiting. Right lung base opacity  consistent with pneumonia or atelectasis.No evidence of pulmonary edema.  Subjective: 'What are you doing, trying to kill me?" Assessment / Plan / Recommendation CHL IP CLINICAL IMPRESSIONS 06/27/2017 Clinical Impression The MBSS completed this date was limited due to Pt refusal to take much after her first presentation of barium mixture. Pt only observed with tsp thin, straw sips thin, and two bites of puree. During that assessment, Pt exhibited reduced bolus cohesiveness with some labial spillage with tsp presentation of thin, min delay in swallow initiation with swallow trigger after spilling to pyriforms with straw sips thin and after filling valleculae with puree. Pt with trace, transient penetration of thins by teaspoon and no penetration/aspiration observed with limited straw sips thin, no significant pharyngeal residuals noted. SLP provided max total cues for encouragement for additional intake, however she refused (also with caregiver). Given Pt's clinical history, suspect that Pt may have aspirated emesis given recent episodes of vomiting per caregiver. Caregiver also reports that Pt typically has a good appetite at home (eats mashed regular textures) and drinks mostly water. Recommend D3/mech soft with thin liquids with full feeder assist when Pt is alert and upright. OK for po meds whole with liquid or puree. Monitor for emesis post meals and consider esophageal component. SLP will follow during acute stay.  SLP Visit Diagnosis Dysphagia, oropharyngeal phase (R13.12) Attention and concentration deficit following -- Frontal lobe and executive function deficit following -- Impact on safety and function Mild aspiration risk;Risk for inadequate nutrition/hydration   CHL IP TREATMENT RECOMMENDATION 06/27/2017 Treatment Recommendations Therapy as outlined in treatment plan below   Prognosis 06/27/2017 Prognosis for Safe Diet Advancement Fair Barriers to Reach Goals Cognitive deficits Barriers/Prognosis Comment --  CHL IP DIET RECOMMENDATION 06/27/2017 SLP Diet Recommendations Dysphagia 3 (Mech soft) solids;Thin liquid Liquid Administration via Cup;Straw Medication Administration Whole meds with liquid Compensations Minimize environmental distractions;Slow rate;Small sips/bites;Follow solids with liquid Postural Changes Remain semi-upright after after feeds/meals (Comment);Seated upright at 90 degrees   CHL IP OTHER RECOMMENDATIONS 06/27/2017 Recommended Consults Consider esophageal assessment Oral Care Recommendations Oral care BID;Staff/trained caregiver to provide oral care Other Recommendations Clarify dietary restrictions   CHL IP FOLLOW UP RECOMMENDATIONS 06/27/2017 Follow up Recommendations 24 hour supervision/assistance   CHL IP FREQUENCY AND DURATION 06/27/2017 Speech Therapy Frequency (ACUTE ONLY) min 2x/week Treatment Duration 1 week      CHL IP ORAL PHASE 06/27/2017 Oral Phase Impaired Oral - Pudding Teaspoon -- Oral - Pudding Cup -- Oral - Honey Teaspoon -- Oral - Honey Cup -- Oral - Nectar Teaspoon -- Oral - Nectar Cup -- Oral - Nectar Straw -- Oral - Thin Teaspoon Right anterior bolus loss;Decreased bolus cohesion Oral - Thin Cup -- Oral - Thin Straw -- Oral - Puree -- Oral - Mech Soft -- Oral - Regular -- Oral - Multi-Consistency -- Oral - Pill -- Oral Phase - Comment --  CHL IP PHARYNGEAL PHASE 06/27/2017 Pharyngeal Phase Impaired Pharyngeal- Pudding Teaspoon -- Pharyngeal -- Pharyngeal- Pudding Cup -- Pharyngeal -- Pharyngeal- Honey Teaspoon -- Pharyngeal -- Pharyngeal- Honey Cup -- Pharyngeal -- Pharyngeal- Nectar Teaspoon -- Pharyngeal -- Pharyngeal- Nectar Cup -- Pharyngeal --  Pharyngeal- Nectar Straw -- Pharyngeal -- Pharyngeal- Thin Teaspoon Delayed swallow initiation-vallecula;Penetration/Aspiration during swallow Pharyngeal Material enters airway, remains ABOVE vocal cords then ejected out Pharyngeal- Thin Cup -- Pharyngeal -- Pharyngeal- Thin Straw Delayed swallow initiation-vallecula;Delayed swallow  initiation-pyriform sinuses Pharyngeal -- Pharyngeal- Puree Delayed swallow initiation-vallecula;WFL Pharyngeal -- Pharyngeal- Mechanical Soft NT Pharyngeal -- Pharyngeal- Regular -- Pharyngeal -- Pharyngeal- Multi-consistency -- Pharyngeal -- Pharyngeal- Pill (No Data) Pharyngeal -- Pharyngeal Comment --  CHL IP CERVICAL ESOPHAGEAL PHASE 06/27/2017 Cervical Esophageal Phase WFL Pudding Teaspoon -- Pudding Cup -- Honey Teaspoon -- Honey Cup -- Nectar Teaspoon -- Nectar Cup -- Nectar Straw -- Thin Teaspoon -- Thin Cup -- Thin Straw -- Puree -- Mechanical Soft -- Regular -- Multi-consistency -- Pill -- Cervical Esophageal Comment -- No flowsheet data found. Thank you, Havery Moros, CCC-SLP [phone] PORTER,DABNEY 06/27/2017, 12:06 PM               Medications:  Prior to Admission:  Medications Prior to Admission  Medication Sig Dispense Refill Last Dose  . amLODipine (NORVASC) 5 MG tablet Take 5 mg by mouth daily.   06/25/2017 at Unknown time  . furosemide (LASIX) 40 MG tablet Take 40 mg by mouth daily as needed for fluid.    06/25/2017 at Unknown time  . gabapentin (NEURONTIN) 100 MG capsule Take 1 capsule (100 mg total) by mouth 3 (three) times daily. 90 capsule 5 06/25/2017 at Unknown time  . losartan (COZAAR) 100 MG tablet Take 100 mg by mouth daily.   06/25/2017 at Unknown time  . metoprolol tartrate (LOPRESSOR) 25 MG tablet Take 25 mg by mouth 2 (two) times daily.     06/25/2017 at 8am  . moxifloxacin (VIGAMOX) 0.5 % ophthalmic solution Place 1 drop into the left eye 2 (two) times daily.    06/25/2017 at Unknown time  . PROAIR HFA 108 (90 BASE) MCG/ACT inhaler Inhale 2 puffs into the lungs every 6 (six) hours as needed for wheezing or shortness of breath.    06/25/2017 at Unknown time  . sertraline (ZOLOFT) 50 MG tablet Take 50 mg by mouth every morning.   06/25/2017 at Unknown time  . timolol (TIMOPTIC) 0.25 % ophthalmic solution Place 1 drop into the right eye at bedtime.   06/25/2017 at Unknown time  .  albuterol (PROVENTIL HFA;VENTOLIN HFA) 108 (90 Base) MCG/ACT inhaler Inhale 1-2 puffs into the lungs every 6 (six) hours as needed for wheezing or shortness of breath.   unknown   Scheduled: . amLODipine  5 mg Oral Daily  . enoxaparin (LOVENOX) injection  30 mg Subcutaneous Q24H  . erythromycin   Right Eye Q8H  . fluticasone  1 spray Each Nare Daily  . ipratropium-albuterol  3 mL Nebulization BID  . losartan  100 mg Oral Daily  . mouth rinse  15 mL Mouth Rinse BID  . metoprolol tartrate  25 mg Oral BID  . sertraline  50 mg Oral q morning - 10a  . timolol  1 drop Right Eye QHS   Continuous: . ampicillin-sulbactam (UNASYN) IV Stopped (06/29/17 0029)   UJW:[id number], ondansetron **OR** ondansetron (ZOFRAN) IV, polyethylene glycol  Assesment: She was admitted with community-acquired pneumonia likely from aspiration.  She does have risk for aspiration based on her speech evaluation.  She had been having nausea and vomiting prior to admission.  She has personal history of stroke with severe dysarthria.  She had metabolic encephalopathy but she is back to her baseline mental status  She has hypertension which is  well controlled  She has glaucoma at baseline and her vision is very poor   Principal Problem:   CAP (community acquired pneumonia) Active Problems:   H/O: stroke with residual effects   Hypertension   Metabolic encephalopathy    Plan: Discharge to skilled care facility    LOS: 4 days   Rollo Farquhar L 06/29/2017, 8:07 AM

## 2017-06-29 NOTE — Progress Notes (Signed)
[facility] Juanetta GoslingHawkins. IV site D/C'd and WDL. Pt's VSS. Report called to Kennyth LoseLoretha[facility]. Verbalized understanding. Pt left floor via stretcher accompanied by EMS in stable condition.

## 2017-06-29 NOTE — Discharge Summary (Signed)
Physician Discharge Summary  Patient ID: Carla Bonilla MRN: [id number] DOB/AGE: [birth year] 84 y.o. Primary Care Physician:Carla Bonilla, Carla Dredge, MD Admit date: 06/25/2017 Discharge date: 06/29/2017    Discharge Diagnoses:   Principal Problem:   CAP (community acquired pneumonia) Active Problems:   H/O: stroke with residual effects   Hypertension   Glaucoma   Metabolic encephalopathy   Allergies as of 06/29/2017   No Known Allergies     Medication List    TAKE these medications   albuterol 108 (90 Base) MCG/ACT inhaler Commonly known as:  PROVENTIL HFA;VENTOLIN HFA Inhale 1-2 puffs into the lungs every 6 (six) hours as needed for wheezing or shortness of breath. What changed:  Another medication with the same name was removed. Continue taking this medication, and follow the directions you see here.   amLODipine 5 MG tablet Commonly known as:  NORVASC Take 5 mg by mouth daily.   amoxicillin-clavulanate 875-125 MG tablet Commonly known as:  AUGMENTIN Take 1 tablet by mouth 2 (two) times daily for 7 days.   fluticasone 50 MCG/ACT nasal spray Commonly known as:  FLONASE Place 1 spray into both nostrils daily.   furosemide 40 MG tablet Commonly known as:  LASIX Take 40 mg by mouth daily as needed for fluid.   gabapentin 100 MG capsule Commonly known as:  NEURONTIN Take 1 capsule (100 mg total) by mouth 3 (three) times daily.   ipratropium-albuterol 0.5-2.5 (3) MG/3ML Soln Commonly known as:  DUONEB Take 3 mLs by nebulization 2 (two) times daily.   losartan 100 MG tablet Commonly known as:  COZAAR Take 100 mg by mouth daily.   metoprolol tartrate 25 MG tablet Commonly known as:  LOPRESSOR Take 25 mg by mouth 2 (two) times daily.   moxifloxacin 0.5 % ophthalmic solution Commonly known as:  VIGAMOX Place 1 drop into the left eye 2 (two) times daily.   ondansetron 4 MG tablet Commonly known as:  ZOFRAN Take 1 tablet (4 mg total) by mouth every 6 (six) hours as  needed for nausea.   polyethylene glycol packet Commonly known as:  MIRALAX / GLYCOLAX Take 17 g by mouth daily as needed for mild constipation.   sertraline 50 MG tablet Commonly known as:  ZOLOFT Take 50 mg by mouth every morning.   timolol 0.25 % ophthalmic solution Commonly known as:  TIMOPTIC Place 1 drop into the right eye at bedtime.       Discharged Condition: Improved    Consults: None  Significant Diagnostic Studies: Dg Chest Portable 1 View  Result Date: 06/25/2017 CLINICAL DATA:  pt being brought in for evaluation related to UTI and congested cough, pt held for 2nd view EXAM: PORTABLE CHEST 1 VIEW COMPARISON:  05/28/2015 FINDINGS: Cardiac silhouette is normal in size. No convincing mediastinal or hilar masses. There is opacity at the right lung base partly silhouetting the lateral right hemidiaphragm. This may be due to atelectasis or pneumonia. Prominent bronchovascular markings are noted bilaterally with no other areas of consolidation. No convincing pleural effusion.  No pneumothorax. Skeletal structures are demineralized. There partly imaged changes from the reduction of the right humeral fracture, stable. IMPRESSION: 1. Right lung base opacity consistent with pneumonia or atelectasis. No evidence of pulmonary edema. Electronically Signed   By: Carla Bonilla M.D.   On: 06/25/2017 17:42   Dg Swallowing Func-speech Pathology  Result Date: 06/27/2017 Objective Swallowing Evaluation: Type of Study: MBS-Modified Barium Swallow Study  Patient Details Name: Carla Bonilla MRN: [id number] Date of  Birth: Feb 02, 1921 Today's Date: 06/27/2017 Time: SLP Start Time (ACUTE ONLY): 1105 -SLP Stop Time (ACUTE ONLY): 1135 SLP Time Calculation (min) (ACUTE ONLY): 30 min Past Medical History: Past Medical History: Diagnosis Date . Blind left eye  . CVA (cerebral vascular accident) (HCC)   speech problem . Glaucoma  . Hearing loss  . Hyperlipidemia  . Hypertension  . Neuropathy  . Stroke [facility](HCC)   Past Surgical History: Past Surgical History: Procedure Laterality Date . EYE SURGERY    left . ORIF HUMERUS FRACTURE  11/25/2010  Procedure: OPEN REDUCTION INTERNAL FIXATION (ORIF) PROXIMAL HUMERUS FRACTURE;  Surgeon: Carla CanadaStanley Harrison, MD;  Location: [facility];  Service: Orthopedics;  Laterality: Right; . SHOULDER SURGERY    RIGHT shoulder open treatment internal fixation with locking plate. Date of surgery November 25, 2010 HPI: Carla GripJean M Easterwoodis a 84 y.o.femalewith medical history significantfor CVA- with left sided deficits, HTN, spinal stenosis.History was obtained mostlyfrom chart review,as patient's is confused as at the time of my evaluation. Attempted contacting Care giver- got voice mail.Patient was brought to the ED today by family with complaints of confusion,and congestedcough.Also noted nausea vomiting and foul-smelling urine.Family notes patient coughing after vomiting. Right lung base opacity consistent with pneumonia or atelectasis.No evidence of pulmonary edema.  Subjective: 'What are you doing, trying to kill me?" Assessment / Plan / Recommendation CHL IP CLINICAL IMPRESSIONS 06/27/2017 Clinical Impression The MBSS completed this date was limited due to Pt refusal to take much after her first presentation of barium mixture. Pt only observed with tsp thin, straw sips thin, and two bites of puree. During that assessment, Pt exhibited reduced bolus cohesiveness with some labial spillage with tsp presentation of thin, min delay in swallow initiation with swallow trigger after spilling to pyriforms with straw sips thin and after filling valleculae with puree. Pt with trace, transient penetration of thins by teaspoon and no penetration/aspiration observed with limited straw sips thin, no significant pharyngeal residuals noted. SLP provided max total cues for encouragement for additional intake, however she refused (also with caregiver). Given Pt's clinical history, suspect that Pt may have  aspirated emesis given recent episodes of vomiting per caregiver. Caregiver also reports that Pt typically has a good appetite at home (eats mashed regular textures) and drinks mostly water. Recommend D3/mech soft with thin liquids with full feeder assist when Pt is alert and upright. OK for po meds whole with liquid or puree. Monitor for emesis post meals and consider esophageal component. SLP will follow during acute stay.  SLP Visit Diagnosis Dysphagia, oropharyngeal phase (R13.12) Attention and concentration deficit following -- Frontal lobe and executive function deficit following -- Impact on safety and function Mild aspiration risk;Risk for inadequate nutrition/hydration   CHL IP TREATMENT RECOMMENDATION 06/27/2017 Treatment Recommendations Therapy as outlined in treatment plan below   Prognosis 06/27/2017 Prognosis for Safe Diet Advancement Fair Barriers to Reach Goals Cognitive deficits Barriers/Prognosis Comment -- CHL IP DIET RECOMMENDATION 06/27/2017 SLP Diet Recommendations Dysphagia 3 (Mech soft) solids;Thin liquid Liquid Administration via Cup;Straw Medication Administration Whole meds with liquid Compensations Minimize environmental distractions;Slow rate;Small sips/bites;Follow solids with liquid Postural Changes Remain semi-upright after after feeds/meals (Comment);Seated upright at 90 degrees   CHL IP OTHER RECOMMENDATIONS 06/27/2017 Recommended Consults Consider esophageal assessment Oral Care Recommendations Oral care BID;Staff/trained caregiver to provide oral care Other Recommendations Clarify dietary restrictions   CHL IP FOLLOW UP RECOMMENDATIONS 06/27/2017 Follow up Recommendations 24 hour supervision/assistance   CHL IP FREQUENCY AND DURATION 06/27/2017 Speech Therapy Frequency (ACUTE ONLY) min 2x/week Treatment  Duration 1 week      CHL IP ORAL PHASE 06/27/2017 Oral Phase Impaired Oral - Pudding Teaspoon -- Oral - Pudding Cup -- Oral - Honey Teaspoon -- Oral - Honey Cup -- Oral - Nectar Teaspoon -- Oral  - Nectar Cup -- Oral - Nectar Straw -- Oral - Thin Teaspoon Right anterior bolus loss;Decreased bolus cohesion Oral - Thin Cup -- Oral - Thin Straw -- Oral - Puree -- Oral - Mech Soft -- Oral - Regular -- Oral - Multi-Consistency -- Oral - Pill -- Oral Phase - Comment --  CHL IP PHARYNGEAL PHASE 06/27/2017 Pharyngeal Phase Impaired Pharyngeal- Pudding Teaspoon -- Pharyngeal -- Pharyngeal- Pudding Cup -- Pharyngeal -- Pharyngeal- Honey Teaspoon -- Pharyngeal -- Pharyngeal- Honey Cup -- Pharyngeal -- Pharyngeal- Nectar Teaspoon -- Pharyngeal -- Pharyngeal- Nectar Cup -- Pharyngeal -- Pharyngeal- Nectar Straw -- Pharyngeal -- Pharyngeal- Thin Teaspoon Delayed swallow initiation-vallecula;Penetration/Aspiration during swallow Pharyngeal Material enters airway, remains ABOVE vocal cords then ejected out Pharyngeal- Thin Cup -- Pharyngeal -- Pharyngeal- Thin Straw Delayed swallow initiation-vallecula;Delayed swallow initiation-pyriform sinuses Pharyngeal -- Pharyngeal- Puree Delayed swallow initiation-vallecula;WFL Pharyngeal -- Pharyngeal- Mechanical Soft NT Pharyngeal -- Pharyngeal- Regular -- Pharyngeal -- Pharyngeal- Multi-consistency -- Pharyngeal -- Pharyngeal- Pill (No Data) Pharyngeal -- Pharyngeal Comment --  CHL IP CERVICAL ESOPHAGEAL PHASE 06/27/2017 Cervical Esophageal Phase WFL Pudding Teaspoon -- Pudding Cup -- Honey Teaspoon -- Honey Cup -- Nectar Teaspoon -- Nectar Cup -- Nectar Straw -- Thin Teaspoon -- Thin Cup -- Thin Straw -- Puree -- Mechanical Soft -- Regular -- Multi-consistency -- Pill -- Cervical Esophageal Comment -- No flowsheet data found. Thank you, Havery Moros, CCC-SLP [phone] PORTER,DABNEY 06/27/2017, 12:06 PM               Lab Results: Basic Metabolic Panel: No results for input(s): NA, K, CL, CO2, GLUCOSE, BUN, CREATININE, CALCIUM, MG, PHOS in the last 72 hours. Liver Function Tests: No results for input(s): AST, ALT, ALKPHOS, BILITOT, PROT, ALBUMIN in the last 72  hours.   CBC: No results for input(s): WBC, NEUTROABS, HGB, HCT, MCV, PLT in the last 72 hours.  Recent Results (from the past 240 hour(s))  Culture, blood (routine x 2)     Status: None (Preliminary result)   Collection Time: 06/26/17  2:40 AM  Result Value Ref Range Status   Specimen Description RIGHT ANTECUBITAL  Final   Special Requests   Final    BOTTLES DRAWN AEROBIC AND ANAEROBIC Blood Culture adequate volume   Culture   Final    NO GROWTH 3 DAYS Performed at [facility], [address] [place], [place] [address]    Report Status PENDING  Incomplete  Culture, blood (routine x 2)     Status: None (Preliminary result)   Collection Time: 06/26/17  5:22 AM  Result Value Ref Range Status   Specimen Description RIGHT ANTECUBITAL  Final   Special Requests   Final    BOTTLES DRAWN AEROBIC AND ANAEROBIC Blood Culture adequate volume   Culture   Final    NO GROWTH 3 DAYS Performed at [facility], [address], [place], [place] [address]    Report Status PENDING  Incomplete     Hospital Course: This is a 84 year old who came to the emergency department with cough and congestion and altered mental status.  She was found to have pneumonia.  She had been vomiting and likely aspirated.  She has had a severe stroke in the past which has left her with severe dysarthria.  She has had trouble swallowing in the past.  She was treated with intravenous fluids IV antibiotics and improved.  Her mental status returned to baseline.  She had speech evaluation and was felt to be at high risk of aspiration.  Family felt that they could not take care of her at home directly and plan to have her go to skilled care facility for rehab.  She is at maximum hospital benefit now.  Discharge Exam: Blood pressure (!) 162/53, pulse 68, temperature 98.7 F (37.1 C), temperature source Oral, resp. rate 17, height 5\' 6"  (1.676 m), weight 52.9 kg (116 lb 10 oz), SpO2 95 %. She is awake and alert.  Her speech is  very difficult to understand.  Her chest is clear now.  Heart is regular.  Disposition: To skilled care facility.  She will need 7 more days of Augmentin by mouth.  She will need dysphagia 3 diet.  She will receive nebulizer treatments twice a day but that may be able to be discontinued fairly soon.  She will need PT OT and speech evaluation.   Greater than 30 minutes were spent on this discharge activity Contact information for after-discharge care    Destination    [facility] .   Service:  Skilled Nursing Contact information: [address] [place] [address] [phone]              Signed: Fredirick Maudlin   06/29/2017, 8:35 AM

## 2017-07-01 LAB — CULTURE, BLOOD (ROUTINE X 2)
CULTURE: NO GROWTH
Culture: NO GROWTH
SPECIAL REQUESTS: ADEQUATE
SPECIAL REQUESTS: ADEQUATE

## 2017-08-08 ENCOUNTER — Encounter (HOSPITAL_COMMUNITY): Payer: Self-pay

## 2017-08-08 ENCOUNTER — Emergency Department (HOSPITAL_COMMUNITY): Payer: Medicare Other

## 2017-08-08 ENCOUNTER — Inpatient Hospital Stay (HOSPITAL_COMMUNITY)
Admission: EM | Admit: 2017-08-08 | Discharge: 2017-08-16 | DRG: 871 | Disposition: A | Discharge status: Hospice | Payer: Medicare Other | Attending: Pulmonary Disease | Admitting: Pulmonary Disease

## 2017-08-08 ENCOUNTER — Other Ambulatory Visit: Payer: Self-pay

## 2017-08-08 DIAGNOSIS — B351 Tinea unguium: Secondary | ICD-10-CM | POA: Diagnosis present

## 2017-08-08 DIAGNOSIS — E785 Hyperlipidemia, unspecified: Secondary | ICD-10-CM | POA: Diagnosis present

## 2017-08-08 DIAGNOSIS — L8990 Pressure ulcer of unspecified site, unspecified stage: Secondary | ICD-10-CM

## 2017-08-08 DIAGNOSIS — I69322 Dysarthria following cerebral infarction: Secondary | ICD-10-CM | POA: Diagnosis not present

## 2017-08-08 DIAGNOSIS — Z7189 Other specified counseling: Secondary | ICD-10-CM | POA: Diagnosis not present

## 2017-08-08 DIAGNOSIS — J690 Pneumonitis due to inhalation of food and vomit: Secondary | ICD-10-CM | POA: Diagnosis present

## 2017-08-08 DIAGNOSIS — E162 Hypoglycemia, unspecified: Secondary | ICD-10-CM | POA: Diagnosis not present

## 2017-08-08 DIAGNOSIS — G629 Polyneuropathy, unspecified: Secondary | ICD-10-CM | POA: Diagnosis present

## 2017-08-08 DIAGNOSIS — F418 Other specified anxiety disorders: Secondary | ICD-10-CM | POA: Diagnosis present

## 2017-08-08 DIAGNOSIS — L89152 Pressure ulcer of sacral region, stage 2: Secondary | ICD-10-CM | POA: Diagnosis present

## 2017-08-08 DIAGNOSIS — Z515 Encounter for palliative care: Secondary | ICD-10-CM | POA: Diagnosis not present

## 2017-08-08 DIAGNOSIS — I69328 Other speech and language deficits following cerebral infarction: Secondary | ICD-10-CM | POA: Diagnosis not present

## 2017-08-08 DIAGNOSIS — D649 Anemia, unspecified: Secondary | ICD-10-CM | POA: Diagnosis present

## 2017-08-08 DIAGNOSIS — Z7951 Long term (current) use of inhaled steroids: Secondary | ICD-10-CM

## 2017-08-08 DIAGNOSIS — Z66 Do not resuscitate: Secondary | ICD-10-CM | POA: Diagnosis present

## 2017-08-08 DIAGNOSIS — L89512 Pressure ulcer of right ankle, stage 2: Secondary | ICD-10-CM | POA: Diagnosis present

## 2017-08-08 DIAGNOSIS — H409 Unspecified glaucoma: Secondary | ICD-10-CM | POA: Diagnosis present

## 2017-08-08 DIAGNOSIS — L89522 Pressure ulcer of left ankle, stage 2: Secondary | ICD-10-CM | POA: Diagnosis present

## 2017-08-08 DIAGNOSIS — I69391 Dysphagia following cerebral infarction: Secondary | ICD-10-CM | POA: Diagnosis not present

## 2017-08-08 DIAGNOSIS — R0603 Acute respiratory distress: Secondary | ICD-10-CM | POA: Diagnosis not present

## 2017-08-08 DIAGNOSIS — I361 Nonrheumatic tricuspid (valve) insufficiency: Secondary | ICD-10-CM | POA: Diagnosis not present

## 2017-08-08 DIAGNOSIS — A419 Sepsis, unspecified organism: Secondary | ICD-10-CM | POA: Diagnosis present

## 2017-08-08 DIAGNOSIS — R1310 Dysphagia, unspecified: Secondary | ICD-10-CM | POA: Diagnosis present

## 2017-08-08 DIAGNOSIS — H5462 Unqualified visual loss, left eye, normal vision right eye: Secondary | ICD-10-CM | POA: Diagnosis present

## 2017-08-08 DIAGNOSIS — R0600 Dyspnea, unspecified: Secondary | ICD-10-CM

## 2017-08-08 DIAGNOSIS — Z79899 Other long term (current) drug therapy: Secondary | ICD-10-CM

## 2017-08-08 DIAGNOSIS — I10 Essential (primary) hypertension: Secondary | ICD-10-CM | POA: Diagnosis present

## 2017-08-08 LAB — COMPREHENSIVE METABOLIC PANEL
ALBUMIN: 3.3 g/dL — AB (ref 3.5–5.0)
ALT: 15 U/L (ref 14–54)
ANION GAP: 11 (ref 5–15)
AST: 32 U/L (ref 15–41)
Alkaline Phosphatase: 104 U/L (ref 38–126)
BILIRUBIN TOTAL: 0.5 mg/dL (ref 0.3–1.2)
BUN: 25 mg/dL — ABNORMAL HIGH (ref 6–20)
CHLORIDE: 100 mmol/L — AB (ref 101–111)
CO2: 27 mmol/L (ref 22–32)
Calcium: 9 mg/dL (ref 8.9–10.3)
Creatinine, Ser: 1.21 mg/dL — ABNORMAL HIGH (ref 0.44–1.00)
GFR calc Af Amer: 43 mL/min — ABNORMAL LOW (ref >60)
GFR, EST NON AFRICAN AMERICAN: 37 mL/min — AB (ref >60)
Glucose, Bld: 136 mg/dL — ABNORMAL HIGH (ref 65–99)
POTASSIUM: 4.7 mmol/L (ref 3.5–5.1)
Sodium: 138 mmol/L (ref 135–145)
TOTAL PROTEIN: 7.4 g/dL (ref 6.5–8.1)

## 2017-08-08 LAB — CBC WITH DIFFERENTIAL/PLATELET
BASOS ABS: 0 10*3/uL (ref 0.0–0.1)
BASOS PCT: 0 %
Eosinophils Absolute: 0 10*3/uL (ref 0.0–0.7)
Eosinophils Relative: 0 %
HCT: 33.2 % — ABNORMAL LOW (ref 36.0–46.0)
HEMOGLOBIN: 10.2 g/dL — AB (ref 12.0–15.0)
LYMPHS PCT: 4 %
Lymphs Abs: 0.7 10*3/uL (ref 0.7–4.0)
MCH: 27 pg (ref 26.0–34.0)
MCHC: 30.7 g/dL (ref 30.0–36.0)
MCV: 87.8 fL (ref 78.0–100.0)
Monocytes Absolute: 1.1 10*3/uL (ref 0.1–1.0)
Monocytes Relative: 7 %
NEUTROS ABS: 14.4 10*3/uL (ref 1.7–7.7)
NEUTROS PCT: 89 %
Platelets: 295 10*3/uL (ref 150–400)
RBC: 3.78 MIL/uL — AB (ref 3.87–5.11)
RDW: 21.1 % — ABNORMAL HIGH (ref 11.5–15.5)
WBC: 16.1 10*3/uL — ABNORMAL HIGH (ref 4.0–10.5)

## 2017-08-08 LAB — I-STAT CG4 LACTIC ACID, ED: LACTIC ACID, VENOUS: 1.82 mmol/L (ref 0.5–1.9)

## 2017-08-08 LAB — URINALYSIS, ROUTINE W REFLEX MICROSCOPIC
Bilirubin Urine: NEGATIVE
Glucose, UA: NEGATIVE mg/dL
Hgb urine dipstick: NEGATIVE
KETONES UR: NEGATIVE mg/dL
LEUKOCYTES UA: NEGATIVE
NITRITE: NEGATIVE
PH: 6 (ref 5.0–8.0)
Protein, ur: NEGATIVE mg/dL
SPECIFIC GRAVITY, URINE: 1.012 (ref 1.005–1.030)

## 2017-08-08 LAB — PHOSPHORUS: PHOSPHORUS: 3.6 mg/dL (ref 2.5–4.6)

## 2017-08-08 LAB — BRAIN NATRIURETIC PEPTIDE: B Natriuretic Peptide: 213 pg/mL — ABNORMAL HIGH (ref 0.0–100.0)

## 2017-08-08 LAB — PROTIME-INR
INR: 1.11
Prothrombin Time: 14.2 seconds (ref 11.4–15.2)

## 2017-08-08 LAB — MAGNESIUM: MAGNESIUM: 1.5 mg/dL — AB (ref 1.7–2.4)

## 2017-08-08 MED ORDER — GATIFLOXACIN 0.5 % OP SOLN
1.0000 [drp] | Freq: Four times a day (QID) | OPHTHALMIC | Status: DC
  Administered 2017-08-09 – 2017-08-15 (×24): 1 [drp] via OPHTHALMIC
  Filled 2017-08-08 (×2): qty 2.5

## 2017-08-08 MED ORDER — VANCOMYCIN HCL 500 MG IV SOLR
500.0000 mg | INTRAVENOUS | Status: DC
  Administered 2017-08-09: 500 mg via INTRAVENOUS
  Filled 2017-08-08 (×2): qty 500

## 2017-08-08 MED ORDER — FUROSEMIDE 40 MG PO TABS: 40.0000 mg | ORAL_TABLET | Freq: Every day | ORAL | Status: DC | PRN

## 2017-08-08 MED ORDER — ALBUTEROL SULFATE (2.5 MG/3ML) 0.083% IN NEBU: 2.5000 mg | INHALATION_SOLUTION | Freq: Four times a day (QID) | RESPIRATORY_TRACT | Status: DC

## 2017-08-08 MED ORDER — ONDANSETRON HCL 4 MG/2ML IJ SOLN: 4.0000 mg | Freq: Four times a day (QID) | INTRAMUSCULAR | Status: DC | PRN

## 2017-08-08 MED ORDER — GABAPENTIN 100 MG PO CAPS
100.0000 mg | ORAL_CAPSULE | Freq: Three times a day (TID) | ORAL | Status: DC
  Administered 2017-08-10: 100 mg via ORAL
  Filled 2017-08-08 (×2): qty 1

## 2017-08-08 MED ORDER — SODIUM CHLORIDE 0.9 % IV SOLN
INTRAVENOUS | Status: DC
  Administered 2017-08-08 – 2017-08-14 (×6): via INTRAVENOUS

## 2017-08-08 MED ORDER — MAGNESIUM SULFATE 4 GM/100ML IV SOLN
4.0000 g | Freq: Once | INTRAVENOUS | Status: AC
  Administered 2017-08-08: 4 g via INTRAVENOUS
  Filled 2017-08-08: qty 100

## 2017-08-08 MED ORDER — FLUTICASONE PROPIONATE 50 MCG/ACT NA SUSP
1.0000 | Freq: Every day | NASAL | Status: DC
  Administered 2017-08-09 – 2017-08-15 (×7): 1 via NASAL
  Filled 2017-08-08: qty 16

## 2017-08-08 MED ORDER — POLYETHYLENE GLYCOL 3350 17 G PO PACK: 17.0000 g | PACK | Freq: Every day | ORAL | Status: DC | PRN

## 2017-08-08 MED ORDER — ACETAMINOPHEN 650 MG RE SUPP: 650.0000 mg | Freq: Four times a day (QID) | RECTAL | Status: DC | PRN

## 2017-08-08 MED ORDER — IPRATROPIUM-ALBUTEROL 0.5-2.5 (3) MG/3ML IN SOLN
3.0000 mL | Freq: Four times a day (QID) | RESPIRATORY_TRACT | Status: DC
  Administered 2017-08-09 – 2017-08-11 (×12): 3 mL via RESPIRATORY_TRACT
  Filled 2017-08-08 (×12): qty 3

## 2017-08-08 MED ORDER — SODIUM CHLORIDE 0.9 % IV BOLUS
2000.0000 mL | Freq: Once | INTRAVENOUS | Status: AC
  Administered 2017-08-08: 2000 mL via INTRAVENOUS

## 2017-08-08 MED ORDER — PIPERACILLIN-TAZOBACTAM 3.375 G IVPB 30 MIN
3.3750 g | Freq: Once | INTRAVENOUS | Status: AC
  Administered 2017-08-08: 3.375 g via INTRAVENOUS
  Filled 2017-08-08: qty 50

## 2017-08-08 MED ORDER — ACETAMINOPHEN 650 MG RE SUPP
650.0000 mg | Freq: Once | RECTAL | Status: AC
  Administered 2017-08-08: 650 mg via RECTAL
  Filled 2017-08-08: qty 1

## 2017-08-08 MED ORDER — ONDANSETRON HCL 4 MG PO TABS: 4.0000 mg | ORAL_TABLET | Freq: Four times a day (QID) | ORAL | Status: DC | PRN

## 2017-08-08 MED ORDER — ENOXAPARIN SODIUM 30 MG/0.3ML ~~LOC~~ SOLN
30.0000 mg | SUBCUTANEOUS | Status: DC
  Administered 2017-08-08 – 2017-08-14 (×7): 30 mg via SUBCUTANEOUS
  Filled 2017-08-08 (×7): qty 0.3

## 2017-08-08 MED ORDER — SERTRALINE HCL 50 MG PO TABS
50.0000 mg | ORAL_TABLET | Freq: Every morning | ORAL | Status: DC
Start: 2017-08-09 — End: 2017-08-15
  Filled 2017-08-08: qty 1

## 2017-08-08 MED ORDER — VANCOMYCIN HCL IN DEXTROSE 1-5 GM/200ML-% IV SOLN
1000.0000 mg | Freq: Once | INTRAVENOUS | Status: AC
  Administered 2017-08-08: 1000 mg via INTRAVENOUS
  Filled 2017-08-08: qty 200

## 2017-08-08 MED ORDER — PIPERACILLIN-TAZOBACTAM 3.375 G IVPB
3.3750 g | Freq: Three times a day (TID) | INTRAVENOUS | Status: DC
  Administered 2017-08-09 – 2017-08-15 (×20): 3.375 g via INTRAVENOUS
  Filled 2017-08-08 (×20): qty 50

## 2017-08-08 MED ORDER — TIMOLOL MALEATE 0.25 % OP SOLN
1.0000 [drp] | Freq: Every day | OPHTHALMIC | Status: DC
  Administered 2017-08-09 – 2017-08-13 (×6): 1 [drp] via OPHTHALMIC
  Filled 2017-08-08: qty 5

## 2017-08-08 MED ORDER — SODIUM CHLORIDE 0.9 % IV BOLUS (SEPSIS): 250.0000 mL | Freq: Once | INTRAVENOUS | Status: DC

## 2017-08-08 MED ORDER — IPRATROPIUM BROMIDE 0.02 % IN SOLN: 0.5000 mg | Freq: Four times a day (QID) | RESPIRATORY_TRACT | Status: DC

## 2017-08-08 MED ORDER — LOSARTAN POTASSIUM 50 MG PO TABS
100.0000 mg | ORAL_TABLET | Freq: Every day | ORAL | Status: DC
Start: 2017-08-09 — End: 2017-08-15
  Filled 2017-08-08: qty 2

## 2017-08-08 MED ORDER — ACETAMINOPHEN 325 MG PO TABS: 650.0000 mg | ORAL_TABLET | Freq: Four times a day (QID) | ORAL | Status: DC | PRN

## 2017-08-08 NOTE — ED Provider Notes (Addendum)
[facility] EMERGENCY DEPARTMENT Provider Note   CSN: [id number] Arrival date & time: 08/08/17  1726     History   Chief Complaint Chief Complaint  Patient presents with  . Emesis  . Altered Mental Status    HPI Carla Bonilla is a 84 y.o. female.  According to the caregiver the patient has become more confused recently and has been coughing and vomiting.  The patient has problems swallowing from her old stroke  The history is provided by a caregiver. No language interpreter was used.  Altered Mental Status   This is a recurrent problem. The current episode started yesterday. The problem has not changed since onset.Associated symptoms include confusion. Risk factors: Stroke and swallowing problem.    Past Medical History:  Diagnosis Date  . Blind left eye   . CVA (cerebral vascular accident) (HCC)    speech problem  . Glaucoma   . Hearing loss   . Hyperlipidemia   . Hypertension   . Neuropathy   . Stroke [facility])     Patient Active Problem List   Diagnosis Date Noted  . Sepsis due to undetermined organism (HCC) 08/08/2017  . Depression with anxiety 08/08/2017  . Anemia 08/08/2017  . Hyperlipidemia 08/08/2017  . Metabolic encephalopathy 06/28/2017  . CAP (community acquired pneumonia) 06/25/2017  . Candida infection of genital region 05/28/2015  . Cellulitis of right lower leg 05/24/2015  . Cellulitis 05/24/2015  . GI bleeding 06/27/2014  . Heme positive stool   . Acute blood loss anemia   . Syncope and collapse 06/23/2014  . Laceration 06/23/2014  . Syncope 10/07/2013  . Spinal stenosis of lumbar region with radiculopathy 01/06/2012  . Sciatica neuralgia 01/06/2012  . Proximal humerus fracture 03/30/2011  . S/P shoulder surgery 12/08/2010  . Fracture, shoulder 12/08/2010  . Fracture of humerus, distal, right, closed 11/24/2010  . H/O: stroke with residual effects 11/24/2010  . Hypertension 11/24/2010  . Glaucoma 11/24/2010    Past Surgical History:    Procedure Laterality Date  . EYE SURGERY     left  . ORIF HUMERUS FRACTURE  11/25/2010   Procedure: OPEN REDUCTION INTERNAL FIXATION (ORIF) PROXIMAL HUMERUS FRACTURE;  Surgeon: Fuller Canada, MD;  Location: [facility] ORS;  Service: Orthopedics;  Laterality: Right;  . SHOULDER SURGERY     RIGHT shoulder open treatment internal fixation with locking plate. Date of surgery November 25, 2010     OB History   None      Home Medications    Prior to Admission medications   Medication Sig Start Date End Date Taking? Authorizing Provider  albuterol (PROVENTIL HFA;VENTOLIN HFA) 108 (90 Base) MCG/ACT inhaler Inhale 1-2 puffs into the lungs every 6 (six) hours as needed for wheezing or shortness of breath.   Yes [provider]  amLODipine (NORVASC) 5 MG tablet Take 5 mg by mouth daily.   Yes [provider]  furosemide (LASIX) 40 MG tablet Take 40 mg by mouth daily as needed for fluid.  01/29/13  Yes [provider]  gabapentin (NEURONTIN) 100 MG capsule Take 1 capsule (100 mg total) by mouth 3 (three) times daily. 07/17/13  Yes Vickki Hearing, MD  losartan (COZAAR) 100 MG tablet Take 100 mg by mouth daily. 01/29/13  Yes [provider]  metoprolol tartrate (LOPRESSOR) 25 MG tablet Take 25 mg by mouth 2 (two) times daily.     Yes [provider]  moxifloxacin (VIGAMOX) 0.5 % ophthalmic solution Place 1 drop into the left  eye 2 (two) times daily.    Yes [provider]  sertraline (ZOLOFT) 50 MG tablet Take 50 mg by mouth every morning.   Yes [provider]  timolol (TIMOPTIC) 0.25 % ophthalmic solution Place 1 drop into the right eye at bedtime.   Yes [provider]  fluticasone (FLONASE) 50 MCG/ACT nasal spray Place 1 spray into both nostrils daily. 06/29/17   Kari Baars, MD  ipratropium-albuterol (DUONEB) 0.5-2.5 (3) MG/3ML SOLN Take 3 mLs by nebulization 2 (two) times daily. Patient not taking: Reported on 08/08/2017  06/29/17   Kari Baars, MD  ondansetron (ZOFRAN) 4 MG tablet Take 1 tablet (4 mg total) by mouth every 6 (six) hours as needed for nausea. 06/29/17   Kari Baars, MD  polyethylene glycol [facility] / Ethelene Hal) packet Take 17 g by mouth daily as needed for mild constipation. 06/29/17   Kari Baars, MD    Family History Family History  Problem Relation Age of Onset  . Heart disease Unknown   . Colon cancer Neg Hx     Social History Social History   Tobacco Use  . Smoking status: Never Smoker  . Smokeless tobacco: Never Used  Substance Use Topics  . Alcohol use: No    Frequency: Never  . Drug use: No     Allergies   Patient has no known allergies.   Review of Systems Review of Systems  Unable to perform ROS: Mental status change  Psychiatric/Behavioral: Positive for confusion.     Physical Exam Updated Vital Signs BP (!) 97/42   Pulse 84   Temp (!) 102.7 F (39.3 C) (Rectal)   Resp (!) 34   Wt 52.6 kg (116 lb)   SpO2 91%   BMI 18.72 kg/m   Physical Exam  Constitutional: She appears well-developed.  HENT:  Head: Normocephalic.  Eyes: Conjunctivae and EOM are normal. No scleral icterus.  Neck: Neck supple. No thyromegaly present.  Cardiovascular: Normal rate and regular rhythm. Exam reveals no gallop and no friction rub.  No murmur heard. Pulmonary/Chest: No stridor. She has no wheezes. She has rales. She exhibits no tenderness.  Abdominal: She exhibits no distension. There is no tenderness. There is no rebound.  Musculoskeletal: Normal range of motion. She exhibits no edema.  Lymphadenopathy:    She has no cervical adenopathy.  Neurological: She exhibits normal muscle tone. Coordination normal.  Lethargic only responding to painful stimuli  Skin: No rash noted. No erythema.     ED Treatments / Results  Labs (all labs ordered are listed, but only abnormal results are displayed) Labs Reviewed  COMPREHENSIVE METABOLIC PANEL - Abnormal; Notable  for the following components:      Result Value   Chloride 100 (*)    Glucose, Bld 136 (*)    BUN 25 (*)    Creatinine, Ser 1.21 (*)    Albumin 3.3 (*)    GFR calc non Af Amer 37 (*)    GFR calc Af Amer 43 (*)    All other components within normal limits  CBC WITH DIFFERENTIAL/PLATELET - Abnormal; Notable for the following components:   WBC 16.1 (*)    RBC 3.78 (*)    Hemoglobin 10.2 (*)    HCT 33.2 (*)    RDW 21.1 (*)    All other components within normal limits  BRAIN NATRIURETIC PEPTIDE - Abnormal; Notable for the following components:   B Natriuretic Peptide 213.0 (*)    All other components within normal limits  CULTURE, BLOOD (ROUTINE X 2)  CULTURE, BLOOD (ROUTINE X 2)  PROTIME-INR  URINALYSIS, ROUTINE W REFLEX MICROSCOPIC  CBC WITH DIFFERENTIAL/PLATELET  COMPREHENSIVE METABOLIC PANEL  MAGNESIUM  PHOSPHORUS  I-STAT CG4 LACTIC ACID, ED  I-STAT CG4 LACTIC ACID, ED    EKG None  Radiology Dg Chest Portable 1 View  Result Date: 08/08/2017 CLINICAL DATA:  Shortness of breath and weakness since today. Hypertension. EXAM: PORTABLE CHEST 1 VIEW COMPARISON:  06/25/2017 FINDINGS: Right proximal humerus fixation. The Chin overlies the apices, worse on the right. Midline trachea. Normal heart size. Atherosclerosis in the transverse aorta. Mild right hemidiaphragm elevation. No pleural effusion or pneumothorax. Low lung volumes with resultant pulmonary interstitial prominence. Improved right lower lobe aeration with resolved airspace disease. Relatively linear opacity at the left lung base is felt to be new or increased since the prior exam. IMPRESSION: No definite acute cardiopulmonary disease. Resolved right lower lobe airspace disease since 06/25/2017. Relatively linear opacity at the left lung base is favored to represent subsegmental atelectasis or developing scar. Early infection or aspiration could look similar. Depending on clinical symptomatology, follow-up radiographs at 3-5  days could be performed. Aortic Atherosclerosis (ICD10-I70.0). Electronically Signed   By: Jeronimo Greaves M.D.   On: 08/08/2017 18:14    Procedures Procedures (including critical care time)  Medications Ordered in ED Medications  sodium chloride 0.9 % bolus 2,000 mL (2,000 mLs Intravenous New Bag/Given 08/08/17 1806)  vancomycin (VANCOCIN) IVPB 1000 mg/200 mL premix (0 mg Intravenous Stopped 08/08/17 1959)  piperacillin-tazobactam (ZOSYN) IVPB 3.375 g (0 g Intravenous Stopped 08/08/17 1848)  acetaminophen (TYLENOL) suppository 650 mg (650 mg Rectal Given 08/08/17 1806)     Initial Impression / Assessment and Plan / ED Course  I have reviewed the triage vital signs and the nursing notes.  Pertinent labs & imaging results that were available during my care of the patient were reviewed by me and considered in my medical decision making (see chart for details).    CRITICAL CARE Performed by: Bethann Berkshire Total critical care time:40 minutes Critical care time was exclusive of separately billable procedures and treating other patients. Critical care was necessary to treat or prevent imminent or life-threatening deterioration. Critical care was time spent personally by me on the following activities: development of treatment plan with patient and/or surrogate as well as nursing, discussions with consultants, evaluation of patient's response to treatment, examination of patient, obtaining history from patient or surrogate, ordering and performing treatments and interventions, ordering and review of laboratory studies, ordering and review of radiographic studies, pulse oximetry and re-evaluation of patient's condition. Chest x-ray does not show pneumonia.  Labs patient has elevated white blood count chemistries unremarkable.  Patient has large rales especially in the left lung.  I suspect that she is aspirated again.  She is septic with a fever of 102.  She will be placed on antibiotics and admitted to  medicine  Final Clinical Impressions(s) / ED Diagnoses   Final diagnoses:  Acute sepsis [facility])    ED Discharge Orders    None       Bethann Berkshire, MD 08/08/17 2022    Bethann Berkshire, MD 08/08/17 2023

## 2017-08-08 NOTE — H&P (Signed)
History and Physical    Carla Bonilla ZOX:[id number] DOB: 04/23/1920 DOA: 08/08/2017  PCP: Kari Baars, MD   Patient coming from: Home.  I have personally briefly reviewed patient's old medical records in [facility]  Chief Complaint: Fever.  HPI: Carla Bonilla is a 84 y.o. female with medical history significant of left eye blindness, history of CVA, glaucoma, history of hearing loss, hyperlipidemia, hypertension, peripheral neuropathy who is coming to the emergency department due to fever.  Per caregivers, the patient had a good breakfast and was at her baseline.  Around 1300, the patient was having lunch and started vomiting and having a fever.  Her mentation also decreased.  Her caregivers had her rest, but the patient started vomiting again around 1600, so she was brought into the emergency department.  EMS described that her initial O2 sat was 78%.  Family mentions that she has a chronic cough.  No further history available.  ED Course: Initial temperature 102.7 F, pulse 100, respirations 30, blood pressure 132/74 and O2 sat 85% on room air.  The patient received supplemental oxygen, the 2000 mL normal saline bolus, 650 mg of acetaminophen PR x1, vancomycin and Zosyn first dose.  Blood cultures x2 were drawn.  Lab work shows a normal urinalysis.  White count 16.1 with 89% neutrophils, 4% lymphocytes and 7% monocytes.  Hemoglobin 10.2 g/dL and platelets 811.  PT and INR were normal.  Lactic acid was 1.82 mmol/L.  Sodium 138, potassium 4.7, chloride 100 and CO2 27 mmol/L.  BUN 25, creatinine 1.21, glucose 136, calcium 9.0, phosphorus 3.6 and magnesium 1.5 mg/dL.  BMP 213 pg/mL. BNP was 213 pg/mL.  A 1 view chest radiograph showed no acute cardiopulmonary disease.  There was resolved right lower lobe airspace disease that was seen in 06/25/2017.  Relatively linear opacity of the left lung base which is favored to represent subsegmental atelectasis or developing scar.  Please see  image of full radiology report for further detail.  Review of Systems: Unable to obtain.   Past Medical History:  Diagnosis Date  . Blind left eye   . CVA (cerebral vascular accident) (HCC)    speech problem  . Glaucoma   . Hearing loss   . Hyperlipidemia   . Hypertension   . Neuropathy   . Stroke [facility])     Past Surgical History:  Procedure Laterality Date  . EYE SURGERY     left  . ORIF HUMERUS FRACTURE  11/25/2010   Procedure: OPEN REDUCTION INTERNAL FIXATION (ORIF) PROXIMAL HUMERUS FRACTURE;  Surgeon: Fuller Canada, MD;  Location: [facility]S;  Service: Orthopedics;  Laterality: Right;  . SHOULDER SURGERY     RIGHT shoulder open treatment internal fixation with locking plate. Date of surgery November 25, 2010     reports that she has never smoked. She has never used smokeless tobacco. She reports that she does not drink alcohol or use drugs.  No Known Allergies  Family History  Problem Relation Age of Onset  . Heart disease Unknown   . Colon cancer Neg Hx     Prior to Admission medications   Medication Sig Start Date End Date Taking? Authorizing Provider  albuterol (PROVENTIL HFA;VENTOLIN HFA) 108 (90 Base) MCG/ACT inhaler Inhale 1-2 puffs into the lungs every 6 (six) hours as needed for wheezing or shortness of breath.    [provider]  amLODipine (NORVASC) 5 MG tablet Take 5 mg by mouth daily.    [provider]  fluticasone (  FLONASE) 50 MCG/ACT nasal spray Place 1 spray into both nostrils daily. 06/29/17   Kari Baars, MD  furosemide (LASIX) 40 MG tablet Take 40 mg by mouth daily as needed for fluid.  01/29/13   [provider]  gabapentin (NEURONTIN) 100 MG capsule Take 1 capsule (100 mg total) by mouth 3 (three) times daily. 07/17/13   Vickki Hearing, MD  ipratropium-albuterol (DUONEB) 0.5-2.5 (3) MG/3ML SOLN Take 3 mLs by nebulization 2 (two) times daily. 06/29/17   Kari Baars, MD  losartan (COZAAR) 100 MG tablet Take 100 mg by  mouth daily. 01/29/13   [provider]  metoprolol tartrate (LOPRESSOR) 25 MG tablet Take 25 mg by mouth 2 (two) times daily.      [provider]  moxifloxacin (VIGAMOX) 0.5 % ophthalmic solution Place 1 drop into the left eye 2 (two) times daily.     [provider]  ondansetron (ZOFRAN) 4 MG tablet Take 1 tablet (4 mg total) by mouth every 6 (six) hours as needed for nausea. 06/29/17   Kari Baars, MD  polyethylene glycol [facility] / Ethelene Hal) packet Take 17 g by mouth daily as needed for mild constipation. 06/29/17   Kari Baars, MD  sertraline (ZOLOFT) 50 MG tablet Take 50 mg by mouth every morning.    [provider]  timolol (TIMOPTIC) 0.25 % ophthalmic solution Place 1 drop into the right eye at bedtime.    [provider]    Physical Exam: Vitals:   08/08/17 1741 08/08/17 1800 08/08/17 1830 08/08/17 2000  BP:  (!) 129/47 (!) 108/40 (!) 97/42  Pulse:  94 96 84  Resp:  (!) 25 (!) 35 (!) 34  Temp: (!) 102.7 F (39.3 C)     TempSrc: Rectal     SpO2:  91% 91% 91%  Weight:        Constitutional: Acutely ill elderly, frail female. Eyes: Bilateral colobomas.  Left eye cornea looks hazy. ENMT: Mucous membranes are dry. Posterior pharynx clear of any exudate or lesions. Neck: Normal, supple, no masses, no thyromegaly Respiratory: Decreased breath sounds on bases with bilateral rhonchi, no crackles. Normal respiratory effort. No accessory muscle use.  Cardiovascular: Regular rate and rhythm, no murmurs / rubs / gallops. No extremity edema. 2+ pedal pulses. No carotid bruits.  Abdomen: Soft, tenderness, no masses palpated. No hepatosplenomegaly. Bowel sounds positive.  Musculoskeletal: no clubbing / cyanosis. Good passive ROM, no contractures.  Mildly relaxed muscle tone.  Skin: Positive onychomycosis of toenails.  Stage II decubiti ulcer and multiple areas of ecchymosis on extremities, particularly on lower extremities.  Please see  pictures below. Neurologic: Stuporous.  Responds to tactile stimuli. Psychiatric: Responds to touch, but does not respond to verbal stimuli.   Labs on Admission: I have personally reviewed following labs and imaging studies  CBC: Recent Labs  Lab 08/08/17 1751  WBC 16.1*  NEUTROABS 14.4  HGB 10.2*  HCT 33.2*  MCV 87.8  PLT 295   Basic Metabolic Panel: Recent Labs  Lab 08/08/17 1751  NA 138  K 4.7  CL 100*  CO2 27  GLUCOSE 136*  BUN 25*  CREATININE 1.21*  CALCIUM 9.0   GFR: Estimated Creatinine Clearance: 23.6 mL/min (A) (by C-G formula based on SCr of 1.21 mg/dL (H)). Liver Function Tests: Recent Labs  Lab 08/08/17 1751  AST 32  ALT 15  ALKPHOS 104  BILITOT 0.5  PROT 7.4  ALBUMIN 3.3*   No results for input(s): LIPASE, AMYLASE in the last  168 hours. No results for input(s): AMMONIA in the last 168 hours. Coagulation Profile: Recent Labs  Lab 08/08/17 1751  INR 1.11   Cardiac Enzymes: No results for input(s): CKTOTAL, CKMB, CKMBINDEX, TROPONINI in the last 168 hours. BNP (last 3 results) No results for input(s): PROBNP in the last 8760 hours. HbA1C: No results for input(s): HGBA1C in the last 72 hours. CBG: No results for input(s): GLUCAP in the last 168 hours. Lipid Profile: No results for input(s): CHOL, HDL, LDLCALC, TRIG, CHOLHDL, LDLDIRECT in the last 72 hours. Thyroid Function Tests: No results for input(s): TSH, T4TOTAL, FREET4, T3FREE, THYROIDAB in the last 72 hours. Anemia Panel: No results for input(s): VITAMINB12, FOLATE, FERRITIN, TIBC, IRON, RETICCTPCT in the last 72 hours. Urine analysis:    Component Value Date/Time   COLORURINE YELLOW 08/08/2017 1745   APPEARANCEUR CLEAR 08/08/2017 1745   LABSPEC 1.012 08/08/2017 1745   PHURINE 6.0 08/08/2017 1745   GLUCOSEU NEGATIVE 08/08/2017 1745   HGBUR NEGATIVE 08/08/2017 1745   BILIRUBINUR NEGATIVE 08/08/2017 1745   KETONESUR NEGATIVE 08/08/2017 1745   PROTEINUR NEGATIVE 08/08/2017 1745    UROBILINOGEN 0.2 10/07/2013 1325   NITRITE NEGATIVE 08/08/2017 1745   LEUKOCYTESUR NEGATIVE 08/08/2017 1745    Radiological Exams on Admission: Dg Chest Portable 1 View  Result Date: 08/08/2017 CLINICAL DATA:  Shortness of breath and weakness since today. Hypertension. EXAM: PORTABLE CHEST 1 VIEW COMPARISON:  06/25/2017 FINDINGS: Right proximal humerus fixation. The Chin overlies the apices, worse on the right. Midline trachea. Normal heart size. Atherosclerosis in the transverse aorta. Mild right hemidiaphragm elevation. No pleural effusion or pneumothorax. Low lung volumes with resultant pulmonary interstitial prominence. Improved right lower lobe aeration with resolved airspace disease. Relatively linear opacity at the left lung base is felt to be new or increased since the prior exam. IMPRESSION: No definite acute cardiopulmonary disease. Resolved right lower lobe airspace disease since 06/25/2017. Relatively linear opacity at the left lung base is favored to represent subsegmental atelectasis or developing scar. Early infection or aspiration could look similar. Depending on clinical symptomatology, follow-up radiographs at 3-5 days could be performed. Aortic Atherosclerosis (ICD10-I70.0). Electronically Signed   By: Jeronimo Greaves M.D.   On: 08/08/2017 18:14   10/08/2013 2D echocardiogram ------------------------------------------------------------------- LV EF: 65% -  70%  ------------------------------------------------------------------- Indications:   Syncope 780.2.  ------------------------------------------------------------------- History:  PMH:  Stroke. Risk factors: Hypertension.  ------------------------------------------------------------------- Study Conclusions  - Left ventricle: The cavity size was mildly reduced. Wall thickness was increased in a pattern of mild LVH. Systolic function was vigorous. The estimated ejection fraction was in the range of 65% to  70%. Doppler parameters are consistent with abnormal left ventricular relaxation (grade 1 diastolic dysfunction). - Aortic valve: There was trivial regurgitation. - Pulmonary arteries: PA peak pressure: 33 mm Hg (S).  EKG: Independently reviewed. Vent. rate 101 BPM PR interval * ms QRS duration 84 ms QT/QTc 324/420 ms P-R-T axes 77 72 26 Sinus tachycardia Borderline T wave abnormalities  Assessment/Plan Principal Problem:   Sepsis due to undetermined organism (HCC) Likely due to aspiration pneumonia. Admit to stepdown/inpatient. Continue supplemental oxygen. Aspiration precautions. Keep n.p.o. Swallow evaluation in a.m. Continue maintenance IV fluids. Continue vancomycin per pharmacy. Continue Zosyn per pharmacy. Follow-up blood cultures and sensitivity.  Active Problems:   Hypomagnesemia Replacing. Follow-up level as needed.    Hypertension Hold antihypertensives for now. Monitor blood pressure.    Peripheral neuropathy Continue Neurontin 100 mg p.o. 3 times daily.    Glaucoma Continue timolol and quinolone  drops.    Depression with anxiety Continue Zoloft 50 mg p.o. daily.    Anemia Around recent baseline. Normal anemia panel 2016. Monitor hematocrit and hemoglobin.    Hyperlipidemia Not on medical therapy. Heart healthy diet.    DVT prophylaxis: Lovenox SQ. Code Status: Full code. Family Communication: Spoke to her caregiver Tammy and her daughter. Disposition Plan: Admit for IV antibiotic therapy for several days. Consults called:  Admission status: Inpatient/stepdown.   Bobette Mo MD [facility] Pager [phone].  If 7PM-7AM, please contact night-coverage [email or web address] Password [facility]  08/08/2017, 8:14 PM   This document was prepared using Dragon voice recognition software and may contain some unintended transcription errors.

## 2017-08-08 NOTE — Progress Notes (Signed)
Pharmacy Antibiotic Note  Carla Bonilla is a 84 y.o. female admitted on 08/08/2017 with sepsis.  Pharmacy has been consulted for Vancomycin and zosyn dosing.  Plan: Vancomycin  loading dose, then  IV every 24 hours.  Goal trough 15-20 mcg/mL. Zosyn 3.375g IV q8h (4 hour infusion).  F/U cxs and clinical progress Monitor v/s, labs, and levels as indicated  Weight: 116 lb (52.6 kg)  Temp (24hrs), Avg:101.5 F (38.6 C), Min:100.3 F (37.9 C), Max:102.7 F (39.3 C)  Recent Labs  Lab 08/08/17 1751 08/08/17 1759  WBC 16.1*  --   CREATININE 1.21*  --   LATICACIDVEN  --  1.82    Estimated Creatinine Clearance: 23.6 mL/min (A) (by C-G formula based on SCr of 1.21 mg/dL (H)).    No Known Allergies  Antimicrobials this admission: Vancomycin 5/20 >>  Zosyn 5/20 >>   Dose adjustments this admission: n/a  Microbiology results: 5/20 BCx: pending 5/20 UCx: pending  MRSA PCR:   Thank you for allowing pharmacy to be a part of this patient's care.  Elder Cyphers, BS Loura Back, [place] Clinical Pharmacist Pager [phone] 08/08/2017 8:58 PM

## 2017-08-08 NOTE — ED Triage Notes (Signed)
Per ems, pt started vomiting approx 30 min ago.  EMS says family said pt was not as reactive as usual since she started vomiting.  Reports the house was very hot.  Pt has productive cough but ems says family states is chronic.  Pt's initial room air o2 sat was 78%.  EMS uses inhaler prn.  EMS put pt on 100%nrb and o2 sat increased to 95%.

## 2017-08-09 ENCOUNTER — Inpatient Hospital Stay (HOSPITAL_COMMUNITY): Payer: Medicare Other

## 2017-08-09 DIAGNOSIS — L8990 Pressure ulcer of unspecified site, unspecified stage: Secondary | ICD-10-CM

## 2017-08-09 LAB — COMPREHENSIVE METABOLIC PANEL
ALBUMIN: 2.2 g/dL — AB (ref 3.5–5.0)
ALT: 12 U/L — AB (ref 14–54)
ANION GAP: 5 (ref 5–15)
AST: 24 U/L (ref 15–41)
Alkaline Phosphatase: 63 U/L (ref 38–126)
BUN: 23 mg/dL — ABNORMAL HIGH (ref 6–20)
CHLORIDE: 106 mmol/L (ref 101–111)
CO2: 26 mmol/L (ref 22–32)
CREATININE: 1.17 mg/dL — AB (ref 0.44–1.00)
Calcium: 7.7 mg/dL — ABNORMAL LOW (ref 8.9–10.3)
GFR calc non Af Amer: 39 mL/min — ABNORMAL LOW (ref >60)
GFR, EST AFRICAN AMERICAN: 45 mL/min — AB (ref >60)
GLUCOSE: 121 mg/dL — AB (ref 65–99)
Potassium: 3.8 mmol/L (ref 3.5–5.1)
SODIUM: 137 mmol/L (ref 135–145)
Total Bilirubin: 0.4 mg/dL (ref 0.3–1.2)
Total Protein: 5 g/dL — ABNORMAL LOW (ref 6.5–8.1)

## 2017-08-09 LAB — CBC WITH DIFFERENTIAL/PLATELET
BASOS ABS: 0 10*3/uL (ref 0.0–0.1)
BASOS PCT: 0 %
EOS ABS: 0 10*3/uL (ref 0.0–0.7)
Eosinophils Relative: 0 %
HCT: 27.8 % — ABNORMAL LOW (ref 36.0–46.0)
Hemoglobin: 8.4 g/dL — ABNORMAL LOW (ref 12.0–15.0)
LYMPHS PCT: 11 %
Lymphs Abs: 1.9 10*3/uL (ref 0.7–4.0)
MCH: 27 pg (ref 26.0–34.0)
MCHC: 30.2 g/dL (ref 30.0–36.0)
MCV: 89.4 fL (ref 78.0–100.0)
MONO ABS: 1.2 10*3/uL — AB (ref 0.1–1.0)
Monocytes Relative: 7 %
NEUTROS PCT: 82 %
Neutro Abs: 14.5 10*3/uL — ABNORMAL HIGH (ref 1.7–7.7)
PLATELETS: 247 10*3/uL (ref 150–400)
RBC: 3.11 MIL/uL — ABNORMAL LOW (ref 3.87–5.11)
RDW: 21 % — ABNORMAL HIGH (ref 11.5–15.5)
WBC: 17.6 10*3/uL — AB (ref 4.0–10.5)

## 2017-08-09 LAB — MRSA PCR SCREENING: MRSA by PCR: NEGATIVE

## 2017-08-09 MED ORDER — HALOPERIDOL LACTATE 5 MG/ML IJ SOLN: 3.0000 mg | Freq: Once | INTRAMUSCULAR | Status: DC

## 2017-08-09 MED ORDER — HALOPERIDOL LACTATE 5 MG/ML IJ SOLN
3.0000 mg | Freq: Once | INTRAMUSCULAR | Status: AC
  Administered 2017-08-09: 3 mg via INTRAVENOUS
  Filled 2017-08-09: qty 1

## 2017-08-09 MED ORDER — METHYLPREDNISOLONE SODIUM SUCC 125 MG IJ SOLR
80.0000 mg | Freq: Once | INTRAMUSCULAR | Status: AC
  Administered 2017-08-09: 80 mg via INTRAVENOUS
  Filled 2017-08-09: qty 2

## 2017-08-09 MED ORDER — LORAZEPAM 2 MG/ML IJ SOLN
0.5000 mg | Freq: Once | INTRAMUSCULAR | Status: AC
  Administered 2017-08-09: 0.5 mg via INTRAVENOUS
  Filled 2017-08-09: qty 1

## 2017-08-09 MED ORDER — METOPROLOL TARTRATE 5 MG/5ML IV SOLN
2.5000 mg | Freq: Four times a day (QID) | INTRAVENOUS | Status: DC
  Administered 2017-08-09 – 2017-08-15 (×23): 2.5 mg via INTRAVENOUS
  Filled 2017-08-09 (×22): qty 5

## 2017-08-09 MED ORDER — ORAL CARE MOUTH RINSE
15.0000 mL | Freq: Two times a day (BID) | OROMUCOSAL | Status: DC
  Administered 2017-08-09 – 2017-08-16 (×15): 15 mL via OROMUCOSAL

## 2017-08-09 MED ORDER — LACTATED RINGERS IV BOLUS
1000.0000 mL | Freq: Once | INTRAVENOUS | Status: AC
  Administered 2017-08-09: 1000 mL via INTRAVENOUS

## 2017-08-09 NOTE — Progress Notes (Signed)
PROGRESS NOTE    Carla Bonilla  ZOX:[id number] DOB: 1920/08/29 DOA: 08/08/2017 PCP: Kari Baars, MD   Brief Narrative: Carla Bonilla is a 84 y.o. female with medical history significant of left eye blindness, history of CVA, glaucoma, history of hearing loss, hyperlipidemia, hypertension, peripheral neuropathy who is coming to the emergency department due to fever.  Per caregivers, the patient had a good breakfast and was at her baseline.  Around 1300, the patient was having lunch and started vomiting and having a fever.  Her mentation also decreased.  Her caregivers had her rest, but the patient started vomiting again around 1600, so she was brought into the emergency department.  EMS described that her initial O2 sat was 78%.  One of her caregivers mentioned that she has a chronic cough.  No further history available from caregivers or the patient. She has an niece who lives in [place] (Carla Bonilla), who is apparently her POA.  In the ER, initial temperature 102.7 F, pulse 100, respirations 30, blood pressure 132/74 and O2 sat 85% on room air.  The patient received supplemental oxygen, the 2000 mL normal saline bolus, 650 mg of acetaminophen PR x1, vancomycin and Zosyn first dose.  Blood cultures x2 were drawn, which continue to be negative today.  Lab work shows a normal urinalysis.  White count 16.1 with 89% neutrophils, 4% lymphocytes and 7% monocytes.  Hemoglobin 10.2 g/dL and platelets 811.  PT and INR were normal.  Lactic acid was 1.82 mmol/L.  Sodium 138, potassium 4.7, chloride 100 and CO2 27 mmol/L.  BUN 25, creatinine 1.21, glucose 136, calcium 9.0, phosphorus 3.6 and magnesium 1.5 mg/dL.  BMP 213 pg/mL. BNP was 213 pg/mL.    Imaging: A 1 view chest radiograph showed no acute cardiopulmonary disease.  There was resolved right lower lobe airspace disease that was seen in 06/25/2017.  Relatively linear opacity of the left lung base which is favored to represent subsegmental  atelectasis or developing scar.  Assessment & Plan:   Principal Problem:   Sepsis due to undetermined organism (HCC) Suspect aspiration pneumonia. Continue supplemental oxygen. Continue aspiration precautions. She has been evaluated by SLP. Recommendations are appreciated. Continue DuoNeb every 2 hours. Added PRN nebs as well. Solumedrol 80 mg IVPx1. I will get repeat chest radiograph in the morning. Follow-up blood cultures and sensitivity. Continue vancomycin per pharmacy. Continue Zosyn 3.375 g every 8 hours IVPB.  Active Problems:   Hypertension Metoprolol 2.5 mg IVP every 6 hours while n.p.o. Monitor blood pressure and heart rate.    Glaucoma Continue timolol and quinolone drops.    Depression with anxiety Continue Zoloft 50 mg p.o. at bedtime once cleared for oral intake.    Anemia Decrease likely from rehydration after hemoconcentration. Monitor hematocrit and hemoglobin. Transfuse as needed.    Hyperlipidemia Not on medical therapy.   Heart healthy diet.    Hypomagnesemia Replaced. Follow level as needed.    Peripheral neuropathy Continue Neurontin 100 mg p.o. 3 times daily once cleared for diet.    Pressure injury of skin   Stage II sacral lower ulcer   Stage II bilateral ankle pressure injury Continue close monitoring and local care.   DVT prophylaxis: Lovenox SQ. Code Status: Full code. Family Communication: None at bedside. Disposition Plan:  Continue IV antibiotics for at least 2-3 more days. Consultants:   None.  Procedures: Echocardiogram for evaluation of SEM still pending.   Antimicrobials:   Vancomycin per pharmacy day #2  Zosyn 3.375 IVPB  every 8 hours day #2.   Subjective: The patient seems to be more active today, but she is disoriented to place, time, date and situation.  Currently n.p.o. after being evaluated by SLP earlier.  Objective: Vitals:   08/09/17 1930 08/09/17 2000 08/09/17 2028 08/09/17 2100  BP: (!) 121/42  (!) 132/48    Pulse: 84 90    Resp: (!) 22 (!) 22    Temp:  100 F (37.8 C)  99.4 F (37.4 C)  TempSrc:  Axillary  Oral  SpO2: 97% 95% 93%   Weight:      Height:        Intake/Output Summary (Last 24 hours) at 08/09/2017 2130 Last data filed at 08/09/2017 1809 Gross per 24 hour  Intake 1017.99 ml  Output 450 ml  Net 567.99 ml   Filed Weights   08/08/17 1732 08/08/17 2129 08/09/17 0500  Weight: 52.6 kg (116 lb) 52.6 kg (115 lb 15.4 oz) 53.5 kg (117 lb 15.1 oz)    Examination:  General exam: Appears initially mildly restless.  Respiratory system: Tachypneic at 24 bpm, decreased breath sounds on bases with bilateral rhonchi.  No accessory muscle use. Cardiovascular system: S1 & S2 heard, RRR.  Positive 2/6 SEM, no JVD, rubs, gallops or clicks. No pedal edema. Gastrointestinal system: Abdomen is nondistended, soft and nontender. No organomegaly or masses felt. Normal bowel sounds heard. Central nervous system: Alert, disoriented to place, time and situation.  No gross focal neurological deficits. Extremities: No clubbing or cyanosis. Skin: Positive ecchymosis of toenails, stage II decubiti ulcer on sacral area.  Multiple areas of ecchymosis on all extremities, but particularly on lower extremities. Psychiatry: Restless and disoriented.  Data Reviewed: I have personally reviewed following labs and imaging studies  CBC: Recent Labs  Lab 08/08/17 1751 08/09/17 0409  WBC 16.1* 17.6*  NEUTROABS 14.4 14.5*  HGB 10.2* 8.4*  HCT 33.2* 27.8*  MCV 87.8 89.4  PLT 295 247   Basic Metabolic Panel: Recent Labs  Lab 08/08/17 1751 08/09/17 0409  NA 138 137  K 4.7 3.8  CL 100* 106  CO2 27 26  GLUCOSE 136* 121*  BUN 25* 23*  CREATININE 1.21* 1.17*  CALCIUM 9.0 7.7*  MG 1.5*  --   PHOS 3.6  --    GFR: Estimated Creatinine Clearance: 24.3 mL/min (A) (by C-G formula based on SCr of 1.17 mg/dL (H)). Liver Function Tests: Recent Labs  Lab 08/08/17 1751 08/09/17 0409  AST 32  24  ALT 15 12*  ALKPHOS 104 63  BILITOT 0.5 0.4  PROT 7.4 5.0*  ALBUMIN 3.3* 2.2*   No results for input(s): LIPASE, AMYLASE in the last 168 hours. No results for input(s): AMMONIA in the last 168 hours. Coagulation Profile: Recent Labs  Lab 08/08/17 1751  INR 1.11   Cardiac Enzymes: No results for input(s): CKTOTAL, CKMB, CKMBINDEX, TROPONINI in the last 168 hours. BNP (last 3 results) No results for input(s): PROBNP in the last 8760 hours. HbA1C: No results for input(s): HGBA1C in the last 72 hours. CBG: No results for input(s): GLUCAP in the last 168 hours. Lipid Profile: No results for input(s): CHOL, HDL, LDLCALC, TRIG, CHOLHDL, LDLDIRECT in the last 72 hours. Thyroid Function Tests: No results for input(s): TSH, T4TOTAL, FREET4, T3FREE, THYROIDAB in the last 72 hours. Anemia Panel: No results for input(s): VITAMINB12, FOLATE, FERRITIN, TIBC, IRON, RETICCTPCT in the last 72 hours. Sepsis Labs: Recent Labs  Lab 08/08/17 1759  LATICACIDVEN 1.82    Recent Results (  from the past 240 hour(s))  Culture, blood (Routine x 2)     Status: None (Preliminary result)   Collection Time: 08/08/17  5:52 PM  Result Value Ref Range Status   Specimen Description BLOOD LEFT WRIST  Final   Special Requests   Final    BOTTLES DRAWN AEROBIC AND ANAEROBIC Blood Culture results may not be optimal due to an inadequate volume of blood received in culture bottles   Culture   Final    NO GROWTH < 24 HOURS Performed at [facility], [address] [place], [place] [address]    Report Status PENDING  Incomplete  Culture, blood (Routine x 2)     Status: None (Preliminary result)   Collection Time: 08/08/17  5:52 PM  Result Value Ref Range Status   Specimen Description BLOOD RIGHT ARM  Final   Special Requests   Final    BOTTLES DRAWN AEROBIC AND ANAEROBIC Blood Culture adequate volume   Culture   Final    NO GROWTH < 24 HOURS Performed at [facility], [address], [place], [place]  [address]    Report Status PENDING  Incomplete  MRSA PCR Screening     Status: None   Collection Time: 08/08/17  9:25 PM  Result Value Ref Range Status   MRSA by PCR NEGATIVE NEGATIVE Final    Comment:        The GeneXpert MRSA Assay (FDA approved for NASAL specimens only), is one component of a comprehensive MRSA colonization surveillance program. It is not intended to diagnose MRSA infection nor to guide or monitor treatment for MRSA infections. Performed at [facility], [address] [place], [place] [address]          Radiology Studies: Dg Chest Portable 1 View  Result Date: 08/08/2017 CLINICAL DATA:  Shortness of breath and weakness since today. Hypertension. EXAM: PORTABLE CHEST 1 VIEW COMPARISON:  06/25/2017 FINDINGS: Right proximal humerus fixation. The Chin overlies the apices, worse on the right. Midline trachea. Normal heart size. Atherosclerosis in the transverse aorta. Mild right hemidiaphragm elevation. No pleural effusion or pneumothorax. Low lung volumes with resultant pulmonary interstitial prominence. Improved right lower lobe aeration with resolved airspace disease. Relatively linear opacity at the left lung base is felt to be new or increased since the prior exam. IMPRESSION: No definite acute cardiopulmonary disease. Resolved right lower lobe airspace disease since 06/25/2017. Relatively linear opacity at the left lung base is favored to represent subsegmental atelectasis or developing scar. Early infection or aspiration could look similar. Depending on clinical symptomatology, follow-up radiographs at 3-5 days could be performed. Aortic Atherosclerosis (ICD10-I70.0). Electronically Signed   By: Jeronimo Greaves M.D.   On: 08/08/2017 18:14        Scheduled Meds: . enoxaparin (LOVENOX) injection  30 mg Subcutaneous Q24H  . fluticasone  1 spray Each Nare Daily  . gabapentin  100 mg Oral TID  . gatifloxacin  1 drop Left Eye QID  . ipratropium-albuterol  3 mL  Nebulization Q6H  . losartan  100 mg Oral Daily  . mouth rinse  15 mL Mouth Rinse BID  . sertraline  50 mg Oral q morning - 10a  . timolol  1 drop Right Eye QHS   Continuous Infusions: . sodium chloride 50 mL/hr at 08/09/17 2113  . piperacillin-tazobactam (ZOSYN)  IV 3.375 g (08/09/17 2113)  . vancomycin Stopped (08/09/17 2129)     LOS: 1 day   Time spent: About 35 minutes were spent during  the process of   Bobette Mo, MD [facility] Pager [phone].  If 7PM-7AM, please contact night-coverage [email or web address] Password [email or web address] 08/09/2017, 9:30 PM

## 2017-08-09 NOTE — Evaluation (Signed)
Clinical/Bedside Swallow Evaluation Patient Details  Name: Carla Bonilla MRN: [id number] Date of Birth: 1921-01-29  Today's Date: 08/09/2017 Time: SLP Start Time (ACUTE ONLY): 1944 SLP Stop Time (ACUTE ONLY): 2010 SLP Time Calculation (min) (ACUTE ONLY): 26 min  Past Medical History:  Past Medical History:  Diagnosis Date  . Blind left eye   . CVA (cerebral vascular accident) (HCC)    speech problem  . Glaucoma   . Hearing loss   . Hyperlipidemia   . Hypertension   . Neuropathy   . Stroke [facility])    Past Surgical History:  Past Surgical History:  Procedure Laterality Date  . EYE SURGERY     left  . ORIF HUMERUS FRACTURE  11/25/2010   Procedure: OPEN REDUCTION INTERNAL FIXATION (ORIF) PROXIMAL HUMERUS FRACTURE;  Surgeon: Fuller Canada, MD;  Location: [facility];  Service: Orthopedics;  Laterality: Right;  . SHOULDER SURGERY     RIGHT shoulder open treatment internal fixation with locking plate. Date of surgery November 25, 2010   HPI:  Carla Bonilla a 84 y.o.femalewith medical history significant ofleft eye blindness, history of CVA, glaucoma, history of hearing loss, hyperlipidemia, hypertension, peripheral neuropathy who is coming to the emergency department due to fever.Per caregivers, the patient had a good breakfast and was at her baseline. Around 1300, the patient was having lunch and started vomiting and having a fever. Her mentation also decreased. Her caregivers had her rest, but the patient started vomiting again around 1600, so she was brought into the emergency department. EMS described that her initial O2 sat was 78%. Family mentions that she has a chronic cough. Pt was febrile and elevated WBC count. She is known to this SLP from previous admissions. She has aphasia from a previous stroke. BSE requested. She had MBSS completed during admission last month with recommendation for D3 and thin liquids, however the MBSS was limited due to Pt unable/unwilling to  fully participate.    Assessment / Plan / Recommendation Clinical Impression  Pt presents with audible breathing, congested cough, and wheezing prior to po presentation. Pt had MBSS 05/29/15 and 06/27/17 with similar results (expiratory wheeze prior to PO, delay in swallow initiation, penetration observed, no aspiration observed, however difficult to visualize with recommendation for mech soft and thin; monitor for emesis post meals).  Pt alert and talkative, however decreased intelligibility from previous stroke in 2009. She consumed several ice chips, bites sherbet, and small sips of water and exhibited delayed signs of aspiration characterized by coughing and decreased O2 saturations (96-90). Although she exhibits signs of aspiration, this appears to be her typical presentation. Pt appears to have a strong desire to eat by mouth and stated, "mmm that's good" several times. She also tends to be impulsive as she talks when eating/drinking and takes large sips despite cues, which result in significant coughing. Recommend palliative care consult to help establish goals of care, as I do not know if Pt has family nearby (I seem to recall that she may have a nephew who lives out of state). Her caregiver is not present. Pt reportedly had episodes of emesis prior to admission, which has also been a pattern from previous admission- could consider esophageal assessment, however doubt there would be anything to change. Will keep NPO tonight except for ice chips per RN and OK for po medications crushed in puree as able, however consider comfort/safest diet of D1/puree and thins if diet is requested. SLP will follow per goals of care. I do not  feel that a repeat MBSS will provide much more information as she had one last month and participation was limited and visualization was also difficult.    SLP Visit Diagnosis: Dysphagia, unspecified (R13.10)    Aspiration Risk  Moderate aspiration risk    Diet Recommendation  NPO;Ice chips PRN after oral care   Medication Administration: Crushed with puree Supervision: Staff to assist with self feeding Compensations: Small sips/bites;Slow rate Postural Changes: Seated upright at 90 degrees;Remain upright for at least 30 minutes after po intake    Other  Recommendations Oral Care Recommendations: Oral care QID;Oral care prior to ice chip/H20;Staff/trained caregiver to provide oral care Other Recommendations: Clarify dietary restrictions   Follow up Recommendations 24 hour supervision/assistance      Frequency and Duration min 2x/week  1 week       Prognosis Prognosis for Safe Diet Advancement: Guarded Barriers to Reach Goals: Severity of deficits      Swallow Study   General Date of Onset: 08/08/17 HPI: Carla Bonilla a 84 y.o.femalewith medical history significant ofleft eye blindness, history of CVA, glaucoma, history of hearing loss, hyperlipidemia, hypertension, peripheral neuropathy who is coming to the emergency department due to fever.Per caregivers, the patient had a good breakfast and was at her baseline. Around 1300, the patient was having lunch and started vomiting and having a fever. Her mentation also decreased. Her caregivers had her rest, but the patient started vomiting again around 1600, so she was brought into the emergency department. EMS described that her initial O2 sat was 78%. Family mentions that she has a chronic cough. Pt was febrile and elevated WBC count. She is known to this SLP from previous admissions. She has aphasia from a previous stroke. BSE requested. She had MBSS completed during admission last month with recommendation for D3 and thin liquids, however the MBSS was limited due to Pt unable/unwilling to fully participate.  Type of Study: Bedside Swallow Evaluation Previous Swallow Assessment: MBSS 06/28/17 D3/thin; limited assessment Diet Prior to this Study: NPO Temperature Spikes Noted: Yes Respiratory  Status: Nasal cannula History of Recent Intubation: No Behavior/Cognition: Alert;Cooperative;Pleasant mood;Requires cueing Oral Cavity Assessment: Within Functional Limits Oral Care Completed by SLP: No Oral Cavity - Dentition: Edentulous Vision: Functional for self-feeding Self-Feeding Abilities: Needs assist;Total assist Patient Positioning: Upright in bed Baseline Vocal Quality: Normal(vocal quality clear, however congestion heard and suspected ) Volitional Cough: Congested;Strong Volitional Swallow: Able to elicit    Oral/Motor/Sensory Function Overall Oral Motor/Sensory Function: Mild impairment   Ice Chips Ice chips: Within functional limits Presentation: Spoon   Thin Liquid Thin Liquid: Impaired Presentation: Straw Pharyngeal  Phase Impairments: Cough - Delayed    Nectar Thick Nectar Thick Liquid: Within functional limits Presentation: Spoon   Honey Thick     Puree Puree: Not tested   Solid   Thank you,  Havery Moros, CCC-SLP [phone]    Solid: Not tested        PORTER,DABNEY 08/09/2017,8:17 PM

## 2017-08-09 NOTE — Progress Notes (Signed)
New order for bipap- RT aware and in room setting up

## 2017-08-09 NOTE — Progress Notes (Signed)
Pt very agitated- yelling out constantly, pulling oxygen off and trying to get OOB. RN tried reorienting pt and turning music on to relax pt. RN educated pt on POC as well as medications received.  Can not understand most of what pt says but pt does deny c/o pain.  Dr. Robb Matar paged and made aware. Ativan 0.5mg  x1 IV ordered. Given Will continue to monitor pt

## 2017-08-09 NOTE — Progress Notes (Signed)
Dr. Robb Matar to floor to look at pt d/t RR 40s oxygen 91% on 4L.

## 2017-08-09 NOTE — Progress Notes (Signed)
Dr. Robb Matar made aware of pt not being alert enough to take po medications as well as Bp running low in the 70's to 90s. No new orders. Will continue to monitor.

## 2017-08-09 NOTE — Plan of Care (Signed)
Patient has become more alert this shift and able to answer questions better. Patient has remained NPO this shift d/t aspiration risk and orientation. Orientation has improved, awaiting SLP assessment at this time. Caregiver reported that at baseline patient has a good appetite and eats well at home.

## 2017-08-09 NOTE — Progress Notes (Signed)
Dr. Robb Matar made aware of urine output of of urine after bolus as well as BP still running in the low 90s with MAP <65. No new orders. Will continue to monitor.

## 2017-08-09 NOTE — Progress Notes (Signed)
Pts oxygen level decreased to 88-89 on 2L [place] and RR increased to 35-40/min after the ativan was given. Pts oxygen turned up to 4L O2 per [place]-RT made aware.  Current oxygen 96% on 4L -RR 32.  Will continue to montior pt

## 2017-08-10 ENCOUNTER — Inpatient Hospital Stay (HOSPITAL_COMMUNITY): Payer: Medicare Other

## 2017-08-10 ENCOUNTER — Encounter (HOSPITAL_COMMUNITY): Payer: Self-pay | Admitting: Primary Care

## 2017-08-10 DIAGNOSIS — Z7189 Other specified counseling: Secondary | ICD-10-CM

## 2017-08-10 DIAGNOSIS — I361 Nonrheumatic tricuspid (valve) insufficiency: Secondary | ICD-10-CM

## 2017-08-10 DIAGNOSIS — Z515 Encounter for palliative care: Secondary | ICD-10-CM

## 2017-08-10 DIAGNOSIS — A419 Sepsis, unspecified organism: Principal | ICD-10-CM

## 2017-08-10 LAB — CBC WITH DIFFERENTIAL/PLATELET
Basophils Absolute: 0 10*3/uL (ref 0.0–0.1)
Basophils Relative: 0 %
EOS ABS: 0 10*3/uL (ref 0.0–0.7)
Eosinophils Relative: 0 %
HCT: 30.6 % — ABNORMAL LOW (ref 36.0–46.0)
HEMOGLOBIN: 9.3 g/dL — AB (ref 12.0–15.0)
LYMPHS ABS: 0.9 10*3/uL (ref 0.7–4.0)
Lymphocytes Relative: 7 %
MCH: 26.7 pg (ref 26.0–34.0)
MCHC: 30.4 g/dL (ref 30.0–36.0)
MCV: 87.9 fL (ref 78.0–100.0)
MONO ABS: 0.2 10*3/uL (ref 0.1–1.0)
MONOS PCT: 2 %
Neutro Abs: 12.1 10*3/uL — ABNORMAL HIGH (ref 1.7–7.7)
Neutrophils Relative %: 91 %
Platelets: 284 10*3/uL (ref 150–400)
RBC: 3.48 MIL/uL — ABNORMAL LOW (ref 3.87–5.11)
RDW: 21.2 % — ABNORMAL HIGH (ref 11.5–15.5)
WBC: 13.2 10*3/uL — ABNORMAL HIGH (ref 4.0–10.5)

## 2017-08-10 LAB — RENAL FUNCTION PANEL
ANION GAP: 8 (ref 5–15)
Albumin: 2.4 g/dL — ABNORMAL LOW (ref 3.5–5.0)
BUN: 21 mg/dL — ABNORMAL HIGH (ref 6–20)
CO2: 25 mmol/L (ref 22–32)
Calcium: 7.8 mg/dL — ABNORMAL LOW (ref 8.9–10.3)
Chloride: 107 mmol/L (ref 101–111)
Creatinine, Ser: 1 mg/dL (ref 0.44–1.00)
GFR calc Af Amer: 54 mL/min — ABNORMAL LOW (ref >60)
GFR calc non Af Amer: 47 mL/min — ABNORMAL LOW (ref >60)
Glucose, Bld: 115 mg/dL — ABNORMAL HIGH (ref 65–99)
POTASSIUM: 3.7 mmol/L (ref 3.5–5.1)
Phosphorus: 3.3 mg/dL (ref 2.5–4.6)
SODIUM: 140 mmol/L (ref 135–145)

## 2017-08-10 LAB — ECHOCARDIOGRAM COMPLETE
Height: 63 in
WEIGHTICAEL: 1904.77 [oz_av]

## 2017-08-10 LAB — TYPE AND SCREEN
ABO/RH(D): B POS
Antibody Screen: NEGATIVE

## 2017-08-10 MED ORDER — FUROSEMIDE 10 MG/ML IJ SOLN
20.0000 mg | Freq: Once | INTRAMUSCULAR | Status: AC
  Administered 2017-08-10: 20 mg via INTRAVENOUS
  Filled 2017-08-10: qty 2

## 2017-08-10 NOTE — Progress Notes (Signed)
Pt placed on [facility] BIPAP due to increased WOB. BIPAP is plugged into red outlet. Cushion placed on bridge of pt's nose for breakdown protection

## 2017-08-10 NOTE — Progress Notes (Signed)
*  PRELIMINARY RESULTS* Echocardiogram 2D Echocardiogram has been performed.  Stacey Drain 08/10/2017, 4:05 PM

## 2017-08-10 NOTE — Progress Notes (Signed)
SLP Cancellation Note  Patient Details Name: Carla Bonilla MRN: [id number] DOB: [birth year]   Cancelled treatment:       Reason Eval/Treat Not Completed: Medical issues which prohibited therapy; Pt currently on bi-PAP and not alert- SLP unable to see for ongoing dysphagia intervention. SLP will follow.  Thank you,  Havery Moros, CCC-SLP [phone]    PORTER,DABNEY 08/10/2017, 1:00 PM

## 2017-08-10 NOTE — Consult Note (Addendum)
Consultation Note Date: 08/10/2017   Patient Name: Carla Bonilla  DOB: Jul 30, 1920  MRN: [id number]  Age / Sex: 84 y.o., female  PCP: Kari Baars, MD Referring Physician: Kari Baars, MD  Reason for Consultation: Establishing goals of care  HPI/Patient Profile: 84 y.o. 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   Clinical Assessment and Goals of Care: Carla Bonilla is lying quietly in bed.  She appears frail, acutely/chronically ill.  She is very thin, nonverbal at this time with BiPAP in place.  She does not make eye contact or try to communicate with me in any meaningful way.  There is no family at bedside at this time. Conference with nursing staff related to plan of care.  Call to niece/HC POA, Carla Bonilla at [phone].  Left generic voicemail message. Call to caregiver Tammy at [phone], left generic voicemail message.  Call to ICU later in the day, no family present at bedside.  Healthcare power of attorney HCPOA -niece, Carla Bonilla.  Living will/HC POA paperwork are in epic document list, completed April 2009.   SUMMARY OF RECOMMENDATIONS   At this point continue to treat the treatable but no intubation. Reach out to family for further discussions related to goals of care.  Code Status/Advance Care Planning:  Limited code -DO NOT INTUBATE per chart.  Symptom Management:   Per hospitalist, no additional needs at this time.  Palliative Prophylaxis:   Frequent Pain Assessment and Turn Reposition  Additional Recommendations (Limitations, Scope, Preferences):  Treat the treatable but no intubation.  Psycho-social/Spiritual:   Desire for further Chaplaincy  support:no  Additional Recommendations: Caregiving  Support/Resources and Education on Hospice  Prognosis:   Unable to determine, based on outcomes.  Days to weeks would not be surprising without the use of BiPAP.  Discharge Planning: To be determined, based on outcomes and family's desire for direction of care.      Primary Diagnoses: Present on Admission: . Sepsis due to undetermined organism (HCC) . Hypertension . Glaucoma . Depression with anxiety . Anemia . Hyperlipidemia . Hypomagnesemia   I have reviewed the medical record, interviewed the patient and family, and examined the patient. The following aspects are pertinent.  Past Medical History:  Diagnosis Date  . Blind left eye   . CVA (cerebral vascular accident) (HCC)    speech problem  . Glaucoma   . Hearing loss   . Hyperlipidemia   . Hypertension   . Neuropathy   . Stroke [facility])    Social History   Socioeconomic History  . Marital status: Widowed    Spouse name: Not on file  . Number of children: Not on file  . Years of education: 12+  . Highest education level: Not on file  Occupational History    Employer: RETIRED  Social Needs  . Financial resource strain: Not on  file  . Food insecurity:    Worry: Not on file    Inability: Not on file  . Transportation needs:    Medical: Not on file    Non-medical: Not on file  Tobacco Use  . Smoking status: Never Smoker  . Smokeless tobacco: Never Used  Substance and Sexual Activity  . Alcohol use: No    Frequency: Never  . Drug use: No  . Sexual activity: Not Currently  Lifestyle  . Physical activity:    Days per week: Not on file    Minutes per session: Not on file  . Stress: Not on file  Relationships  . Social connections:    Talks on phone: Not on file    Gets together: Not on file    Attends religious service: Not on file    Active member of club or organization: Not on file    Attends meetings of clubs or organizations: Not on file     Relationship status: Not on file  Other Topics Concern  . Not on file  Social History Narrative   ** Merged History Encounter **       Family History  Problem Relation Age of Onset  . Heart disease Unknown   . Colon cancer Neg Hx    Scheduled Meds: . enoxaparin (LOVENOX) injection  30 mg Subcutaneous Q24H  . fluticasone  1 spray Each Nare Daily  . gabapentin  100 mg Oral TID  . gatifloxacin  1 drop Left Eye QID  . ipratropium-albuterol  3 mL Nebulization Q6H  . losartan  100 mg Oral Daily  . mouth rinse  15 mL Mouth Rinse BID  . metoprolol tartrate  2.5 mg Intravenous Q6H  . sertraline  50 mg Oral q morning - 10a  . timolol  1 drop Right Eye QHS   Continuous Infusions: . sodium chloride 50 mL/hr at 08/10/17 0556  . piperacillin-tazobactam (ZOSYN)  IV Stopped (08/10/17 0941)  . vancomycin Stopped (08/09/17 2129)   PRN Meds:.acetaminophen **OR** acetaminophen, furosemide, ondansetron **OR** ondansetron (ZOFRAN) IV, polyethylene glycol Medications Prior to Admission:  Prior to Admission medications   Medication Sig Start Date End Date Taking? Authorizing Provider  albuterol (PROVENTIL HFA;VENTOLIN HFA) 108 (90 Base) MCG/ACT inhaler Inhale 1-2 puffs into the lungs every 6 (six) hours as needed for wheezing or shortness of breath.   Yes [provider]  amLODipine (NORVASC) 5 MG tablet Take 5 mg by mouth daily.   Yes [provider]  furosemide (LASIX) 40 MG tablet Take 40 mg by mouth daily as needed for fluid.  01/29/13  Yes [provider]  gabapentin (NEURONTIN) 100 MG capsule Take 1 capsule (100 mg total) by mouth 3 (three) times daily. 07/17/13  Yes Vickki Hearing, MD  losartan (COZAAR) 100 MG tablet Take 100 mg by mouth daily. 01/29/13  Yes [provider]  metoprolol tartrate (LOPRESSOR) 25 MG tablet Take 25 mg by mouth 2 (two) times daily.     Yes [provider]  moxifloxacin (VIGAMOX) 0.5 % ophthalmic solution Place 1 drop  into the left eye 2 (two) times daily.    Yes [provider]  sertraline (ZOLOFT) 50 MG tablet Take 50 mg by mouth every morning.   Yes [provider]  timolol (TIMOPTIC) 0.25 % ophthalmic solution Place 1 drop into the right eye at bedtime.   Yes [provider]  fluticasone (FLONASE) 50 MCG/ACT nasal spray Place 1 spray into both nostrils daily. 06/29/17  Kari Baars, MD  ipratropium-albuterol (DUONEB) 0.5-2.5 (3) MG/3ML SOLN Take 3 mLs by nebulization 2 (two) times daily. Patient not taking: Reported on 08/08/2017 06/29/17   Kari Baars, MD  ondansetron (ZOFRAN) 4 MG tablet Take 1 tablet (4 mg total) by mouth every 6 (six) hours as needed for nausea. 06/29/17   Kari Baars, MD  polyethylene glycol [facility] / Ethelene Hal) packet Take 17 g by mouth daily as needed for mild constipation. 06/29/17   Kari Baars, MD   No Known Allergies Review of Systems  Unable to perform ROS: Patient nonverbal    Physical Exam  Constitutional: No distress.  Appears frail, chronically ill, does not make eye contact, mostly nonverbal at baseline  HENT:  Head: Atraumatic.  Cardiovascular: Normal rate.  Pulmonary/Chest:  BiPAP in place.  Abdominal: Soft. She exhibits no distension.  Musculoskeletal: She exhibits no edema.  Neurological:  Eyes are open but no eye contact, mostly nonverbal at baseline  Skin: Skin is warm.  Psychiatric:  Cooperative  Nursing note and vitals reviewed.   Vital Signs: BP (!) 116/50   Pulse 69   Temp (!) 97.5 F (36.4 C) (Axillary)   Resp (!) 22   Ht  (1.6 m)   Wt 54 kg (119 lb 0.8 oz)   SpO2 95%   BMI 21.09 kg/m  Pain Scale: 0-10   Pain Score: Asleep   SpO2: SpO2: 95 % O2 Device:SpO2: 95 % O2 Flow Rate: .O2 Flow Rate (L/min): 4 L/min  IO: Intake/output summary:   Intake/Output Summary (Last 24 hours) at 08/10/2017 1256 Last data filed at 08/10/2017 1100 Gross per 24 hour  Intake 1490.83 ml  Output 1850 ml  Net  -359.17 ml    LBM: Last BM Date: 08/10/17 Baseline Weight: Weight: 52.6 kg (116 lb) Most recent weight: Weight: 54 kg (119 lb 0.8 oz)     Palliative Assessment/Data:   Flowsheet Rows     Most Recent Value  Intake Tab  Referral Department  Hospitalist  Unit at Time of Referral  Intermediate Care Unit  Palliative Care Primary Diagnosis  Pulmonary  Date Notified  08/09/17  Palliative Care Type  New Palliative care  Reason for referral  Clarify Goals of Care  Date of Admission  08/08/17  Date first seen by Palliative Care  08/10/17  # of days Palliative referral response time  1 Day(s)  # of days IP prior to Palliative referral  1  Clinical Assessment  Palliative Performance Scale Score  20%  Pain Max last 24 hours  Not able to report  Pain Min Last 24 hours  Not able to report  Dyspnea Max Last 24 Hours  Not able to report  Dyspnea Min Last 24 hours  Not able to report  Psychosocial & Spiritual Assessment  Palliative Care Outcomes  Patient/Family meeting held?  Yes  Patient/Family wishes: Interventions discontinued/not started   Mechanical Ventilation      Time In: 0930 Time Out: 1000 Time Total: 30 minutes Greater than 50%  of this time was spent counseling and coordinating care related to the above assessment and plan.  Signed by: Katheran Awe, NP   Please contact Palliative Medicine Team phone at [phone] for questions and concerns.  For individual provider: See Loretha Stapler

## 2017-08-10 NOTE — Plan of Care (Signed)
Night shift stepdown coverage note.  The patient was seen due to restlessness.  She was tachypneic in the high 30s.  She had rhonchi and wheezing, which was improving after getting a neb treatment.  Lorazepam 0.5 mg IVP x1 was given without significant results.  Subsequently, I called the patient's niece, CarlaJoan Mayans at [phone] to describe to her Carla Bonilla with clinical condition.  Given dyspnea, I told Ms. Vear Clock, that Ms. Esterwood could require ET intubation and mechanical ventilation at some point.  However, Ms. Vear Clock clarify her aunt would like everything done, except for artificial life support or tube feedings.  Full code was changed to a partial code.  Ms. Monica Becton was also given 2.5 mg of metoprolol IVP earlier, then was given 3 mg of Haldol IVP x1 and placed on BiPAP ventilation, which seems to have helped her rest.  Sanda Klein, MD.

## 2017-08-10 NOTE — Progress Notes (Signed)
Subjective: She was admitted on the 20th with presumed aspiration pneumonia.  She has been seen by speech therapy and is felt to be high risk for aspiration.  Chest x-ray done last night which I have personally reviewed shows increasing left lower lobe infiltrate consistent with aspiration pneumonia.  She was septic on admission.  Her CODE STATUS has been clarified and she now has partial CODE STATUS.  Objective: Vital signs in last 24 hours: Temp:  [98.1 F (36.7 C)-100 F (37.8 C)] 98.9 F (37.2 C) (05/22 0400) Pulse Rate:  [59-95] 69 (05/22 0614) Resp:  [19-33] 20 (05/22 0614) BP: (95-143)/(42-83) 112/51 (05/22 0614) SpO2:  [91 %-100 %] 93 % (05/22 0614) FiO2 (%):  [40 %] 40 % (05/22 0614) Weight:  [54 kg (119 lb 0.8 oz)] 54 kg (119 lb 0.8 oz) (05/22 0500) Weight change: 1.383 kg (3 lb 0.8 oz) Last BM Date: (unknown)  Intake/Output from previous day: 05/21 0701 - 05/22 0700 In: -  Out: 950 [Urine:950]  PHYSICAL EXAM General appearance: Poorly responsive on BiPAP.  (She received Ativan last night) Resp: Bilateral rhonchi and wheezing Cardio: regular rate and rhythm, S1, S2 normal, no murmur, click, rub or gallop GI: soft, non-tender; bowel sounds normal; no masses,  no organomegaly Extremities: extremities normal, atraumatic, no cyanosis or edema Multiple ecchymotic areas  Lab Results:  Results for orders placed or performed during the hospital encounter of 08/08/17 (from the past 48 hour(s))  Urinalysis, Routine w reflex microscopic     Status: None   Collection Time: 08/08/17  5:45 PM  Result Value Ref Range   Color, Urine YELLOW YELLOW   APPearance CLEAR CLEAR   Specific Gravity, Urine 1.012 1.005 - 1.030   pH 6.0 5.0 - 8.0   Glucose, UA NEGATIVE NEGATIVE mg/dL   Hgb urine dipstick NEGATIVE NEGATIVE   Bilirubin Urine NEGATIVE NEGATIVE   Ketones, ur NEGATIVE NEGATIVE mg/dL   Protein, ur NEGATIVE NEGATIVE mg/dL   Nitrite NEGATIVE NEGATIVE   Leukocytes, UA NEGATIVE  NEGATIVE    Comment: Performed at [facility], [address], [place], [place] [address]  Comprehensive metabolic panel     Status: Abnormal   Collection Time: 08/08/17  5:51 PM  Result Value Ref Range   Sodium 138 135 - 145 mmol/L   Potassium 4.7 3.5 - 5.1 mmol/L   Chloride 100 (L) 101 - 111 mmol/L   CO2 27 22 - 32 mmol/L   Glucose, Bld 136 (H) 65 - 99 mg/dL   BUN 25 (H) 6 - 20 mg/dL   Creatinine, Ser 1.21 (H) 0.44 - 1.00 mg/dL   Calcium 9.0 8.9 - 10.3 mg/dL   Total Protein 7.4 6.5 - 8.1 g/dL   Albumin 3.3 (L) 3.5 - 5.0 g/dL   AST 32 15 - 41 U/L   ALT 15 14 - 54 U/L   Alkaline Phosphatase 104 38 - 126 U/L   Total Bilirubin 0.5 0.3 - 1.2 mg/dL   GFR calc non Af Amer 37 (L) >60 mL/min   GFR calc Af Amer 43 (L) >60 mL/min    Comment: (NOTE) The eGFR has been calculated using the CKD EPI equation. This calculation has not been validated in all clinical situations. eGFR's persistently <60 mL/min signify possible Chronic Kidney Disease.    Anion gap 11 5 - 15    Comment: Performed at [facility], [address], [place], [place] [address]  CBC with Differential     Status: Abnormal   Collection Time:  08/08/17  5:51 PM  Result Value Ref Range   WBC 16.1 (H) 4.0 - 10.5 K/uL   RBC 3.78 (L) 3.87 - 5.11 MIL/uL   Hemoglobin 10.2 (L) 12.0 - 15.0 g/dL   HCT 33.2 (L) 36.0 - 46.0 %   MCV 87.8 78.0 - 100.0 fL   MCH 27.0 26.0 - 34.0 pg   MCHC 30.7 30.0 - 36.0 g/dL   RDW 21.1 (H) 11.5 - 15.5 %   Platelets 295 150 - 400 K/uL    Comment: SPECIMEN CHECKED FOR CLOTS PLATELET COUNT CONFIRMED BY SMEAR    Neutrophils Relative % 89 %   Neutro Abs 14.4 1.7 - 7.7 K/uL   Lymphocytes Relative 4 %   Lymphs Abs 0.7 0.7 - 4.0 K/uL   Monocytes Relative 7 %   Monocytes Absolute 1.1 0.1 - 1.0 K/uL   Eosinophils Relative 0 %   Eosinophils Absolute 0.0 0.0 - 0.7 K/uL   Basophils Relative 0 %   Basophils Absolute 0.0 0.0 - 0.1 K/uL   WBC Morphology WHITE COUNT CONFIRMED ON SMEAR     Comment:  Performed at [facility], [address], [place], [place] [address]  Protime-INR     Status: None   Collection Time: 08/08/17  5:51 PM  Result Value Ref Range   Prothrombin Time 14.2 11.4 - 15.2 seconds   INR 1.11     Comment: Performed at [facility], [address], [place], [place] [address]  Magnesium     Status: Abnormal   Collection Time: 08/08/17  5:51 PM  Result Value Ref Range   Magnesium 1.5 (L) 1.7 - 2.4 mg/dL    Comment: Performed at [facility], [address] [place], [place] [address]  Phosphorus     Status: None   Collection Time: 08/08/17  5:51 PM  Result Value Ref Range   Phosphorus 3.6 2.5 - 4.6 mg/dL    Comment: Performed at [facility], [address] [place], [place] [address]  Culture, blood (Routine x 2)     Status: None (Preliminary result)   Collection Time: 08/08/17  5:52 PM  Result Value Ref Range   Specimen Description BLOOD LEFT WRIST    Special Requests      BOTTLES DRAWN AEROBIC AND ANAEROBIC Blood Culture results may not be optimal due to an inadequate volume of blood received in culture bottles   Culture      NO GROWTH < 24 HOURS Performed at [facility], [address] [place], [place] [address]    Report Status PENDING   Culture, blood (Routine x 2)     Status: None (Preliminary result)   Collection Time: 08/08/17  5:52 PM  Result Value Ref Range   Specimen Description BLOOD RIGHT ARM    Special Requests      BOTTLES DRAWN AEROBIC AND ANAEROBIC Blood Culture adequate volume   Culture      NO GROWTH < 24 HOURS Performed at [facility], [address], [place],  [address]    Report Status PENDING   I-Stat CG4 Lactic Acid, ED     Status: None   Collection Time: 08/08/17  5:59 PM  Result Value Ref Range   Lactic Acid, Venous 1.82 0.5 - 1.9 mmol/L  Brain natriuretic peptide     Status: Abnormal   Collection Time: 08/08/17  6:05 PM  Result Value Ref Range   B Natriuretic Peptide 213.0 (H) 0.0 - 100.0 pg/mL    Comment: Performed  at [facility], [address] [place], [place] [address]  MRSA PCR Screening     Status: None   Collection Time: 08/08/17  9:25 PM  Result Value Ref Range   MRSA by PCR NEGATIVE NEGATIVE    Comment:        The GeneXpert MRSA Assay (FDA approved for NASAL specimens only), is one component of a comprehensive MRSA colonization surveillance program. It is not intended to diagnose MRSA infection nor to guide or monitor treatment for MRSA infections. Performed at [facility], [address] [place], [place] [address]   CBC with Differential     Status: Abnormal   Collection Time: 08/09/17  4:09 AM  Result Value Ref Range   WBC 17.6 (H) 4.0 - 10.5 K/uL   RBC 3.11 (L) 3.87 - 5.11 MIL/uL   Hemoglobin 8.4 (L) 12.0 - 15.0 g/dL   HCT 27.8 (L) 36.0 - 46.0 %   MCV 89.4 78.0 - 100.0 fL   MCH 27.0 26.0 - 34.0 pg   MCHC 30.2 30.0 - 36.0 g/dL   RDW 21.0 (H) 11.5 - 15.5 %   Platelets 247 150 - 400 K/uL   Neutrophils Relative % 82 %   Lymphocytes Relative 11 %   Monocytes Relative 7 %   Eosinophils Relative 0 %   Basophils Relative 0 %   Neutro Abs 14.5 (H) 1.7 - 7.7 K/uL   Lymphs Abs 1.9 0.7 - 4.0 K/uL   Monocytes Absolute 1.2 (H) 0.1 - 1.0 K/uL   Eosinophils Absolute 0.0 0.0 - 0.7 K/uL   Basophils Absolute 0.0 0.0 - 0.1 K/uL   WBC Morphology MILD LEFT SHIFT (1-5% METAS, OCC MYELO, OCC BANDS)     Comment: Performed at [facility], [address], [place], [place] [address]  Comprehensive metabolic panel     Status: Abnormal   Collection Time: 08/09/17  4:09 AM  Result Value Ref Range   Sodium 137 135 - 145 mmol/L   Potassium 3.8 3.5 - 5.1 mmol/L    Comment: DELTA CHECK NOTED   Chloride 106 101 - 111 mmol/L   CO2 26 22 - 32 mmol/L   Glucose, Bld 121 (H) 65 - 99 mg/dL   BUN 23 (H) 6 - 20 mg/dL   Creatinine, Ser 1.17 (H) 0.44 - 1.00 mg/dL   Calcium 7.7 (L) 8.9 - 10.3 mg/dL   Total Protein 5.0 (L) 6.5 - 8.1 g/dL   Albumin 2.2 (L) 3.5 - 5.0 g/dL   AST 24 15 - 41 U/L   ALT 12 (L) 14  - 54 U/L   Alkaline Phosphatase 63 38 - 126 U/L   Total Bilirubin 0.4 0.3 - 1.2 mg/dL   GFR calc non Af Amer 39 (L) >60 mL/min   GFR calc Af Amer 45 (L) >60 mL/min    Comment: (NOTE) The eGFR has been calculated using the CKD EPI equation. This calculation has not been validated in all clinical situations. eGFR's persistently <60 mL/min signify possible Chronic Kidney Disease.    Anion gap 5 5 - 15    Comment: Performed at [facility], [address] [place], [place] [address]  CBC with Differential     Status: Abnormal   Collection Time: 08/10/17  5:07 AM  Result Value Ref Range   WBC 13.2 (H) 4.0 - 10.5 K/uL   RBC 3.48 (L) 3.87 - 5.11 MIL/uL   Hemoglobin 9.3 (L) 12.0 - 15.0 g/dL   HCT 30.6 (L) 36.0 - 46.0 %  MCV 87.9 78.0 - 100.0 fL   MCH 26.7 26.0 - 34.0 pg   MCHC 30.4 30.0 - 36.0 g/dL   RDW 21.2 (H) 11.5 - 15.5 %   Platelets 284 150 - 400 K/uL   Neutrophils Relative % 91 %   Neutro Abs 12.1 (H) 1.7 - 7.7 K/uL   Lymphocytes Relative 7 %   Lymphs Abs 0.9 0.7 - 4.0 K/uL   Monocytes Relative 2 %   Monocytes Absolute 0.2 0.1 - 1.0 K/uL   Eosinophils Relative 0 %   Eosinophils Absolute 0.0 0.0 - 0.7 K/uL   Basophils Relative 0 %   Basophils Absolute 0.0 0.0 - 0.1 K/uL    Comment: Performed at [facility], [address] [place], [place] [address]  Renal function panel     Status: Abnormal   Collection Time: 08/10/17  5:07 AM  Result Value Ref Range   Sodium 140 135 - 145 mmol/L   Potassium 3.7 3.5 - 5.1 mmol/L   Chloride 107 101 - 111 mmol/L   CO2 25 22 - 32 mmol/L   Glucose, Bld 115 (H) 65 - 99 mg/dL   BUN 21 (H) 6 - 20 mg/dL   Creatinine, Ser 1.00 0.44 - 1.00 mg/dL   Calcium 7.8 (L) 8.9 - 10.3 mg/dL   Phosphorus 3.3 2.5 - 4.6 mg/dL   Albumin 2.4 (L) 3.5 - 5.0 g/dL   GFR calc non Af Amer 47 (L) >60 mL/min   GFR calc Af Amer 54 (L) >60 mL/min    Comment: (NOTE) The eGFR has been calculated using the CKD EPI equation. This calculation has not been validated in all  clinical situations. eGFR's persistently <60 mL/min signify possible Chronic Kidney Disease.    Anion gap 8 5 - 15    Comment: Performed at [facility], [address] [place], [place] [address]  Type and screen [facility]     Status: None   Collection Time: 08/10/17  5:07 AM  Result Value Ref Range   ABO/RH(D) B POS    Antibody Screen NEG    Sample Expiration      08/13/2017 Performed at [facility], [address] [place], [place] [address]     ABGS No results for input(s): PHART, PO2ART, TCO2, HCO3 in the last 72 hours.  Invalid input(s): PCO2 CULTURES Recent Results (from the past 240 hour(s))  Culture, blood (Routine x 2)     Status: None (Preliminary result)   Collection Time: 08/08/17  5:52 PM  Result Value Ref Range Status   Specimen Description BLOOD LEFT WRIST  Final   Special Requests   Final    BOTTLES DRAWN AEROBIC AND ANAEROBIC Blood Culture results may not be optimal due to an inadequate volume of blood received in culture bottles   Culture   Final    NO GROWTH < 24 HOURS Performed at [facility], [address], [place], [place] [address]    Report Status PENDING  Incomplete  Culture, blood (Routine x 2)     Status: None (Preliminary result)   Collection Time: 08/08/17  5:52 PM  Result Value Ref Range Status   Specimen Description BLOOD RIGHT ARM  Final   Special Requests   Final    BOTTLES DRAWN AEROBIC AND ANAEROBIC Blood Culture adequate volume   Culture   Final    NO GROWTH < 24 HOURS Performed at [facility], [address], [place], [place] [address]    Report Status PENDING  Incomplete  MRSA PCR Screening     Status: None   Collection Time: 08/08/17  9:25 PM  Result Value Ref Range Status   MRSA by PCR NEGATIVE NEGATIVE Final    Comment:        The GeneXpert MRSA Assay (FDA approved for NASAL specimens only), is one component of a comprehensive MRSA colonization surveillance program. It is not intended to diagnose  MRSA infection nor to guide or monitor treatment for MRSA infections. Performed at [facility], [address] [place], [place] [address]    Studies/Results: Dg Chest Port 1 View  Result Date: 08/09/2017 CLINICAL DATA:  Shortness of breath with agitation EXAM: PORTABLE CHEST 1 VIEW COMPARISON:  08/08/2017, 06/25/2017 FINDINGS: Surgical plate and multiple screw fixation of the right humerus for old fracture deformity. Stable cardiomediastinal silhouette with vascular congestion. Mild diffuse interstitial opacity consistent with background pulmonary edema. Aortic atherosclerosis. Increasing airspace disease at the left lung base. No pneumothorax. IMPRESSION: 1. Increasing airspace disease at the left lung base, suspect for pneumonia 2. Vascular congestion with mild pulmonary edema. Electronically Signed   By: Donavan Foil M.D.   On: 08/09/2017 23:44   Dg Chest Portable 1 View  Result Date: 08/08/2017 CLINICAL DATA:  Shortness of breath and weakness since today. Hypertension. EXAM: PORTABLE CHEST 1 VIEW COMPARISON:  06/25/2017 FINDINGS: Right proximal humerus fixation. The Chin overlies the apices, worse on the right. Midline trachea. Normal heart size. Atherosclerosis in the transverse aorta. Mild right hemidiaphragm elevation. No pleural effusion or pneumothorax. Low lung volumes with resultant pulmonary interstitial prominence. Improved right lower lobe aeration with resolved airspace disease. Relatively linear opacity at the left lung base is felt to be new or increased since the prior exam. IMPRESSION: No definite acute cardiopulmonary disease. Resolved right lower lobe airspace disease since 06/25/2017. Relatively linear opacity at the left lung base is favored to represent subsegmental atelectasis or developing scar. Early infection or aspiration could look similar. Depending on clinical symptomatology, follow-up radiographs at 3-5 days could be performed. Aortic Atherosclerosis (ICD10-I70.0).  Electronically Signed   By: Abigail Miyamoto M.D.   On: 08/08/2017 18:14    Medications:  Prior to Admission:  Medications Prior to Admission  Medication Sig Dispense Refill Last Dose  . albuterol (PROVENTIL HFA;VENTOLIN HFA) 108 (90 Base) MCG/ACT inhaler Inhale 1-2 puffs into the lungs every 6 (six) hours as needed for wheezing or shortness of breath.   unknown  . amLODipine (NORVASC) 5 MG tablet Take 5 mg by mouth daily.   08/08/2017 at Unknown time  . furosemide (LASIX) 40 MG tablet Take 40 mg by mouth daily as needed for fluid.    08/07/2017 at Unknown time  . gabapentin (NEURONTIN) 100 MG capsule Take 1 capsule (100 mg total) by mouth 3 (three) times daily. 90 capsule 5 08/08/2017 at 1300  . losartan (COZAAR) 100 MG tablet Take 100 mg by mouth daily.   08/08/2017 at Unknown time  . metoprolol tartrate (LOPRESSOR) 25 MG tablet Take 25 mg by mouth 2 (two) times daily.     08/08/2017 at 820a  . moxifloxacin (VIGAMOX) 0.5 % ophthalmic solution Place 1 drop into the left eye 2 (two) times daily.    08/08/2017 at Unknown time  . sertraline (ZOLOFT) 50 MG tablet Take 50 mg by mouth every morning.   08/08/2017 at Unknown time  . timolol (TIMOPTIC) 0.25 % ophthalmic solution Place 1 drop into the right eye at bedtime.   08/07/2017 at Unknown time  .  fluticasone (FLONASE) 50 MCG/ACT nasal spray Place 1 spray into both nostrils daily.  2 unknown  . ipratropium-albuterol (DUONEB) 0.5-2.5 (3) MG/3ML SOLN Take 3 mLs by nebulization 2 (two) times daily. (Patient not taking: Reported on 08/08/2017) 360 mL  Not Taking at Unknown time  . ondansetron (ZOFRAN) 4 MG tablet Take 1 tablet (4 mg total) by mouth every 6 (six) hours as needed for nausea. 20 tablet 0 unknown  . polyethylene glycol (MIRALAX / GLYCOLAX) packet Take 17 g by mouth daily as needed for mild constipation. 14 each 0 unknown   Scheduled: . enoxaparin (LOVENOX) injection  30 mg Subcutaneous Q24H  . fluticasone  1 spray Each Nare Daily  . gabapentin  100  mg Oral TID  . gatifloxacin  1 drop Left Eye QID  . ipratropium-albuterol  3 mL Nebulization Q6H  . losartan  100 mg Oral Daily  . mouth rinse  15 mL Mouth Rinse BID  . metoprolol tartrate  2.5 mg Intravenous Q6H  . sertraline  50 mg Oral q morning - 10a  . timolol  1 drop Right Eye QHS   Continuous: . sodium chloride 50 mL/hr at 08/10/17 0556  . piperacillin-tazobactam (ZOSYN)  IV 3.375 g (08/10/17 0541)  . vancomycin Stopped (08/09/17 2129)   QQI:[id number] **OR** acetaminophen, furosemide, ondansetron **OR** ondansetron (ZOFRAN) IV, polyethylene glycol  Assesment: She was admitted with sepsis from aspiration pneumonia.  She is on treatment for that.  She had more difficulty breathing last night and there was concern that she might have to be intubated but her CODE STATUS has been clarified and she would not be a candidate for intubation and mechanical ventilation.  She had 2 severe strokes approximately 10 years ago and has had significant dysarthria since then and I think has had more problems with aspiration in the last 6 months or so.  She has glaucoma and is blind in her left eye  She has trouble with anxiety/depression and she is on treatment for that  She is anemic without definite evidence of bleeding  She has hypertension at baseline Principal Problem:   Sepsis due to undetermined organism [facility]) Active Problems:   Hypertension   Glaucoma   Depression with anxiety   Anemia   Hyperlipidemia   Hypomagnesemia   Peripheral neuropathy   Pressure injury of skin    Plan: Continue treatments with antibiotics BiPAP as needed.  I think it would be helpful to get palliative medicine consultation.    LOS: 2 days   Claborn Janusz L 08/10/2017, 7:43 AM

## 2017-08-11 DIAGNOSIS — Z7189 Other specified counseling: Secondary | ICD-10-CM

## 2017-08-11 LAB — CBC WITH DIFFERENTIAL/PLATELET
BASOS PCT: 0 %
Basophils Absolute: 0 10*3/uL (ref 0.0–0.1)
EOS ABS: 0 10*3/uL (ref 0.0–0.7)
EOS PCT: 0 %
HCT: 28.1 % — ABNORMAL LOW (ref 36.0–46.0)
HEMOGLOBIN: 9 g/dL — AB (ref 12.0–15.0)
LYMPHS ABS: 1.6 10*3/uL (ref 0.7–4.0)
Lymphocytes Relative: 18 %
MCH: 28.2 pg (ref 26.0–34.0)
MCHC: 32 g/dL (ref 30.0–36.0)
MCV: 88.1 fL (ref 78.0–100.0)
MONO ABS: 0.5 10*3/uL (ref 0.1–1.0)
MONOS PCT: 6 %
Neutro Abs: 7.1 10*3/uL (ref 1.7–7.7)
Neutrophils Relative %: 76 %
PLATELETS: 281 10*3/uL (ref 150–400)
RBC: 3.19 MIL/uL — ABNORMAL LOW (ref 3.87–5.11)
RDW: 21.2 % — ABNORMAL HIGH (ref 11.5–15.5)
WBC: 9.3 10*3/uL (ref 4.0–10.5)

## 2017-08-11 LAB — RENAL FUNCTION PANEL
ALBUMIN: 2.3 g/dL — AB (ref 3.5–5.0)
ANION GAP: 8 (ref 5–15)
BUN: 17 mg/dL (ref 6–20)
CALCIUM: 7.7 mg/dL — AB (ref 8.9–10.3)
CO2: 26 mmol/L (ref 22–32)
Chloride: 107 mmol/L (ref 101–111)
Creatinine, Ser: 0.97 mg/dL (ref 0.44–1.00)
GFR calc Af Amer: 56 mL/min — ABNORMAL LOW (ref >60)
GFR calc non Af Amer: 48 mL/min — ABNORMAL LOW (ref >60)
Glucose, Bld: 82 mg/dL (ref 65–99)
PHOSPHORUS: 2.1 mg/dL — AB (ref 2.5–4.6)
Potassium: 3.2 mmol/L — ABNORMAL LOW (ref 3.5–5.1)
SODIUM: 141 mmol/L (ref 135–145)

## 2017-08-11 MED ORDER — MORPHINE SULFATE (CONCENTRATE) 10 MG/0.5ML PO SOLN
2.5000 mg | ORAL | Status: DC | PRN
  Administered 2017-08-11 – 2017-08-13 (×5): 5 mg via ORAL
  Filled 2017-08-11 (×5): qty 0.5

## 2017-08-11 MED ORDER — IPRATROPIUM-ALBUTEROL 0.5-2.5 (3) MG/3ML IN SOLN
3.0000 mL | Freq: Three times a day (TID) | RESPIRATORY_TRACT | Status: DC
  Administered 2017-08-12 – 2017-08-14 (×9): 3 mL via RESPIRATORY_TRACT
  Filled 2017-08-11 (×10): qty 3

## 2017-08-11 MED ORDER — ALBUTEROL SULFATE (2.5 MG/3ML) 0.083% IN NEBU
INHALATION_SOLUTION | RESPIRATORY_TRACT | Status: AC
  Administered 2017-08-11: 2.5 mg
  Filled 2017-08-11: qty 3

## 2017-08-11 MED ORDER — ALBUTEROL SULFATE (2.5 MG/3ML) 0.083% IN NEBU: 2.5000 mg | INHALATION_SOLUTION | RESPIRATORY_TRACT | Status: DC | PRN

## 2017-08-11 NOTE — Progress Notes (Signed)
Palliative: Call from ICU nursing staff.  Arrived on unit to evaluate Carla Bonilla.  She is breathing 40+ times per minute, does not interact with me in any meaningful way, is unable to fully close her eyes.  I believe that she may be actively dying.  Nursing staff and respiratory therapy also agree. Call to niece/HC POA, Rayetta Pigg.  I share that there have been some changes in Mrs. Mayall's condition.  Bonita Quin states that after our conversation she called Tammy and was able to speak with Mrs. Ehinger on the phone.  I share that things can change quickly, we think this is related to Mrs. Purdy's inability to maintain her secretions, cough up phlegm. I asked Bonita Quin if she would prefer that Tammy evaluate Mrs. Reznik.  She states she would, and that she will call Tammy to come look at Mrs. Prohaska. I asked Bonita Quin if it would be acceptable to her if we provide Mrs. Kallman with morphine in order to ease her breathing.  Bonita Quin is very reluctant, and states at this point, her preference is for Tammy to evaluate Mrs. Heinlen.  Bonita Quin asks if Babette Relic thinks that Mrs. Hinchcliff, needs the morphine is that okay for Tammy to let staff know.  I share that I will let nursing staff know their preference and we will continue to care for Carla Bonilla, as we are allowed. I again share my worry that time is short for Mrs. Rosendahl. Linda immediately calls back stating that she has spoken with Tammy.  She endorses the use of morphine for Mrs. Hoes.  She states that Tammy will return to the ICU shortly.  Nursing staff updated. 25 minutes Lillia Carmel, NP Palliative Medicine Team Team Phone # [phone]

## 2017-08-11 NOTE — Progress Notes (Signed)
Patient only going to be placed on BIPAP if needed. Patient is resting well on 6 lpm HFNC sats 100%.

## 2017-08-11 NOTE — Progress Notes (Signed)
Pharmacy Antibiotic Note  Carla Bonilla is a 84 y.o. female admitted on 08/08/2017 with sepsis.  Pharmacy has been consulted for Vancomycin and zosyn dosing.  Plan: Continue Zosyn 3.375g IV q8h (4 hour infusion). F/U cxs and clinical progress Monitor v/s, labs, and levels as indicated  Height:  (160 cm) Weight: 118 lb 13.3 oz (53.9 kg) IBW/kg (Calculated) : 52.4  Temp (24hrs), Avg:98.1 F (36.7 C), Min:97.7 F (36.5 C), Max:98.7 F (37.1 C)  Recent Labs  Lab 08/08/17 1751 08/08/17 1759 08/09/17 0409 08/10/17 0507 08/11/17 0428  WBC 16.1*  --  17.6* 13.2* 9.3  CREATININE 1.21*  --  1.17* 1.00 0.97  LATICACIDVEN  --  1.82  --   --   --     Estimated Creatinine Clearance: 29.3 mL/min (by C-G formula based on SCr of 0.97 mg/dL).    No Known Allergies  Antimicrobials this admission: Vancomycin 5/20 >>  Zosyn 5/20 >>5/22  Dose adjustments this admission: n/a  Microbiology results: 5/20 BCx: ngtd 5/20 UCx: pending  MRSA PCR:   Thank you for allowing pharmacy to be a part of this patient's care.  Judeth Cornfield, PharmD Clinical Pharmacist 08/11/2017 2:05 PM

## 2017-08-11 NOTE — Progress Notes (Signed)
  Speech Language Pathology Treatment: Dysphagia  Patient Details Name: LIS SAVITT MRN: [id number] DOB: 02-14-1921 Today's Date: 08/11/2017 Time: 7829-5621 SLP Time Calculation (min) (ACUTE ONLY): 16 min  Assessment / Plan / Recommendation Clinical Impression  Pt inappropriate for po trials at this time. She is now off bi-PAP and was resting quietly in bed. SLP spoke with caregiver, Tammy at bedside. Tammy is hoping that Pt can make a turn around, however also acknowledges that Pt is 89 and considers that she could be nearing death. She has been in contact with the Pt's niece and states that she knows that Pt would like to be at home for her death when it is time. She states that Mrs. Heldman did very well at [employer] last month and did well at home for about 5 days when she vomited after using the toilet. Mrs. Keniston enjoys sitting on her porch at home and Babette Relic expresses hope in bringing her back there if that is appropriate. Pt began to stir and SLP engaged with Pt, however Pt appeared anxious and with audible breathing and increasing RR. Pt is not appropriate for safe po trials at this time, however pending goals of care, consider comfort feeds as Pt desires of D2 and thin liquids. Pt is at increased risk for aspiration due to poor respiratory status and suspect element of esophageal dysphagia (given h/o emesis the past two admissions). SLP will follow per goals of care, which are not clearly defined at this time.   HPI HPI: OMA MARZAN a 84 y.o.femalewith medical history significant ofleft eye blindness, history of CVA, glaucoma, history of hearing loss, hyperlipidemia, hypertension, peripheral neuropathy who is coming to the emergency department due to fever.Per caregivers, the patient had a good breakfast and was at her baseline. Around 1300, the patient was having lunch and started vomiting and having a fever. Her mentation also decreased. Her caregivers had her rest,  but the patient started vomiting again around 1600, so she was brought into the emergency department. EMS described that her initial O2 sat was 78%. Family mentions that she has a chronic cough. Pt was febrile and elevated WBC count. She is known to this SLP from previous admissions. She has aphasia from a previous stroke. BSE requested. She had MBSS completed during admission last month with recommendation for D3 and thin liquids, however the MBSS was limited due to Pt unable/unwilling to fully participate.       SLP Plan  Continue with current plan of care       Recommendations  Diet recommendations: NPO(NPO vs comfort feeds of D2 and thin) Liquids provided via: Teaspoon Medication Administration: Crushed with puree Supervision: Full supervision/cueing for compensatory strategies;Staff to assist with self feeding Compensations: Small sips/bites;Slow rate                SLP Visit Diagnosis: Dysphagia, unspecified (R13.10) Plan: Continue with current plan of care       Thank you,  Havery Moros, CCC-SLP [phone]                 Ziv Welchel 08/11/2017, 1:49 PM

## 2017-08-11 NOTE — Progress Notes (Signed)
Daily Progress Note   Patient Name: Carla Bonilla       Date: 08/11/2017 DOB: 10-06-1920  Age: 84 y.o. MRN#: [id number] Attending Physician: Kari Baars, MD Primary Care Physician: Kari Baars, MD Admit Date: 08/08/2017  Reason for Consultation/Follow-up: Establishing goals of care and Psychosocial/spiritual support  Subjective: Carla Bonilla is resting quietly in bed.  She does not open her eyes or respond in any meaningful way when I call her name or squeeze her shoulder.  She is requiring the use of 10 L oxygen high flow nasal cannula.  Present today at bedside is her caregiver of approximately 6 months, Tammy Jawaid.  Tammy shares that Carla Bonilla had an episode of aspiration 30 to 40 days ago, went to rehab at [facility], and to return home in improved.  This is been approximately 1 week at home.  Tammy states that she has been texting Carla Bonilla's niece Bonita Quin.  She verifies with Bonita Quin that she is available for a phone conference.  I go to my office for private phone call with niece/HC POA, Rayetta Pigg at [phone].  Bonita Quin lives in [place].  We talked about Carla Bonilla's current health concern.  Bonita Quin shares the same story of aspiration then rehab.  She shares that Carla Bonilla was stronger than ever after her rehab, and this is "very surprising" to her that it "happened so quick".  I asked about conversations with medical providers.  Bonita Quin states that her last conversation was with nursing staff and physician related to emergent use of intubation.  I share with her the attendings notes from today.  Bonita Quin states that she knows Dr. Juanetta Gosling from her previous time in [place].  We talked about advanced directives.  Bonita Quin states that she could never get her aunt to commit  to a living will.  Bonita Quin states that she believes her aunt would not want a PEG tube.  I share that we do, in fact, have a copy of Mrs. Delellis's advanced directives completed April 2009 scanned into epic.  I share Mrs. Nesbitt's choices with [employer].  "If my condition is determined to be terminal and incurable I authorized the following: In addition to with holding or discontinuing extraordinary means if such means are necessary, my physician may withhold or discontinue either artificial nutrition or hydration, or both.".  We talked about Carla Bonilla's functional status.  Bonita Quin states that she was a wheelchair-bound, but last week she was able to assist with her own ADLs (improved after rehab).  Bonita Quin asks about medications that are sedating.  After review, I share that Carla Bonilla has not received any sedating medications since Tuesday evening around 11 PM.    I share my concern over Carla Bonilla's frail state, advanced age, recurrent aspiration.  Bonita Quin states that she needs to discuss choices with her brother, Levin Erp, who is listed as a secondary HC POA.  She states she also wants to discuss choices and care with others including neighbors and a caregiver since she is in [place].  We talked about how to make choices for loved ones including 1) keeping them at the center of decision-making (not what we want for them)  2) are we doing something to them, or for them, can we change what is happening (I share that we cannot change Carla Bonilla's stroke, dysarthria/dysphasia, recurrent aspiration) 3) what would the person Carla Bonilla was when she completed these advanced directives say about her current condition.  I encourage Bonita Quin to consider how we can best to continue to care for Carla Bonilla.  We talked about how we can continue to care for Carla Bonilla including the benefits of residential hospice.  We talked about comfort and dignity, medications for pain and suffering.  Bonita Quin  asks about cost of residential hospice, which is briefly shared.  I asked if she is interested in hearing about prognosis, as this may assist them in payment responsibilities.  She quickly says "no" to prognosis discussions.  Bonita Quin then states that she will contact me tomorrow related to her choices for her aunt.  I share with Bonita Quin that I need to verify how we are to continue to care for Carla Bonilla before we disconnect.  Bonita Quin is agreeable to continue the use of BiPAP.  She requests no pain or anxiety medications for her, as they "stay in her system".   Bonita Quin is concerned that these medications may be a reason she is not waking up.  I verify that Carla Bonilla has not had any sedating medications since Tuesday night around 11 PM.  I share with Bonita Quin the chronic illness pathway concept, that Carla Bonilla, likely, will not return to her previous level of function.  We discussed if/when her heart naturally stops, are we to press on her chest to try and restart her heart.  At first [place] states that "yes, we had agreed to try CPR, but do not shock her".  I shared my concern over the frailty of Carla Bonilla's body, and the medical team's recommendation that we not attempt resuscitation.  Bonita Quin accepts full DNR.  Conference with nursing staff related to plan of care. Consultation with Dr. Juanetta Gosling related to plan of care.  Length of Stay: 3  Current Medications: Scheduled Meds:  . enoxaparin (LOVENOX) injection  30 mg Subcutaneous Q24H  . fluticasone  1 spray Each Nare Daily  . gabapentin  100 mg Oral TID  . gatifloxacin  1 drop Left Eye QID  . ipratropium-albuterol  3 mL Nebulization Q6H  . losartan  100 mg Oral Daily  . mouth rinse  15 mL Mouth Rinse BID  . metoprolol tartrate  2.5 mg Intravenous Q6H  . sertraline  50 mg Oral q morning - 10a  . timolol  1 drop Right Eye QHS    Continuous Infusions: . sodium chloride 50  mL/hr at 08/10/17 2100  . piperacillin-tazobactam (ZOSYN)  IV  Stopped (08/11/17 1020)    PRN Meds: acetaminophen **OR** acetaminophen, furosemide, ondansetron **OR** ondansetron (ZOFRAN) IV, polyethylene glycol  Physical Exam  Constitutional: No distress.  Appears acutely/chronically ill, nonresponsive at this time  HENT:  Head: Atraumatic.  Cardiovascular: Normal rate.  Pulmonary/Chest: Effort normal. No respiratory distress.  Abdominal: Soft. She exhibits no distension.  Neurological:  Does not open eyes to voice or touch  Skin: Skin is warm and dry.  Nursing note and vitals reviewed.           Vital Signs: BP (!) 152/59   Pulse 67   Temp 98.2 F (36.8 C) (Axillary)   Resp (!) 24   Ht  (1.6 m)   Wt 53.9 kg (118 lb 13.3 oz)   SpO2 99%   BMI 21.05 kg/m  SpO2: SpO2: 99 % O2 Device: O2 Device: High Flow Nasal Cannula O2 Flow Rate: O2 Flow Rate (L/min): 10 L/min  Intake/output summary:   Intake/Output Summary (Last 24 hours) at 08/11/2017 1050 Last data filed at 08/11/2017 0800 Gross per 24 hour  Intake 750 ml  Output 2300 ml  Net -1550 ml   LBM: Last BM Date: 08/10/17 Baseline Weight: Weight: 52.6 kg (116 lb) Most recent weight: Weight: 53.9 kg (118 lb 13.3 oz)       Palliative Assessment/Data:    Flowsheet Rows     Most Recent Value  Intake Tab  Referral Department  Hospitalist  Unit at Time of Referral  Intermediate Care Unit  Palliative Care Primary Diagnosis  Pulmonary  Date Notified  08/09/17  Palliative Care Type  New Palliative care  Reason for referral  Clarify Goals of Care  Date of Admission  08/08/17  Date first seen by Palliative Care  08/10/17  # of days Palliative referral response time  1 Day(s)  # of days IP prior to Palliative referral  1  Clinical Assessment  Palliative Performance Scale Score  20%  Pain Max last 24 hours  Not able to report  Pain Min Last 24 hours  Not able to report  Dyspnea Max Last 24 Hours  Not able to report  Dyspnea Min Last 24 hours  Not able to report    Psychosocial & Spiritual Assessment  Palliative Care Outcomes  Patient/Family meeting held?  Yes  Patient/Family wishes: Interventions discontinued/not started   Mechanical Ventilation      Patient Active Problem List   Diagnosis Date Noted  . Goals of care, counseling/discussion   . Palliative care by specialist   . Pressure injury of skin 08/09/2017  . Sepsis due to undetermined organism (HCC) 08/08/2017  . Depression with anxiety 08/08/2017  . Anemia 08/08/2017  . Hyperlipidemia 08/08/2017  . Hypomagnesemia 08/08/2017  . Peripheral neuropathy 08/08/2017  . Metabolic encephalopathy 06/28/2017  . CAP (community acquired pneumonia) 06/25/2017  . Candida infection of genital region 05/28/2015  . Cellulitis of right lower leg 05/24/2015  . Cellulitis 05/24/2015  . GI bleeding 06/27/2014  . Heme positive stool   . Acute blood loss anemia   . Syncope and collapse 06/23/2014  . Laceration 06/23/2014  . Syncope 10/07/2013  . Spinal stenosis of lumbar region with radiculopathy 01/06/2012  . Sciatica neuralgia 01/06/2012  . Proximal humerus fracture 03/30/2011  . S/P shoulder surgery 12/08/2010  . Fracture, shoulder 12/08/2010  . Fracture of humerus, distal, right, closed 11/24/2010  . H/O: stroke with residual effects 11/24/2010  . Hypertension 11/24/2010  .  Glaucoma 11/24/2010    Palliative Care Assessment & Plan   Patient Profile: 84 y.o. 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   Assessment: sepsis due to undetermined organism: Sepsis resolving, white blood cells normal, heart rate 67-1 03, blood pressure 140/57-150/60.  Aspiration pneumonia: Seems to be improving per pulmonology, continues to require 10 L high flow via nasal cannula, okay to continue use of BiPAP  per [facility] POA.   Recommendations/Plan:  24 hours for outcomes.  Now full DNR.  Continue conversations with niece/HC POA, Rayetta Pigg.  Goals of Care and Additional Recommendations:  Limitations on Scope of Treatment: Continue to treat the treatable but no CPR, no intubation.  Okay for BiPAP.  Niece request no mind altering medications/sedatives.  Code Status:    Code Status Orders  (From admission, onward)        Start     Ordered   08/09/17 2326  Limited resuscitation (code)  Continuous    Question Answer Comment  In the event of cardiac or respiratory ARREST: Initiate Code Blue, Call Rapid Response Yes   In the event of cardiac or respiratory ARREST: Perform CPR Yes   In the event of cardiac or respiratory ARREST: Perform Intubation/Mechanical Ventilation No   In the event of cardiac or respiratory ARREST: Use NIPPV/BiPAp only if indicated Yes   In the event of cardiac or respiratory ARREST: Administer ACLS medications if indicated Yes   In the event of cardiac or respiratory ARREST: Perform Defibrillation or Cardioversion if indicated No      08/09/17 2327    Code Status History    Date Active Date Inactive Code Status Order ID Comments User Context   08/08/2017 2127 08/09/2017 2327 Full Code [id number]  Bobette Mo, MD Inpatient   06/25/2017 2229 06/29/2017 1839 Full Code [id number]  Onnie Boer, MD ED   05/27/2015 0858 05/30/2015 2017 DNR [id number]  Kari Baars, MD Inpatient   05/24/2015 2308 05/27/2015 0858 Full Code [id number]  Houston Siren, MD Inpatient   06/23/2014 0606 06/27/2014 1742 Full Code [id number]  Houston Siren, MD ED   11/25/2010 1431 11/27/2010 1738 DNR [id number]  Noah Charon, RN Inpatient       Prognosis:   < 6 weeks, or less would not be surprising based on frailty, functional decline recurrent aspiration pneumonia, albumin 2.3.  Recent sepsis with pneumonia requiring 10 L high flow oxygen via nasal cannula/BiPAP.  Discharge Planning:  To be  determined, based on outcomes.  We discussed the benefits of residential hospice.  Care plan was discussed with nursing staff, social worker, Dr. Juanetta Gosling on next rounds.  Thank you for allowing the Palliative Medicine Team to assist in the care of this patient.   Time In: 1030 Time Out: 1150 Total Time 80 minutes Prolonged Time Billed  yes       Greater than 50%  of this time was spent counseling and coordinating care related to the above assessment and plan.  Katheran Awe, NP  Please contact Palliative Medicine Team phone at [phone] for questions and concerns.

## 2017-08-11 NOTE — Progress Notes (Signed)
Subjective: No substantial change.  She is on BiPAP.  She is sleeping but will open her eyes.  Objective: Vital signs in last 24 hours: Temp:  [97.5 F (36.4 C)-98.2 F (36.8 C)] 97.7 F (36.5 C) (05/23 0400) Pulse Rate:  [60-96] 67 (05/23 0800) Resp:  [17-29] 24 (05/23 0800) BP: (111-154)/(47-85) 152/59 (05/23 0800) SpO2:  [95 %-100 %] 99 % (05/23 0812) FiO2 (%):  [40 %] 40 % (05/23 0304) Weight:  [53.9 kg (118 lb 13.3 oz)] 53.9 kg (118 lb 13.3 oz) (05/23 0500) Weight change: -0.1 kg (-3.5 oz) Last BM Date: 08/10/17  Intake/Output from previous day: 05/22 0701 - 05/23 0700 In: 2140.8 [I.V.:2090.8; IV Piggyback:50] Out: 2300 [Urine:2300]  PHYSICAL EXAM General appearance: Sleepy but will open her eyes Resp: Diminished breath sounds with some rhonchi Cardio: regular rate and rhythm, S1, S2 normal, no murmur, click, rub or gallop GI: soft, non-tender; bowel sounds normal; no masses,  no organomegaly Extremities: extremities normal, atraumatic, no cyanosis or edema  Lab Results:  Results for orders placed or performed during the hospital encounter of 08/08/17 (from the past 48 hour(s))  CBC with Differential     Status: Abnormal   Collection Time: 08/10/17  5:07 AM  Result Value Ref Range   WBC 13.2 (H) 4.0 - 10.5 K/uL   RBC 3.48 (L) 3.87 - 5.11 MIL/uL   Hemoglobin 9.3 (L) 12.0 - 15.0 g/dL   HCT 30.6 (L) 36.0 - 46.0 %   MCV 87.9 78.0 - 100.0 fL   MCH 26.7 26.0 - 34.0 pg   MCHC 30.4 30.0 - 36.0 g/dL   RDW 21.2 (H) 11.5 - 15.5 %   Platelets 284 150 - 400 K/uL   Neutrophils Relative % 91 %   Neutro Abs 12.1 (H) 1.7 - 7.7 K/uL   Lymphocytes Relative 7 %   Lymphs Abs 0.9 0.7 - 4.0 K/uL   Monocytes Relative 2 %   Monocytes Absolute 0.2 0.1 - 1.0 K/uL   Eosinophils Relative 0 %   Eosinophils Absolute 0.0 0.0 - 0.7 K/uL   Basophils Relative 0 %   Basophils Absolute 0.0 0.0 - 0.1 K/uL    Comment: Performed at [facility], [address], [place], [place] [address]  Renal  function panel     Status: Abnormal   Collection Time: 08/10/17  5:07 AM  Result Value Ref Range   Sodium 140 135 - 145 mmol/L   Potassium 3.7 3.5 - 5.1 mmol/L   Chloride 107 101 - 111 mmol/L   CO2 25 22 - 32 mmol/L   Glucose, Bld 115 (H) 65 - 99 mg/dL   BUN 21 (H) 6 - 20 mg/dL   Creatinine, Ser 1.00 0.44 - 1.00 mg/dL   Calcium 7.8 (L) 8.9 - 10.3 mg/dL   Phosphorus 3.3 2.5 - 4.6 mg/dL   Albumin 2.4 (L) 3.5 - 5.0 g/dL   GFR calc non Af Amer 47 (L) >60 mL/min   GFR calc Af Amer 54 (L) >60 mL/min    Comment: (NOTE) The eGFR has been calculated using the CKD EPI equation. This calculation has not been validated in all clinical situations. eGFR's persistently <60 mL/min signify possible Chronic Kidney Disease.    Anion gap 8 5 - 15    Comment: Performed at [facility], [address] [place], [place] [address]  Type and screen [facility]     Status: None   Collection Time: 08/10/17  5:07 AM  Result Value Ref Range  ABO/RH(D) B POS    Antibody Screen NEG    Sample Expiration      08/13/2017 Performed at [facility], [address] [place], [place] [address]   CBC with Differential     Status: Abnormal   Collection Time: 08/11/17  4:28 AM  Result Value Ref Range   WBC 9.3 4.0 - 10.5 K/uL   RBC 3.19 (L) 3.87 - 5.11 MIL/uL   Hemoglobin 9.0 (L) 12.0 - 15.0 g/dL   HCT 28.1 (L) 36.0 - 46.0 %   MCV 88.1 78.0 - 100.0 fL   MCH 28.2 26.0 - 34.0 pg   MCHC 32.0 30.0 - 36.0 g/dL   RDW 21.2 (H) 11.5 - 15.5 %   Platelets 281 150 - 400 K/uL   Neutrophils Relative % 76 %   Neutro Abs 7.1 1.7 - 7.7 K/uL   Lymphocytes Relative 18 %   Lymphs Abs 1.6 0.7 - 4.0 K/uL   Monocytes Relative 6 %   Monocytes Absolute 0.5 0.1 - 1.0 K/uL   Eosinophils Relative 0 %   Eosinophils Absolute 0.0 0.0 - 0.7 K/uL   Basophils Relative 0 %   Basophils Absolute 0.0 0.0 - 0.1 K/uL    Comment: Performed at [facility], [address], [place], [place] [address]  Renal function panel     Status:  Abnormal   Collection Time: 08/11/17  4:28 AM  Result Value Ref Range   Sodium 141 135 - 145 mmol/L   Potassium 3.2 (L) 3.5 - 5.1 mmol/L   Chloride 107 101 - 111 mmol/L   CO2 26 22 - 32 mmol/L   Glucose, Bld 82 65 - 99 mg/dL   BUN 17 6 - 20 mg/dL   Creatinine, Ser 0.97 0.44 - 1.00 mg/dL   Calcium 7.7 (L) 8.9 - 10.3 mg/dL   Phosphorus 2.1 (L) 2.5 - 4.6 mg/dL   Albumin 2.3 (L) 3.5 - 5.0 g/dL   GFR calc non Af Amer 48 (L) >60 mL/min   GFR calc Af Amer 56 (L) >60 mL/min    Comment: (NOTE) The eGFR has been calculated using the CKD EPI equation. This calculation has not been validated in all clinical situations. eGFR's persistently <60 mL/min signify possible Chronic Kidney Disease.    Anion gap 8 5 - 15    Comment: Performed at [facility], [address] [place], [place] [address]    ABGS No results for input(s): PHART, PO2ART, TCO2, HCO3 in the last 72 hours.  Invalid input(s): PCO2 CULTURES Recent Results (from the past 240 hour(s))  Culture, blood (Routine x 2)     Status: None (Preliminary result)   Collection Time: 08/08/17  5:52 PM  Result Value Ref Range Status   Specimen Description BLOOD LEFT WRIST  Final   Special Requests   Final    BOTTLES DRAWN AEROBIC AND ANAEROBIC Blood Culture results may not be optimal due to an inadequate volume of blood received in culture bottles   Culture   Final    NO GROWTH 3 DAYS Performed at [facility], [address], [place], [place] [address]    Report Status PENDING  Incomplete  Culture, blood (Routine x 2)     Status: None (Preliminary result)   Collection Time: 08/08/17  5:52 PM  Result Value Ref Range Status   Specimen Description BLOOD RIGHT ARM  Final   Special Requests   Final    BOTTLES DRAWN AEROBIC AND ANAEROBIC Blood Culture adequate volume  Culture   Final    NO GROWTH 3 DAYS Performed at [facility], [address], [place], [place] [address]    Report Status PENDING  Incomplete  MRSA PCR Screening      Status: None   Collection Time: 08/08/17  9:25 PM  Result Value Ref Range Status   MRSA by PCR NEGATIVE NEGATIVE Final    Comment:        The GeneXpert MRSA Assay (FDA approved for NASAL specimens only), is one component of a comprehensive MRSA colonization surveillance program. It is not intended to diagnose MRSA infection nor to guide or monitor treatment for MRSA infections. Performed at [facility], [address], [place], [place] [address]    Studies/Results: Dg Chest Port 1 View  Result Date: 08/09/2017 CLINICAL DATA:  Shortness of breath with agitation EXAM: PORTABLE CHEST 1 VIEW COMPARISON:  08/08/2017, 06/25/2017 FINDINGS: Surgical plate and multiple screw fixation of the right humerus for old fracture deformity. Stable cardiomediastinal silhouette with vascular congestion. Mild diffuse interstitial opacity consistent with background pulmonary edema. Aortic atherosclerosis. Increasing airspace disease at the left lung base. No pneumothorax. IMPRESSION: 1. Increasing airspace disease at the left lung base, suspect for pneumonia 2. Vascular congestion with mild pulmonary edema. Electronically Signed   By: Donavan Foil M.D.   On: 08/09/2017 23:44    Medications:  Prior to Admission:  Medications Prior to Admission  Medication Sig Dispense Refill Last Dose  . albuterol (PROVENTIL HFA;VENTOLIN HFA) 108 (90 Base) MCG/ACT inhaler Inhale 1-2 puffs into the lungs every 6 (six) hours as needed for wheezing or shortness of breath.   unknown  . amLODipine (NORVASC) 5 MG tablet Take 5 mg by mouth daily.   08/08/2017 at Unknown time  . furosemide (LASIX) 40 MG tablet Take 40 mg by mouth daily as needed for fluid.    08/07/2017 at Unknown time  . gabapentin (NEURONTIN) 100 MG capsule Take 1 capsule (100 mg total) by mouth 3 (three) times daily. 90 capsule 5 08/08/2017 at 1300  . losartan (COZAAR) 100 MG tablet Take 100 mg by mouth daily.   08/08/2017 at Unknown time  . metoprolol tartrate  (LOPRESSOR) 25 MG tablet Take 25 mg by mouth 2 (two) times daily.     08/08/2017 at 820a  . moxifloxacin (VIGAMOX) 0.5 % ophthalmic solution Place 1 drop into the left eye 2 (two) times daily.    08/08/2017 at Unknown time  . sertraline (ZOLOFT) 50 MG tablet Take 50 mg by mouth every morning.   08/08/2017 at Unknown time  . timolol (TIMOPTIC) 0.25 % ophthalmic solution Place 1 drop into the right eye at bedtime.   08/07/2017 at Unknown time  . fluticasone (FLONASE) 50 MCG/ACT nasal spray Place 1 spray into both nostrils daily.  2 unknown  . ipratropium-albuterol (DUONEB) 0.5-2.5 (3) MG/3ML SOLN Take 3 mLs by nebulization 2 (two) times daily. (Patient not taking: Reported on 08/08/2017) 360 mL  Not Taking at Unknown time  . ondansetron (ZOFRAN) 4 MG tablet Take 1 tablet (4 mg total) by mouth every 6 (six) hours as needed for nausea. 20 tablet 0 unknown  . polyethylene glycol (MIRALAX / GLYCOLAX) packet Take 17 g by mouth daily as needed for mild constipation. 14 each 0 unknown   Scheduled: . enoxaparin (LOVENOX) injection  30 mg Subcutaneous Q24H  . fluticasone  1 spray Each Nare Daily  . gabapentin  100 mg Oral TID  . gatifloxacin  1 drop Left Eye QID  .  ipratropium-albuterol  3 mL Nebulization Q6H  . losartan  100 mg Oral Daily  . mouth rinse  15 mL Mouth Rinse BID  . metoprolol tartrate  2.5 mg Intravenous Q6H  . sertraline  50 mg Oral q morning - 10a  . timolol  1 drop Right Eye QHS   Continuous: . sodium chloride 50 mL/hr at 08/10/17 2100  . piperacillin-tazobactam (ZOSYN)  IV 3.375 g (08/11/17 9656)   LPX:[id number] **OR** acetaminophen, furosemide, ondansetron **OR** ondansetron (ZOFRAN) IV, polyethylene glycol  Assesment: She was admitted with sepsis from aspiration pneumonia.  She likely has chronic aspiration from her previous stroke.  She is on BiPAP now and has had more respiratory distress but I think that is a little bit better.  She is no longer septic.  She has hypertension  which is stable.  Her pneumonia seems to be improving  She has severe dysarthria and severe dysphagia from her stroke.  She had echocardiogram yesterday which shows preserved left ventricular function Principal Problem:   Sepsis due to undetermined organism [facility]) Active Problems:   Hypertension   Glaucoma   Depression with anxiety   Anemia   Hyperlipidemia   Hypomagnesemia   Peripheral neuropathy   Pressure injury of skin   Goals of care, counseling/discussion   Palliative care by specialist    Plan: Continue current treatments    LOS: 3 days   Mahamadou Weltz L 08/11/2017, 8:29 AM

## 2017-08-12 DIAGNOSIS — R0603 Acute respiratory distress: Secondary | ICD-10-CM

## 2017-08-12 DIAGNOSIS — R0600 Dyspnea, unspecified: Secondary | ICD-10-CM

## 2017-08-12 LAB — RENAL FUNCTION PANEL
ALBUMIN: 2.2 g/dL — AB (ref 3.5–5.0)
ANION GAP: 9 (ref 5–15)
BUN: 14 mg/dL (ref 6–20)
CO2: 25 mmol/L (ref 22–32)
Calcium: 7.6 mg/dL — ABNORMAL LOW (ref 8.9–10.3)
Chloride: 107 mmol/L (ref 101–111)
Creatinine, Ser: 0.9 mg/dL (ref 0.44–1.00)
GFR, EST NON AFRICAN AMERICAN: 53 mL/min — AB (ref >60)
GLUCOSE: 64 mg/dL — AB (ref 65–99)
PHOSPHORUS: 2.1 mg/dL — AB (ref 2.5–4.6)
Potassium: 3 mmol/L — ABNORMAL LOW (ref 3.5–5.1)
SODIUM: 141 mmol/L (ref 135–145)

## 2017-08-12 NOTE — Progress Notes (Signed)
Spoke briefly with caregiver and RN. Pt has been very lethargic, unable to take meds by mouth, difficulty managing secretions per RN. Pt currently resting peacefully; plan to check one more time this early evening for increased alertness. RN/ caregiver in agreement.  Thana Ates, MA, CCC-SLP

## 2017-08-12 NOTE — Progress Notes (Signed)
Palliative: Mrs. Marchetti is resting quietly in bed.  She looks much more calm and comfortable this morning.  She will not try to interact with me in any meaningful way.  She will not open her eyes except to relatively hard sternal rub.  She does not make eye contact.  There is no family at bedside at this time. Conference with nursing staff related to plan of care/symptom management.  Call to niece/HC POA, Rayetta Pigg. Update her on Mrs. Leavens's condition, the use of morphine by mouth 3 times, and her subsequent improvement in breathing/comfort.  We talked about the indications for the use of morphine.  All questions answered, no concerns noted. 35 minutes Lillia Carmel, NP Palliative Medicine Team Team Phone # [phone]

## 2017-08-12 NOTE — Progress Notes (Signed)
  Speech Language Pathology Treatment: Dysphagia  Patient Details Name: Carla Bonilla MRN: [id number] DOB: 1920-08-15 Today's Date: 08/12/2017 Time: 4098-1191 SLP Time Calculation (min) (ACUTE ONLY): 18 min  Assessment / Plan / Recommendation Clinical Impression  Dysphagia treatment provided for PO trials. Pt with waxing/ waning alertness but did maintain alertness in about 10-second intervals to allow for trials of ice chips. Prior to PO intake pt's breathing was very congested; suspect not managing secretions well. Attempted to cue pt to cough but was unable to follow commands. She did respond to some yes/ no questions and stated some unintelligible utterances. Pt appeared to tolerate ice chips without overt s/s of aspiration but continued to sound congested; given this and pt's lethargy, it was not appropriate to attempt further PO trials at this time and unfortunately cannot recommend a safe diet. Recommend continued conversation with family to determine whether to proceed with PO diet with known aspiration risk. SLP will continue to follow for continued education/ PO trials as appropriate. Agree with previous SLP that dysphagia 2 diet/ thin liquids would be most appropriate if PO diet is pursued, providing oral care before PO intake. If NPO status continues, recommend providing ice chips after oral care for comfort.   HPI HPI: Carla Bonilla a 84 y.o.femalewith medical history significant ofleft eye blindness, history of CVA, glaucoma, history of hearing loss, hyperlipidemia, hypertension, peripheral neuropathy who is coming to the emergency department due to fever.Per caregivers, the patient had a good breakfast and was at her baseline. Around 1300, the patient was having lunch and started vomiting and having a fever. Her mentation also decreased. Her caregivers had her rest, but the patient started vomiting again around 1600, so she was brought into the emergency department. EMS  described that her initial O2 sat was 78%. Family mentions that she has a chronic cough. Pt was febrile and elevated WBC count. She is known to this SLP from previous admissions. She has aphasia from a previous stroke. BSE requested. She had MBSS completed during admission last month with recommendation for D3 and thin liquids, however the MBSS was limited due to Pt unable/unwilling to fully participate.       SLP Plan  Continue with current plan of care       Recommendations  Diet recommendations: Other(comment)(NPO vs comfort feeds) Liquids provided via: Teaspoon Medication Administration: Crushed with puree Supervision: Full supervision/cueing for compensatory strategies Compensations: Slow rate;Small sips/bites Postural Changes and/or Swallow Maneuvers: Seated upright 90 degrees                Oral Care Recommendations: Oral care QID;Oral care prior to ice chip/H20 SLP Visit Diagnosis: Dysphagia, unspecified (R13.10) Plan: Continue with current plan of care       GO                Metro Kung, MA, CCC-SLP 08/12/2017, 6:52 PM

## 2017-08-12 NOTE — Progress Notes (Signed)
Patient taken off BIPAP and placed back on 6 lpm HFNC.

## 2017-08-12 NOTE — Progress Notes (Signed)
Subjective: This morning she is awake and alert.  Smiles and laughs.  However she had a severe episode yesterday and I think she probably aspirated again.  Objective: Vital signs in last 24 hours: Temp:  [98.1 F (36.7 C)-100 F (37.8 C)] 98.4 F (36.9 C) (05/24 0727) Pulse Rate:  [67-116] 83 (05/24 0727) Resp:  [20-40] 23 (05/24 0727) BP: (123-152)/(56-107) 129/59 (05/24 0600) SpO2:  [86 %-100 %] 100 % (05/24 0727) FiO2 (%):  [40 %-90 %] 40 % (05/23 2352) Weight:  [53.9 kg (118 lb 13.3 oz)] 53.9 kg (118 lb 13.3 oz) (05/24 0500) Weight change: 0 kg (0 lb) Last BM Date: 08/10/17  Intake/Output from previous day: 05/23 0701 - 05/24 0700 In: 1050 [I.V.:950; IV Piggyback:100] Out: 1150 [Urine:1150]  PHYSICAL EXAM General appearance: alert Resp: rhonchi bilaterally Cardio: regular rate and rhythm, S1, S2 normal, no murmur, click, rub or gallop GI: soft, non-tender; bowel sounds normal; no masses,  no organomegaly Extremities: extremities normal, atraumatic, no cyanosis or edema  Lab Results:  Results for orders placed or performed during the hospital encounter of 08/08/17 (from the past 48 hour(s))  CBC with Differential     Status: Abnormal   Collection Time: 08/11/17  4:28 AM  Result Value Ref Range   WBC 9.3 4.0 - 10.5 K/uL   RBC 3.19 (L) 3.87 - 5.11 MIL/uL   Hemoglobin 9.0 (L) 12.0 - 15.0 g/dL   HCT 28.1 (L) 36.0 - 46.0 %   MCV 88.1 78.0 - 100.0 fL   MCH 28.2 26.0 - 34.0 pg   MCHC 32.0 30.0 - 36.0 g/dL   RDW 21.2 (H) 11.5 - 15.5 %   Platelets 281 150 - 400 K/uL   Neutrophils Relative % 76 %   Neutro Abs 7.1 1.7 - 7.7 K/uL   Lymphocytes Relative 18 %   Lymphs Abs 1.6 0.7 - 4.0 K/uL   Monocytes Relative 6 %   Monocytes Absolute 0.5 0.1 - 1.0 K/uL   Eosinophils Relative 0 %   Eosinophils Absolute 0.0 0.0 - 0.7 K/uL   Basophils Relative 0 %   Basophils Absolute 0.0 0.0 - 0.1 K/uL    Comment: Performed at [facility], [address] [place], [place] [address]  Renal  function panel     Status: Abnormal   Collection Time: 08/11/17  4:28 AM  Result Value Ref Range   Sodium 141 135 - 145 mmol/L   Potassium 3.2 (L) 3.5 - 5.1 mmol/L   Chloride 107 101 - 111 mmol/L   CO2 26 22 - 32 mmol/L   Glucose, Bld 82 65 - 99 mg/dL   BUN 17 6 - 20 mg/dL   Creatinine, Ser 0.97 0.44 - 1.00 mg/dL   Calcium 7.7 (L) 8.9 - 10.3 mg/dL   Phosphorus 2.1 (L) 2.5 - 4.6 mg/dL   Albumin 2.3 (L) 3.5 - 5.0 g/dL   GFR calc non Af Amer 48 (L) >60 mL/min   GFR calc Af Amer 56 (L) >60 mL/min    Comment: (NOTE) The eGFR has been calculated using the CKD EPI equation. This calculation has not been validated in all clinical situations. eGFR's persistently <60 mL/min signify possible Chronic Kidney Disease.    Anion gap 8 5 - 15    Comment: Performed at [facility], [address], [place], [place] [address]  Renal function panel     Status: Abnormal   Collection Time: 08/12/17  3:46 AM  Result Value Ref Range   Sodium 141  135 - 145 mmol/L   Potassium 3.0 (L) 3.5 - 5.1 mmol/L   Chloride 107 101 - 111 mmol/L   CO2 25 22 - 32 mmol/L   Glucose, Bld 64 (L) 65 - 99 mg/dL   BUN 14 6 - 20 mg/dL   Creatinine, Ser 0.90 0.44 - 1.00 mg/dL   Calcium 7.6 (L) 8.9 - 10.3 mg/dL   Phosphorus 2.1 (L) 2.5 - 4.6 mg/dL   Albumin 2.2 (L) 3.5 - 5.0 g/dL   GFR calc non Af Amer 53 (L) >60 mL/min   GFR calc Af Amer >60 >60 mL/min    Comment: (NOTE) The eGFR has been calculated using the CKD EPI equation. This calculation has not been validated in all clinical situations. eGFR's persistently <60 mL/min signify possible Chronic Kidney Disease.    Anion gap 9 5 - 15    Comment: Performed at [facility], [address], [place], [place] [address]    ABGS No results for input(s): PHART, PO2ART, TCO2, HCO3 in the last 72 hours.  Invalid input(s): PCO2 CULTURES Recent Results (from the past 240 hour(s))  Culture, blood (Routine x 2)     Status: None (Preliminary result)   Collection Time: 08/08/17   5:52 PM  Result Value Ref Range Status   Specimen Description BLOOD LEFT WRIST  Final   Special Requests   Final    BOTTLES DRAWN AEROBIC AND ANAEROBIC Blood Culture results may not be optimal due to an inadequate volume of blood received in culture bottles   Culture   Final    NO GROWTH 4 DAYS Performed at [facility], [address], [place], [place] [address]    Report Status PENDING  Incomplete  Culture, blood (Routine x 2)     Status: None (Preliminary result)   Collection Time: 08/08/17  5:52 PM  Result Value Ref Range Status   Specimen Description BLOOD RIGHT ARM  Final   Special Requests   Final    BOTTLES DRAWN AEROBIC AND ANAEROBIC Blood Culture adequate volume   Culture   Final    NO GROWTH 4 DAYS Performed at [facility], [address] [place], [place] [address]    Report Status PENDING  Incomplete  MRSA PCR Screening     Status: None   Collection Time: 08/08/17  9:25 PM  Result Value Ref Range Status   MRSA by PCR NEGATIVE NEGATIVE Final    Comment:        The GeneXpert MRSA Assay (FDA approved for NASAL specimens only), is one component of a comprehensive MRSA colonization surveillance program. It is not intended to diagnose MRSA infection nor to guide or monitor treatment for MRSA infections. Performed at [facility], [address] [place], [place] [address]    Studies/Results: No results found.  Medications:  Prior to Admission:  Medications Prior to Admission  Medication Sig Dispense Refill Last Dose  . albuterol (PROVENTIL HFA;VENTOLIN HFA) 108 (90 Base) MCG/ACT inhaler Inhale 1-2 puffs into the lungs every 6 (six) hours as needed for wheezing or shortness of breath.   unknown  . amLODipine (NORVASC) 5 MG tablet Take 5 mg by mouth daily.   08/08/2017 at Unknown time  . furosemide (LASIX) 40 MG tablet Take 40 mg by mouth daily as needed for fluid.    08/07/2017 at Unknown time  . gabapentin (NEURONTIN) 100 MG capsule Take 1 capsule (100 mg total) by  mouth 3 (three) times daily. 90 capsule 5 08/08/2017 at 1300  .  losartan (COZAAR) 100 MG tablet Take 100 mg by mouth daily.   08/08/2017 at Unknown time  . metoprolol tartrate (LOPRESSOR) 25 MG tablet Take 25 mg by mouth 2 (two) times daily.     08/08/2017 at 820a  . moxifloxacin (VIGAMOX) 0.5 % ophthalmic solution Place 1 drop into the left eye 2 (two) times daily.    08/08/2017 at Unknown time  . sertraline (ZOLOFT) 50 MG tablet Take 50 mg by mouth every morning.   08/08/2017 at Unknown time  . timolol (TIMOPTIC) 0.25 % ophthalmic solution Place 1 drop into the right eye at bedtime.   08/07/2017 at Unknown time  . fluticasone (FLONASE) 50 MCG/ACT nasal spray Place 1 spray into both nostrils daily.  2 unknown  . ipratropium-albuterol (DUONEB) 0.5-2.5 (3) MG/3ML SOLN Take 3 mLs by nebulization 2 (two) times daily. (Patient not taking: Reported on 08/08/2017) 360 mL  Not Taking at Unknown time  . ondansetron (ZOFRAN) 4 MG tablet Take 1 tablet (4 mg total) by mouth every 6 (six) hours as needed for nausea. 20 tablet 0 unknown  . polyethylene glycol (MIRALAX / GLYCOLAX) packet Take 17 g by mouth daily as needed for mild constipation. 14 each 0 unknown   Scheduled: . enoxaparin (LOVENOX) injection  30 mg Subcutaneous Q24H  . fluticasone  1 spray Each Nare Daily  . gabapentin  100 mg Oral TID  . gatifloxacin  1 drop Left Eye QID  . ipratropium-albuterol  3 mL Nebulization TID  . losartan  100 mg Oral Daily  . mouth rinse  15 mL Mouth Rinse BID  . metoprolol tartrate  2.5 mg Intravenous Q6H  . sertraline  50 mg Oral q morning - 10a  . timolol  1 drop Right Eye QHS   Continuous: . sodium chloride 50 mL/hr at 08/12/17 0746  . piperacillin-tazobactam (ZOSYN)  IV 3.375 g (08/12/17 0602)   RWE:[id number] **OR** acetaminophen, albuterol, furosemide, morphine CONCENTRATE, ondansetron **OR** ondansetron (ZOFRAN) IV, polyethylene glycol  Assesment: She was admitted with sepsis related to aspiration  pneumonia.  She is no longer septic.  She had another episode of what seems to have been aspiration yesterday and got extremely sick but looks better this morning.  She has had at least 2 strokes and that has left her with dysarthria and dysphagia.  She has been requiring BiPAP but at this point is on high flow nasal cannula and looks okay Principal Problem:   Sepsis due to undetermined organism [facility]) Active Problems:   Hypertension   Glaucoma   Depression with anxiety   Anemia   Hyperlipidemia   Hypomagnesemia   Peripheral neuropathy   Pressure injury of skin   Goals of care, counseling/discussion   Palliative care by specialist   DNR (do not resuscitate) discussion    Plan: Continue current treatments.  Discussed with caretaker Tammy at bedside.  She understands that recurrent aspiration will continue to be a risk.    LOS: 4 days   Carla Bonilla L 08/12/2017, 7:46 AM

## 2017-08-12 NOTE — Care Management Important Message (Signed)
Important Message  Patient Details  Name: Carla Bonilla MRN: [id number] Date of Birth: 1921/03/16   Medicare Important Message Given:  Yes    Renie Ora 08/12/2017, 12:11 PM

## 2017-08-13 LAB — CULTURE, BLOOD (ROUTINE X 2)
CULTURE: NO GROWTH
CULTURE: NO GROWTH
Special Requests: ADEQUATE

## 2017-08-13 LAB — RENAL FUNCTION PANEL
ANION GAP: 12 (ref 5–15)
Albumin: 2.2 g/dL — ABNORMAL LOW (ref 3.5–5.0)
BUN: 15 mg/dL (ref 6–20)
CALCIUM: 7.9 mg/dL — AB (ref 8.9–10.3)
CO2: 22 mmol/L (ref 22–32)
CREATININE: 0.91 mg/dL (ref 0.44–1.00)
Chloride: 109 mmol/L (ref 101–111)
GFR, EST NON AFRICAN AMERICAN: 52 mL/min — AB (ref >60)
Glucose, Bld: 50 mg/dL — ABNORMAL LOW (ref 65–99)
Phosphorus: 1.7 mg/dL — ABNORMAL LOW (ref 2.5–4.6)
Potassium: 3 mmol/L — ABNORMAL LOW (ref 3.5–5.1)
SODIUM: 143 mmol/L (ref 135–145)

## 2017-08-13 MED ORDER — FUROSEMIDE 10 MG/ML IJ SOLN
40.0000 mg | Freq: Once | INTRAMUSCULAR | Status: AC
  Administered 2017-08-13: 40 mg via INTRAVENOUS
  Filled 2017-08-13: qty 4

## 2017-08-13 MED ORDER — POTASSIUM CHLORIDE 10 MEQ/100ML IV SOLN
10.0000 meq | INTRAVENOUS | Status: AC
  Administered 2017-08-13 (×4): 10 meq via INTRAVENOUS
  Filled 2017-08-13 (×4): qty 100

## 2017-08-13 MED ORDER — MORPHINE SULFATE (PF) 2 MG/ML IV SOLN
2.0000 mg | INTRAVENOUS | Status: DC | PRN
  Administered 2017-08-13 – 2017-08-15 (×7): 2 mg via INTRAVENOUS
  Filled 2017-08-13 (×8): qty 1

## 2017-08-13 NOTE — Progress Notes (Signed)
Patient noted moaning and crying out. Unable to maintain saliva, requiring frequent suctioning at times. Pt has been lethargic, sleepy throughout the shift with periods of crying out in pain. Patient on morphine concentrate PO but noted it dripping out of her mouth and coughing d/t sleepiness. MD notified and ordered given to change PO morphine to IV push Morphine Q2H.

## 2017-08-13 NOTE — Progress Notes (Signed)
Pt sounding very congested and upon assessment of lungs, this RN heard crackles, wheezing and rhonchi. MD paged to receive PRN Lasix dose IV. Pt not able to swallow PO dose of lasix at this moment. 1X dose of   Lasix  IV given.  Will continue to monitor  Genelle Bal, RN

## 2017-08-13 NOTE — Progress Notes (Signed)
Patient alert with palliative care consult currently DNR on Zosyn status post multiple CVAs with aspiration likewise with potassium 3.0 this will be repleted with 4 runs of KCl will check be met in daily x3.  2D echo reveals normal systolic function Carla Bonilla:[id number] DOB: 10/17/1920 DOA: 08/08/2017 PCP: Sinda Du, MD   Physical Exam: Blood pressure (!) 140/59, pulse 86, temperature 98.2 F (36.8 C), temperature source Oral, resp. rate (!) 22, height _0  (1.6 Bonilla), weight 53 kg (116 lb 13.5 oz), SpO2 99 %.  Lungs coarse rhonchi bilaterally mild bibasilar crepitations and expiratory wheeze noted.  Heart regular rhythm no S3-S4 no heaves thrills or rubs abdomen soft nontender bowel sounds normoactive   Investigations:  Recent Results (from the past 240 hour(s))  Culture, blood (Routine x 2)     Status: None   Collection Time: 08/08/17  5:52 PM  Result Value Ref Range Status   Specimen Description BLOOD LEFT WRIST  Final   Special Requests   Final    BOTTLES DRAWN AEROBIC AND ANAEROBIC Blood Culture results may not be optimal due to an inadequate volume of blood received in culture bottles   Culture   Final    NO GROWTH 5 DAYS Performed at [facility], [address], [place], [place] [address]    Report Status 08/13/2017 FINAL  Final  Culture, blood (Routine x 2)     Status: None   Collection Time: 08/08/17  5:52 PM  Result Value Ref Range Status   Specimen Description BLOOD RIGHT ARM  Final   Special Requests   Final    BOTTLES DRAWN AEROBIC AND ANAEROBIC Blood Culture adequate volume   Culture   Final    NO GROWTH 5 DAYS Performed at [facility], [address] [place], [place] [address]    Report Status 08/13/2017 FINAL  Final  MRSA PCR Screening     Status: None   Collection Time: 08/08/17  9:25 PM  Result Value Ref Range Status   MRSA by PCR NEGATIVE NEGATIVE Final    Comment:        The GeneXpert MRSA Assay (FDA approved for NASAL specimens only), is  one component of a comprehensive MRSA colonization surveillance program. It is not intended to diagnose MRSA infection nor to guide or monitor treatment for MRSA infections. Performed at [facility], [address], [place], [place] [address]      Basic Metabolic Panel: Recent Labs    08/12/17 0346 08/13/17 0412  NA 141 143  K 3.0* 3.0*  CL 107 109  CO2 25 22  GLUCOSE 64* 50*  BUN 14 15  CREATININE 0.90 0.91  CALCIUM 7.6* 7.9*  PHOS 2.1* 1.7*   Liver Function Tests: Recent Labs    08/12/17 0346 08/13/17 0412  ALBUMIN 2.2* 2.2*     CBC: Recent Labs    08/11/17 0428  WBC 9.3  NEUTROABS 7.1  HGB 9.0*  HCT 28.1*  MCV 88.1  PLT 281    No results found.    Medications:   Impression:  Principal Problem:   Sepsis due to undetermined organism [facility]) Active Problems:   Hypertension   Glaucoma   Depression with anxiety   Anemia   Hyperlipidemia   Hypomagnesemia   Peripheral neuropathy   Pressure injury of skin   Goals of care, counseling/discussion   Palliative care by specialist   DNR (do not resuscitate) discussion   Dyspnea     Plan: Continue Zosyn as ordered.  Check be met daily x3 replenish potassium with 40 mEq IV CBC on Sunday  Consultants: Palliative care   Procedures   Antibiotics: Zosyn IV         Time spent: 30 minutes   LOS: 5 days   Carla Bonilla   08/13/2017, 9:38 AM

## 2017-08-14 LAB — RENAL FUNCTION PANEL
ANION GAP: 13 (ref 5–15)
Albumin: 2.2 g/dL — ABNORMAL LOW (ref 3.5–5.0)
BUN: 13 mg/dL (ref 6–20)
CO2: 25 mmol/L (ref 22–32)
Calcium: 8 mg/dL — ABNORMAL LOW (ref 8.9–10.3)
Chloride: 104 mmol/L (ref 101–111)
Creatinine, Ser: 0.99 mg/dL (ref 0.44–1.00)
GFR calc non Af Amer: 47 mL/min — ABNORMAL LOW (ref >60)
GFR, EST AFRICAN AMERICAN: 55 mL/min — AB (ref >60)
Glucose, Bld: 52 mg/dL — ABNORMAL LOW (ref 65–99)
Phosphorus: 1.8 mg/dL — ABNORMAL LOW (ref 2.5–4.6)
Potassium: 3.2 mmol/L — ABNORMAL LOW (ref 3.5–5.1)
Sodium: 142 mmol/L (ref 135–145)

## 2017-08-14 LAB — BASIC METABOLIC PANEL
ANION GAP: 12 (ref 5–15)
BUN: 13 mg/dL (ref 6–20)
CHLORIDE: 104 mmol/L (ref 101–111)
CO2: 25 mmol/L (ref 22–32)
Calcium: 8 mg/dL — ABNORMAL LOW (ref 8.9–10.3)
Creatinine, Ser: 0.96 mg/dL (ref 0.44–1.00)
GFR calc non Af Amer: 49 mL/min — ABNORMAL LOW (ref >60)
GFR, EST AFRICAN AMERICAN: 57 mL/min — AB (ref >60)
Glucose, Bld: 53 mg/dL — ABNORMAL LOW (ref 65–99)
Potassium: 3.1 mmol/L — ABNORMAL LOW (ref 3.5–5.1)
Sodium: 141 mmol/L (ref 135–145)

## 2017-08-14 LAB — GLUCOSE, CAPILLARY: Glucose-Capillary: 139 mg/dL — ABNORMAL HIGH (ref 65–99)

## 2017-08-14 MED ORDER — DEXTROSE-NACL 5-0.45 % IV SOLN
INTRAVENOUS | Status: DC
  Administered 2017-08-14 – 2017-08-15 (×2): via INTRAVENOUS

## 2017-08-14 MED ORDER — DEXTROSE 50 % IV SOLN: 25.0000 mL | Freq: Once | INTRAVENOUS | Status: AC

## 2017-08-14 MED ORDER — POTASSIUM CHLORIDE 10 MEQ/100ML IV SOLN
10.0000 meq | INTRAVENOUS | Status: AC
  Administered 2017-08-14 (×4): 10 meq via INTRAVENOUS
  Filled 2017-08-14 (×2): qty 100

## 2017-08-14 MED ORDER — DEXTROSE 50 % IV SOLN
INTRAVENOUS | Status: AC
  Administered 2017-08-14: 25 mL
  Filled 2017-08-14: qty 50

## 2017-08-14 NOTE — Progress Notes (Signed)
Patient alert pushes me away upon auscultation is been n.p.o. for several days potassium still 3.2 despite IV replenishment yesterday we will add 4 runs of KCl today creatinine 0.9 nine 2D echo reveals systolic function 3419% we will change her fluids to D5 half-normal saline as she reported some hypoglycemia this morning from not eating.  Will attempt to have a ground-up diet dysphagia 3 and see if she will chew and/or swallow any of these she is off of most sedating medicines Carla Bonilla FXT:[id number] DOB: 02-16-1921 DOA: 08/08/2017 PCP: Sinda Du, MD   Physical Exam: Blood pressure (!) 146/58, pulse 63, temperature 97.8 F (36.6 C), temperature source Axillary, resp. rate 14, height _0  (1.6 m), weight 50.9 kg (112 lb 3.4 oz), SpO2 99 %.  Lungs coarse rhonchi bilaterally mild end expiratory wheeze no rales audible heart regular rhythm no S3-S4 no heaves thrills rubs abdomen soft nontender bowel sounds normoactive   Investigations:  Recent Results (from the past 240 hour(s))  Culture, blood (Routine x 2)     Status: None   Collection Time: 08/08/17  5:52 PM  Result Value Ref Range Status   Specimen Description BLOOD LEFT WRIST  Final   Special Requests   Final    BOTTLES DRAWN AEROBIC AND ANAEROBIC Blood Culture results may not be optimal due to an inadequate volume of blood received in culture bottles   Culture   Final    NO GROWTH 5 DAYS Performed at [facility], [address], [place], [place] [address]    Report Status 08/13/2017 FINAL  Final  Culture, blood (Routine x 2)     Status: None   Collection Time: 08/08/17  5:52 PM  Result Value Ref Range Status   Specimen Description BLOOD RIGHT ARM  Final   Special Requests   Final    BOTTLES DRAWN AEROBIC AND ANAEROBIC Blood Culture adequate volume   Culture   Final    NO GROWTH 5 DAYS Performed at [facility], [address], [place], [place] [address]    Report Status 08/13/2017 FINAL  Final  MRSA PCR Screening      Status: None   Collection Time: 08/08/17  9:25 PM  Result Value Ref Range Status   MRSA by PCR NEGATIVE NEGATIVE Final    Comment:        The GeneXpert MRSA Assay (FDA approved for NASAL specimens only), is one component of a comprehensive MRSA colonization surveillance program. It is not intended to diagnose MRSA infection nor to guide or monitor treatment for MRSA infections. Performed at [facility], [address], [place], [place] [address]      Basic Metabolic Panel: Recent Labs    08/13/17 0412 08/14/17 0443  NA 143 142  141  K 3.0* 3.2*  3.1*  CL 109 104  104  CO2 _1 GLUCOSE 50* 52*  53*  BUN _2 CREATININE 0.91 0.99  0.96  CALCIUM 7.9* 8.0*  8.0*  PHOS 1.7* 1.8*   Liver Function Tests: Recent Labs    08/13/17 0412 08/14/17 0443  ALBUMIN 2.2* 2.2*     CBC: No results for input(s): WBC, NEUTROABS, HGB, HCT, MCV, PLT in the last 72 hours.  No results found.    Medications:  Impression:  Principal Problem:   Sepsis due to undetermined organism [facility]) Active Problems:   Hypertension   Glaucoma   Depression with anxiety   Anemia   Hyperlipidemia   Hypomagnesemia  Peripheral neuropathy   Pressure injury of skin   Goals of care, counseling/discussion   Palliative care by specialist   DNR (do not resuscitate) discussion   Dyspnea     Plan: Attempt ground-up dysphagia 3 diet to see if she will eat chew and swallow?  KCl 10 mEq IV x4.  Change IV fluids to D5 half-normal at 75 cc an hour.  Consider clonidine TTS 2 patch if she becomes hypertensive.  Be met daily x3  Consultants:    Procedures   Antibiotics: Zosyn IV          Time spent: 30 minutes   LOS: 6 days   Sosha Shepherd M   08/14/2017, 11:45 AM

## 2017-08-14 NOTE — Progress Notes (Signed)
RN Maureen Ralphs administering 25mL of D50 based off of morning lab glucose levels. Will recheck CBG in 15 mins.  Genelle Bal, RN

## 2017-08-14 NOTE — Progress Notes (Signed)
Attempted to feed patient.  Given small amount of liquid x2 which she let run out of her mouth.  Offered soft food.  She would not open her mouth to allow food to be given.  Feeding attempt unsuccessful.

## 2017-08-14 NOTE — Progress Notes (Signed)
Pharmacy Antibiotic Note  Carla Bonilla is a 84 y.o. female admitted on 08/08/2017 with sepsis.  Pharmacy has been consulted for  Zosyn dosing.  Plan: Continue Zosyn 3.375g IV q8h; consider de-escalating since BCx2 show NG (final)  F/U cxs and clinical progress Monitor v/s, labs, and levels as indicated  Height:  (160 cm) Weight: 112 lb 3.4 oz (50.9 kg) IBW/kg (Calculated) : 52.4  Temp (24hrs), Avg:98.2 F (36.8 C), Min:97.8 F (36.6 C), Max:98.5 F (36.9 C)  Recent Labs  Lab 08/08/17 1751 08/08/17 1759 08/09/17 0409 08/10/17 0507 08/11/17 0428 08/12/17 0346 08/13/17 0412 08/14/17 0443  WBC 16.1*  --  17.6* 13.2* 9.3  --   --   --   CREATININE 1.21*  --  1.17* 1.00 0.97 0.90 0.91 0.99  0.96  LATICACIDVEN  --  1.82  --   --   --   --   --   --     Estimated Creatinine Clearance: 27.9 mL/min (by C-G formula based on SCr of 0.99 mg/dL).    No Known Allergies  Antimicrobials this admission: Vancomycin 5/20 >> 5/20 Zosyn 5/20 >>  Dose adjustments this admission: n/a  Microbiology results: 5/20 BCx2: ng (final) 5/20 MRSA PCR: negative  Thank you for allowing pharmacy to be a part of this patient's care.  [place], [facility] 08/14/2017 1:12 PM

## 2017-08-14 NOTE — Progress Notes (Signed)
Pt's glucose on morning labs have been low for the past few days. Pt may benefit from D5 in maintenance fluids  Genelle Bal, RN

## 2017-08-15 DIAGNOSIS — Z7189 Other specified counseling: Secondary | ICD-10-CM

## 2017-08-15 LAB — BASIC METABOLIC PANEL
Anion gap: 9 (ref 5–15)
BUN: 8 mg/dL (ref 6–20)
CHLORIDE: 101 mmol/L (ref 101–111)
CO2: 29 mmol/L (ref 22–32)
Calcium: 8 mg/dL — ABNORMAL LOW (ref 8.9–10.3)
Creatinine, Ser: 0.89 mg/dL (ref 0.44–1.00)
GFR, EST NON AFRICAN AMERICAN: 54 mL/min — AB (ref >60)
Glucose, Bld: 118 mg/dL — ABNORMAL HIGH (ref 65–99)
POTASSIUM: 3.2 mmol/L — AB (ref 3.5–5.1)
SODIUM: 139 mmol/L (ref 135–145)

## 2017-08-15 LAB — CBC WITH DIFFERENTIAL/PLATELET
BASOS ABS: 0 10*3/uL (ref 0.0–0.1)
Basophils Relative: 0 %
EOS PCT: 3 %
Eosinophils Absolute: 0.2 10*3/uL (ref 0.0–0.7)
HCT: 29.8 % — ABNORMAL LOW (ref 36.0–46.0)
Hemoglobin: 9.3 g/dL — ABNORMAL LOW (ref 12.0–15.0)
LYMPHS ABS: 1.8 10*3/uL (ref 0.7–4.0)
Lymphocytes Relative: 28 %
MCH: 27 pg (ref 26.0–34.0)
MCHC: 31.2 g/dL (ref 30.0–36.0)
MCV: 86.6 fL (ref 78.0–100.0)
Monocytes Absolute: 0.5 10*3/uL (ref 0.1–1.0)
Monocytes Relative: 8 %
NEUTROS PCT: 61 %
Neutro Abs: 3.9 10*3/uL (ref 1.7–7.7)
Platelets: 274 10*3/uL (ref 150–400)
RBC: 3.44 MIL/uL — AB (ref 3.87–5.11)
RDW: 20.8 % — AB (ref 11.5–15.5)
WBC: 6.4 10*3/uL (ref 4.0–10.5)

## 2017-08-15 LAB — RENAL FUNCTION PANEL
ALBUMIN: 2.2 g/dL — AB (ref 3.5–5.0)
ANION GAP: 7 (ref 5–15)
BUN: 8 mg/dL (ref 6–20)
CO2: 31 mmol/L (ref 22–32)
Calcium: 8.1 mg/dL — ABNORMAL LOW (ref 8.9–10.3)
Chloride: 101 mmol/L (ref 101–111)
Creatinine, Ser: 0.89 mg/dL (ref 0.44–1.00)
GFR, EST NON AFRICAN AMERICAN: 54 mL/min — AB (ref >60)
Glucose, Bld: 118 mg/dL — ABNORMAL HIGH (ref 65–99)
PHOSPHORUS: 1.4 mg/dL — AB (ref 2.5–4.6)
POTASSIUM: 3.2 mmol/L — AB (ref 3.5–5.1)
Sodium: 139 mmol/L (ref 135–145)

## 2017-08-15 MED ORDER — FUROSEMIDE 20 MG PO TABS: 20.0000 mg | ORAL_TABLET | Freq: Every day | ORAL | Status: DC | PRN

## 2017-08-15 MED ORDER — BISACODYL 10 MG RE SUPP: 10.0000 mg | Freq: Every day | RECTAL | Status: DC | PRN

## 2017-08-15 MED ORDER — POTASSIUM CHLORIDE 10 MEQ/100ML IV SOLN
10.0000 meq | INTRAVENOUS | Status: DC
  Administered 2017-08-15 (×2): 10 meq via INTRAVENOUS
  Filled 2017-08-15 (×3): qty 100

## 2017-08-15 MED ORDER — ATROPINE SULFATE 1 % OP SOLN: 2.0000 [drp] | Freq: Three times a day (TID) | OPHTHALMIC | Status: DC | PRN

## 2017-08-15 MED ORDER — LORAZEPAM 2 MG/ML IJ SOLN
0.5000 mg | Freq: Four times a day (QID) | INTRAMUSCULAR | Status: DC | PRN
  Administered 2017-08-15: 0.5 mg via INTRAVENOUS
  Administered 2017-08-16: 2 mg via INTRAVENOUS
  Filled 2017-08-15 (×3): qty 1

## 2017-08-15 NOTE — Progress Notes (Signed)
Nutrition Brief Note  Chart reviewed. Pt transitioning to comfort care and is being referred to residential Hospice..  No further nutrition interventions warranted at this time.  Please re-consult as needed.   Royann Shivers MS,RD,CSG,LDN Office: [phone] Pager: [phone]

## 2017-08-15 NOTE — Progress Notes (Signed)
Palliative: Carla Bonilla is lying in bed crying and yelling out.  Nursing staff is at bedside attempting to provide care.  Present today at bedside is caregiver of 6 months, Carla Bonilla 89.  We talked about Carla Bonilla's current condition, her poor by mouth intake.  Carla Bonilla and I review Carla Bonilla's living will found in epic.  All questions answered.  Carla Bonilla states she will contact Carla Bonilla's niece/HC POA, Carla Bonilla. I return later in the morning requested by Carla Bonilla.  Carla Bonilla states that she has spoken with Carla Bonilla who requested that Carla Bonilla make the choice on direction of care.  Carla Bonilla states that she has spoken with Carla Bonilla who told her, "I love you, but let me go".  Carla Bonilla is agreeable to residential hospice.  I share that someone will have to sign paperwork for Carla Bonilla.  Carla Bonilla states that usually she signs with verbal consent from Carla Bonilla and [place]. Conference with nursing staff related to plan of care and disposition. 1030-1140= 70 minutes Lillia Carmel, NP Palliative Medicine Team Team Phone # [phone]

## 2017-08-15 NOTE — Care Management Important Message (Signed)
Important Message  Patient Details  Name: Carla Bonilla MRN: [id number] Date of Birth: 11/25/1920   Medicare Important Message Given:  Yes    Renie Ora 08/15/2017, 11:21 AM

## 2017-08-15 NOTE — Progress Notes (Signed)
Dysphagia 3 diet ordered yesterday she refuses to eat and/or swallow any food.  Discussed with caretaker in room how patient is dwindling and not responding to any medicines electrolytes intravenous or foods she seems to understand this will transfer patient to regular floor continue palliative care. Carla Bonilla ZOX:[id number] DOB: 29-Dec-1920 DOA: 08/08/2017 PCP: Kari Baars, MD   Physical Exam: Blood pressure (!) 117/47, pulse 67, temperature 97.9 F (36.6 C), temperature source Axillary, resp. rate 15, height  (1.6 m), weight 51.4 kg (113 lb 5.1 oz), SpO2 97 %.  Lungs coarse rhonchi bilaterally mild end expiratory wheeze noted no rales audible heart regular rhythm no S3-S4 no heaves thrills rubs   Investigations:  Recent Results (from the past 240 hour(s))  Culture, blood (Routine x 2)     Status: None   Collection Time: 08/08/17  5:52 PM  Result Value Ref Range Status   Specimen Description BLOOD LEFT WRIST  Final   Special Requests   Final    BOTTLES DRAWN AEROBIC AND ANAEROBIC Blood Culture results may not be optimal due to an inadequate volume of blood received in culture bottles   Culture   Final    NO GROWTH 5 DAYS Performed at [facility], [address], [place], [place] [address]    Report Status 08/13/2017 FINAL  Final  Culture, blood (Routine x 2)     Status: None   Collection Time: 08/08/17  5:52 PM  Result Value Ref Range Status   Specimen Description BLOOD RIGHT ARM  Final   Special Requests   Final    BOTTLES DRAWN AEROBIC AND ANAEROBIC Blood Culture adequate volume   Culture   Final    NO GROWTH 5 DAYS Performed at [facility], [address], [place], [place] [address]    Report Status 08/13/2017 FINAL  Final  MRSA PCR Screening     Status: None   Collection Time: 08/08/17  9:25 PM  Result Value Ref Range Status   MRSA by PCR NEGATIVE NEGATIVE Final    Comment:        The GeneXpert MRSA Assay (FDA approved for NASAL specimens only), is one  component of a comprehensive MRSA colonization surveillance program. It is not intended to diagnose MRSA infection nor to guide or monitor treatment for MRSA infections. Performed at [facility], [address], [place], [place] [address]      Basic Metabolic Panel: Recent Labs    08/14/17 0443 08/15/17 0431  NA 142  141 139  139  K 3.2*  3.1* 3.2*  3.2*  CL 104  104 101  101  CO2 GLUCOSE 52*  53* 118*  118*  BUN CREATININE 0.99  0.96 0.89  0.89  CALCIUM 8.0*  8.0* 8.1*  8.0*  PHOS 1.8* 1.4*   Liver Function Tests: Recent Labs    08/14/17 0443 08/15/17 0431  ALBUMIN 2.2* 2.2*     CBC: Recent Labs    08/15/17 0914  WBC 6.4  NEUTROABS 3.9  HGB 9.3*  HCT 29.8*  MCV 86.6  PLT 274    No results found.    Medications:   Impression:  Principal Problem:   Sepsis due to undetermined organism [facility]) Active Problems:   Hypertension   Glaucoma   Depression with anxiety   Anemia   Hyperlipidemia   Hypomagnesemia   Peripheral neuropathy   Pressure injury of skin   Goals  of care, counseling/discussion   Palliative care by specialist   DNR (do not resuscitate) discussion   Dyspnea     Plan: KCl 10 mEq IV x4 today.  Transfer to telemetry regular floor  Consultants:    Procedures   Antibiotics: Zosyn IV          Time spent: 30 minutes   LOS: 7 days   Kit Brubacher M   08/15/2017, 11:27 AM

## 2017-08-16 DIAGNOSIS — F418 Other specified anxiety disorders: Secondary | ICD-10-CM

## 2017-08-16 NOTE — Clinical Social Work Note (Signed)
Patient referred to CSW for [facility] referral by palliative care. Referral made to [facility].    Jahfari Ambers, Juleen China, LCSW

## 2017-08-16 NOTE — Progress Notes (Signed)
IV removed, 2x2 gauze and paper tape applied to site, patient tolerated well. Report called to Bonita Quin, LPN at [facility].  [facility] called patient awaiting transport to facility.

## 2017-08-16 NOTE — Progress Notes (Signed)
Subjective: She is overall about the same.  She does look up and smile at me when I call her name  Objective: Vital signs in last 24 hours: Temp:  [97.9 F (36.6 C)-98.2 F (36.8 C)] 97.9 F (36.6 C) (05/28 0453) Pulse Rate:  [67-122] 122 (05/28 0453) Resp:  [15-18] 16 (05/28 0453) BP: (117-141)/(47-112) 141/112 (05/28 0453) SpO2:  [97 %] 97 % (05/27 2142) Weight change:  Last BM Date: (unknown)  Intake/Output from previous day: 05/27 0701 - 05/28 0700 In: 76.5 [I.V.:76.5] Out: 1750 [Urine:1750]  PHYSICAL EXAM General appearance: She does open her eyes and look at me Resp: rhonchi bilaterally Cardio: regular rate and rhythm, S1, S2 normal, no murmur, click, rub or gallop GI: soft, non-tender; bowel sounds normal; no masses,  no organomegaly Extremities: extremities normal, atraumatic, no cyanosis or edema  Lab Results:  Results for orders placed or performed during the hospital encounter of 08/08/17 (from the past 48 hour(s))  Renal function panel     Status: Abnormal   Collection Time: 08/15/17  4:31 AM  Result Value Ref Range   Sodium 139 135 - 145 mmol/L   Potassium 3.2 (L) 3.5 - 5.1 mmol/L   Chloride 101 101 - 111 mmol/L   CO2 31 22 - 32 mmol/L   Glucose, Bld 118 (H) 65 - 99 mg/dL   BUN 8 6 - 20 mg/dL   Creatinine, Ser 0.89 0.44 - 1.00 mg/dL   Calcium 8.1 (L) 8.9 - 10.3 mg/dL   Phosphorus 1.4 (L) 2.5 - 4.6 mg/dL   Albumin 2.2 (L) 3.5 - 5.0 g/dL   GFR calc non Af Amer 54 (L) >60 mL/min   GFR calc Af Amer >60 >60 mL/min    Comment: (NOTE) The eGFR has been calculated using the CKD EPI equation. This calculation has not been validated in all clinical situations. eGFR's persistently <60 mL/min signify possible Chronic Kidney Disease.    Anion gap 7 5 - 15    Comment: Performed at [facility], [address] [place], [place] [address]  Basic metabolic panel     Status: Abnormal   Collection Time: 08/15/17  4:31 AM  Result Value Ref Range   Sodium 139 135 - 145  mmol/L   Potassium 3.2 (L) 3.5 - 5.1 mmol/L   Chloride 101 101 - 111 mmol/L   CO2 29 22 - 32 mmol/L   Glucose, Bld 118 (H) 65 - 99 mg/dL   BUN 8 6 - 20 mg/dL   Creatinine, Ser 0.89 0.44 - 1.00 mg/dL   Calcium 8.0 (L) 8.9 - 10.3 mg/dL   GFR calc non Af Amer 54 (L) >60 mL/min   GFR calc Af Amer >60 >60 mL/min    Comment: (NOTE) The eGFR has been calculated using the CKD EPI equation. This calculation has not been validated in all clinical situations. eGFR's persistently <60 mL/min signify possible Chronic Kidney Disease.    Anion gap 9 5 - 15    Comment: Performed at [facility], [address] [place], [place] [address]  CBC with Differential/Platelet     Status: Abnormal   Collection Time: 08/15/17  9:14 AM  Result Value Ref Range   WBC 6.4 4.0 - 10.5 K/uL   RBC 3.44 (L) 3.87 - 5.11 MIL/uL   Hemoglobin 9.3 (L) 12.0 - 15.0 g/dL   HCT 29.8 (L) 36.0 - 46.0 %   MCV 86.6 78.0 - 100.0 fL   MCH 27.0 26.0 - 34.0 pg   MCHC  31.2 30.0 - 36.0 g/dL   RDW 20.8 (H) 11.5 - 15.5 %   Platelets 274 150 - 400 K/uL   Neutrophils Relative % 61 %   Lymphocytes Relative 28 %   Monocytes Relative 8 %   Eosinophils Relative 3 %   Basophils Relative 0 %   Neutro Abs 3.9 1.7 - 7.7 K/uL   Lymphs Abs 1.8 0.7 - 4.0 K/uL   Monocytes Absolute 0.5 0.1 - 1.0 K/uL   Eosinophils Absolute 0.2 0.0 - 0.7 K/uL   Basophils Absolute 0.0 0.0 - 0.1 K/uL   WBC Morphology VACUOLATED NEUTROPHILS     Comment: FEW BANDS FEW ATYPICAL LYMPHOCYTES Performed at [facility] [address] [place], [place] [address]     ABGS No results for input(s): PHART, PO2ART, TCO2, HCO3 in the last 72 hours.  Invalid input(s): PCO2 CULTURES Recent Results (from the past 240 hour(s))  Culture, blood (Routine x 2)     Status: None   Collection Time: 08/08/17  5:52 PM  Result Value Ref Range Status   Specimen Description BLOOD LEFT WRIST  Final   Special Requests   Final    BOTTLES DRAWN AEROBIC AND ANAEROBIC Blood Culture  results may not be optimal due to an inadequate volume of blood received in culture bottles   Culture   Final    NO GROWTH 5 DAYS Performed at [facility], [address] [place], [place] [address]    Report Status 08/13/2017 FINAL  Final  Culture, blood (Routine x 2)     Status: None   Collection Time: 08/08/17  5:52 PM  Result Value Ref Range Status   Specimen Description BLOOD RIGHT ARM  Final   Special Requests   Final    BOTTLES DRAWN AEROBIC AND ANAEROBIC Blood Culture adequate volume   Culture   Final    NO GROWTH 5 DAYS Performed at [facility], [address] [place], [place] [address]    Report Status 08/13/2017 FINAL  Final  MRSA PCR Screening     Status: None   Collection Time: 08/08/17  9:25 PM  Result Value Ref Range Status   MRSA by PCR NEGATIVE NEGATIVE Final    Comment:        The GeneXpert MRSA Assay (FDA approved for NASAL specimens only), is one component of a comprehensive MRSA colonization surveillance program. It is not intended to diagnose MRSA infection nor to guide or monitor treatment for MRSA infections. Performed at [facility], [address], [place], [place] [address]    Studies/Results: No results found.  Medications:  Prior to Admission:  Medications Prior to Admission  Medication Sig Dispense Refill Last Dose  . albuterol (PROVENTIL HFA;VENTOLIN HFA) 108 (90 Base) MCG/ACT inhaler Inhale 1-2 puffs into the lungs every 6 (six) hours as needed for wheezing or shortness of breath.   unknown  . amLODipine (NORVASC) 5 MG tablet Take 5 mg by mouth daily.   08/08/2017 at Unknown time  . furosemide (LASIX) 40 MG tablet Take 40 mg by mouth daily as needed for fluid.    08/07/2017 at Unknown time  . gabapentin (NEURONTIN) 100 MG capsule Take 1 capsule (100 mg total) by mouth 3 (three) times daily. 90 capsule 5 08/08/2017 at 1300  . losartan (COZAAR) 100 MG tablet Take 100 mg by mouth daily.   08/08/2017 at Unknown time  . metoprolol tartrate  (LOPRESSOR) 25 MG tablet Take 25 mg by mouth 2 (two) times daily.  08/08/2017 at 820a  . moxifloxacin (VIGAMOX) 0.5 % ophthalmic solution Place 1 drop into the left eye 2 (two) times daily.    08/08/2017 at Unknown time  . sertraline (ZOLOFT) 50 MG tablet Take 50 mg by mouth every morning.   08/08/2017 at Unknown time  . timolol (TIMOPTIC) 0.25 % ophthalmic solution Place 1 drop into the right eye at bedtime.   08/07/2017 at Unknown time  . fluticasone (FLONASE) 50 MCG/ACT nasal spray Place 1 spray into both nostrils daily.  2 unknown  . ipratropium-albuterol (DUONEB) 0.5-2.5 (3) MG/3ML SOLN Take 3 mLs by nebulization 2 (two) times daily. (Patient not taking: Reported on 08/08/2017) 360 mL  Not Taking at Unknown time  . ondansetron (ZOFRAN) 4 MG tablet Take 1 tablet (4 mg total) by mouth every 6 (six) hours as needed for nausea. 20 tablet 0 unknown  . polyethylene glycol (MIRALAX / GLYCOLAX) packet Take 17 g by mouth daily as needed for mild constipation. 14 each 0 unknown   Scheduled: . mouth rinse  15 mL Mouth Rinse BID   Continuous: . sodium chloride 10 mL/hr at 08/15/17 1421   HTV:[id number] **OR** acetaminophen, albuterol, atropine, bisacodyl, LORazepam, morphine injection, ondansetron **OR** ondansetron (ZOFRAN) IV  Assesment: She was admitted with sepsis from aspiration pneumonia.  She chronically aspirates.  It looks like she is going to go to the hospice inpatient facility which I think is appropriate Principal Problem:   Sepsis due to undetermined organism [facility]) Active Problems:   Hypertension   Glaucoma   Depression with anxiety   Anemia   Hyperlipidemia   Hypomagnesemia   Peripheral neuropathy   Pressure injury of skin   Goals of care, counseling/discussion   Palliative care by specialist   DNR (do not resuscitate) discussion   Dyspnea   Encounter for hospice care discussion    Plan: Transfer to the inpatient facility when bed available    LOS: 8 days    Zeena Starkel L 08/16/2017, 8:46 AM

## 2017-08-16 NOTE — Clinical Social Work Note (Signed)
[facility] please.  First contact is caregiver Willaim Bane, [id number] Niece/HCPOA Rayetta Pigg in [place], Bonita Quin asks Tammy to make choices.

## 2017-08-16 NOTE — Progress Notes (Signed)
Palliative: Mrs. Selke is lying quietly in bed.  There is no family at bedside at this time.  I reached to adjust her down, and she starts to cry out.  She has unintelligible speech.  She will briefly make eye contact, but is unable to make her basic needs known. Conference with nursing staff related to symptom management/plan of care. Conference with social worker related to plan of care and disposition. 25 minutes Lillia Carmel, NP Palliative Medicine Team Team Phone # [phone]

## 2017-08-16 NOTE — Clinical Social Work Note (Signed)
Patient Information   Patient Name Carla Bonilla, Carla Bonilla ([id number]) Sex Female DOB [birth year] SSN [id number]   Room Bed  A304 A304-01  Patient Demographics   Address (Temporary) [address] [place] [address] Contact Numbers (Temporary) [phone]  Patient Ethnicity & Race   Ethnic Group Patient Race  Not Hispanic or Latino White or Caucasian  Emergency Contact(s)   Name Relation Home [place] Other   [phone]  Phillips,Linda Niece [phone]  [phone]  [place] Adie  [phone]  [phone]  Ihor Gully [phone]    Documents on File    Status Date Received Description  Documents for the Patient  Driver's License Not Received    Historic Radiology Documentation Not Received    [place] HIPAA NOTICE OF PRIVACY - Scanned Received 12/08/10   [place] E-Signature HIPAA Notice of Privacy Received 01/14/12   [place] E-Signature HIPAA Notice of Privacy Spanish Signed 06/25/17   Insurance Card Received 01/06/12   Advance Directives/Living Will/HCPOA/POA Not Received    Financial Application Not Received    Insurance Card Not Received    [place] HIPAA NOTICE OF PRIVACY - Scanned Not Received    Advance Directives/Living Will/HCPOA/POA Not Received    Advance Directives/Living Will/HCPOA/POA Not Received    [place] HIPAA NOTICE OF PRIVACY - Scanned Not Received    AMB Correspondence Not Received  09/12 Clarification [facility]  AMB Outside Consult Note Not Received  10/12 [facility]  AMB Correspondence Not Received  11/12 Referral Romeo Apple MD, S  Insurance Card Not Received    [place] E-Signature HIPAA Notice of Privacy Received 01/06/12   [place] HIPAA NOTICE OF PRIVACY - Scanned Received 01/19/12 Authorized contacts request form  [place] HIPAA NOTICE OF PRIVACY - Scanned Not Received    Other Photo ID Not Received    Insurance Card   aarp  [place] HIPAA NOTICE OF PRIVACY - Scanned Not Received     [place] E-Signature HIPAA Notice of Privacy     Driver's License Not Received    Insurance Card Not Received    Advance Directives/Living Will/HCPOA/POA Not Received    Other Photo ID Not Received    HIM ROI Authorization  06/30/17 [facility] [place]  [place] HIPAA NOTICE OF PRIVACY - Scanned Received (Deleted) 01/06/12   AMB Correspondence (Deleted) 12/10/10 09/12 OfficeNote Harrison,S MD  AMB Outside Consult Note (Deleted) 02/19/11 11/12 [facility]   HIM Release of Information Output (Deleted) 06/30/17 Requested records  Documents for the Encounter  AOB (Assignment of Insurance Benefits) Received 08/08/17 PT UNABLE TO SIGN DUE TO CONDITION  E-signature AOB     MEDICARE RIGHTS Received 08/08/17 PT UNABLE TO SIGN DUE TO CONDITIO   E-signature Medicare Rights     ED Patient Billing Extract   ED PB Billing Extract  Cardiac Monitoring Strip Shift Summary Received 08/08/17   Cardiac Monitoring Strip Received 08/08/17   EKG Received 08/09/17   Admission Information   Attending Provider Admitting Provider Admission Type Admission Date/Time  Kari Baars, MD Bobette Mo, MD Emergency 08/08/17 1726  Discharge Date Hospital Service Auth/Cert Status Service Area   Internal Medicine Incomplete [facility]  Unit Room/Bed Admission Status   AP-DEPT 300 A304/A304-01 Admission (Confirmed)   Admission   Complaint  ALERTED MENTAL STATUS  Hospital Account   Name Acct ID Class Status Primary Coverage  Carla Bonilla, Carla Bonilla [id number] Inpatient Open MEDICARE - MEDICARE PART A AND B  Guarantor Account (for Hospital Account [id number])   Name Relation to Pt Service Area Active? Acct Type  Carla Bonilla Self CHSA Yes Personal/Family  Address Phone    [address] [place], [place] [address] [phone](H)        Coverage Information (for Hospital Account [id number])   1. MEDICARE/MEDICARE PART A AND B   F/O Payor/Plan Precert #  MEDICARE/MEDICARE PART A AND  B   Subscriber Subscriber #  Carla Bonilla, Carla Bonilla  Address Phone  [address] [place], [place] [address]   2. AARP/AARP   F/O Payor/Plan Precert #  AARP/AARP   Subscriber Subscriber #  Carla Bonilla, Carla Bonilla [id number]  Address Phone  [address] [place], [place] [address] [phone]

## 2017-08-16 NOTE — Discharge Summary (Signed)
Physician Discharge Summary  Patient ID: Carla Bonilla MRN: [id number] DOB/AGE: 82/05/1920 82 y.o. Primary Care Physician:Shayann Garbutt, Ramon Dredge, MD Admit date: 08/08/2017 Discharge date: 08/16/2017    Discharge Diagnoses:   Principal Problem:   Sepsis due to undetermined organism [facility]) Active Problems:   Hypertension   Glaucoma   Depression with anxiety   Anemia   Hyperlipidemia   Hypomagnesemia   Peripheral neuropathy   Pressure injury of skin   Goals of care, counseling/discussion   Palliative care by specialist   DNR (do not resuscitate) discussion   Dyspnea   Encounter for hospice care discussion Aspiration pneumonia Chronic aspiration syndrome  Allergies as of 08/16/2017   No Known Allergies     Medication List    STOP taking these medications   albuterol 108 (90 Base) MCG/ACT inhaler Commonly known as:  PROVENTIL HFA;VENTOLIN HFA   amLODipine 5 MG tablet Commonly known as:  NORVASC   fluticasone 50 MCG/ACT nasal spray Commonly known as:  FLONASE   furosemide 40 MG tablet Commonly known as:  LASIX   gabapentin 100 MG capsule Commonly known as:  NEURONTIN   ipratropium-albuterol 0.5-2.5 (3) MG/3ML Soln Commonly known as:  DUONEB   losartan 100 MG tablet Commonly known as:  COZAAR   metoprolol tartrate 25 MG tablet Commonly known as:  LOPRESSOR   moxifloxacin 0.5 % ophthalmic solution Commonly known as:  VIGAMOX   ondansetron 4 MG tablet Commonly known as:  ZOFRAN   polyethylene glycol packet Commonly known as:  MIRALAX / GLYCOLAX   sertraline 50 MG tablet Commonly known as:  ZOLOFT   timolol 0.25 % ophthalmic solution Commonly known as:  TIMOPTIC       Discharged Condition: Unchanged    Consults: Palliative care  Significant Diagnostic Studies: Dg Chest Port 1 View  Result Date: 08/09/2017 CLINICAL DATA:  Shortness of breath with agitation EXAM: PORTABLE CHEST 1 VIEW COMPARISON:  08/08/2017, 06/25/2017 FINDINGS: Surgical plate and  multiple screw fixation of the right humerus for old fracture deformity. Stable cardiomediastinal silhouette with vascular congestion. Mild diffuse interstitial opacity consistent with background pulmonary edema. Aortic atherosclerosis. Increasing airspace disease at the left lung base. No pneumothorax. IMPRESSION: 1. Increasing airspace disease at the left lung base, suspect for pneumonia 2. Vascular congestion with mild pulmonary edema. Electronically Signed   By: Jasmine Pang M.D.   On: 08/09/2017 23:44   Dg Chest Portable 1 View  Result Date: 08/08/2017 CLINICAL DATA:  Shortness of breath and weakness since today. Hypertension. EXAM: PORTABLE CHEST 1 VIEW COMPARISON:  06/25/2017 FINDINGS: Right proximal humerus fixation. The Chin overlies the apices, worse on the right. Midline trachea. Normal heart size. Atherosclerosis in the transverse aorta. Mild right hemidiaphragm elevation. No pleural effusion or pneumothorax. Low lung volumes with resultant pulmonary interstitial prominence. Improved right lower lobe aeration with resolved airspace disease. Relatively linear opacity at the left lung base is felt to be new or increased since the prior exam. IMPRESSION: No definite acute cardiopulmonary disease. Resolved right lower lobe airspace disease since 06/25/2017. Relatively linear opacity at the left lung base is favored to represent subsegmental atelectasis or developing scar. Early infection or aspiration could look similar. Depending on clinical symptomatology, follow-up radiographs at 3-5 days could be performed. Aortic Atherosclerosis (ICD10-I70.0). Electronically Signed   By: Jeronimo Greaves M.D.   On: 08/08/2017 18:14    Lab Results: Basic Metabolic Panel: Recent Labs    08/14/17 0443 08/15/17 0431  NA 142  141 139  139  K  3.2*  3.1* 3.2*  3.2*  CL 104  104 101  101  CO2 GLUCOSE 52*  53* 118*  118*  BUN CREATININE 0.99  0.96 0.89  0.89  CALCIUM  8.0*  8.0* 8.1*  8.0*  PHOS 1.8* 1.4*   Liver Function Tests: Recent Labs    08/14/17 0443 08/15/17 0431  ALBUMIN 2.2* 2.2*     CBC: Recent Labs    08/15/17 0914  WBC 6.4  NEUTROABS 3.9  HGB 9.3*  HCT 29.8*  MCV 86.6  PLT 274    Recent Results (from the past 240 hour(s))  Culture, blood (Routine x 2)     Status: None   Collection Time: 08/08/17  5:52 PM  Result Value Ref Range Status   Specimen Description BLOOD LEFT WRIST  Final   Special Requests   Final    BOTTLES DRAWN AEROBIC AND ANAEROBIC Blood Culture results may not be optimal due to an inadequate volume of blood received in culture bottles   Culture   Final    NO GROWTH 5 DAYS Performed at [facility], [address] [place], [place] [address]    Report Status 08/13/2017 FINAL  Final  Culture, blood (Routine x 2)     Status: None   Collection Time: 08/08/17  5:52 PM  Result Value Ref Range Status   Specimen Description BLOOD RIGHT ARM  Final   Special Requests   Final    BOTTLES DRAWN AEROBIC AND ANAEROBIC Blood Culture adequate volume   Culture   Final    NO GROWTH 5 DAYS Performed at [facility], [address], [place], [place] [address]    Report Status 08/13/2017 FINAL  Final  MRSA PCR Screening     Status: None   Collection Time: 08/08/17  9:25 PM  Result Value Ref Range Status   MRSA by PCR NEGATIVE NEGATIVE Final    Comment:        The GeneXpert MRSA Assay (FDA approved for NASAL specimens only), is one component of a comprehensive MRSA colonization surveillance program. It is not intended to diagnose MRSA infection nor to guide or monitor treatment for MRSA infections. Performed at [facility], [address], [place], [place] [address]      Hospital Course: This is a 84 year old who came in to the hospital with altered mental status.  She has had 2 severe strokes and this  left her with severe dysarthria and dysphagia.  She was septic on admission.  This was felt to be related to  aspiration pneumonia which was more visible on repeat chest x-ray.  She was treated with intravenous antibiotics IV fluids but did not improve.  Palliative care consultation was obtained and after discussion it was felt that she was appropriate for inpatient hospice care and she is being transferred.  Discharge Exam: Blood pressure (!) 141/112, pulse (!) 122, temperature 97.9 F (36.6 C), temperature source Oral, resp. rate 16, height  (1.6 m), weight 51.4 kg (113 lb 5.1 oz), SpO2 97 %. She is awake but not very alert.  Chest shows rhonchi bilaterally.  Heart is regular with tachycardia.  Disposition: To hospice inpatient facility      Signed: Emmett Arntz L   08/16/2017, 8:49 AM

## 2017-08-16 NOTE — Plan of Care (Signed)
Continue prescribed regimen

## 2017-09-19 DEATH — deceased
